# Patient Record
Sex: Female | Born: 1945 | Race: Black or African American | Hispanic: No | Marital: Married
Health system: Southern US, Community
[De-identification: ages and names within clinical notes are randomized; demographics above are authoritative.]

## PROBLEM LIST (undated history)

## (undated) ENCOUNTER — Ambulatory Visit: Admission: EM | Payer: Medicare PPO | Source: Home / Self Care

## (undated) DIAGNOSIS — Z87820 Personal history of traumatic brain injury: Secondary | ICD-10-CM

## (undated) DIAGNOSIS — M1990 Unspecified osteoarthritis, unspecified site: Secondary | ICD-10-CM

## (undated) DIAGNOSIS — R079 Chest pain, unspecified: Secondary | ICD-10-CM

## (undated) DIAGNOSIS — I319 Disease of pericardium, unspecified: Secondary | ICD-10-CM

## (undated) DIAGNOSIS — R899 Unspecified abnormal finding in specimens from other organs, systems and tissues: Secondary | ICD-10-CM

## (undated) DIAGNOSIS — R42 Dizziness and giddiness: Secondary | ICD-10-CM

## (undated) DIAGNOSIS — S060X9A Concussion with loss of consciousness of unspecified duration, initial encounter: Secondary | ICD-10-CM

## (undated) DIAGNOSIS — R51 Headache: Principal | ICD-10-CM

## (undated) DIAGNOSIS — G459 Transient cerebral ischemic attack, unspecified: Secondary | ICD-10-CM

## (undated) DIAGNOSIS — T4145XA Adverse effect of unspecified anesthetic, initial encounter: Secondary | ICD-10-CM

## (undated) DIAGNOSIS — I209 Angina pectoris, unspecified: Secondary | ICD-10-CM

## (undated) DIAGNOSIS — D649 Anemia, unspecified: Secondary | ICD-10-CM

## (undated) DIAGNOSIS — R413 Other amnesia: Secondary | ICD-10-CM

## (undated) DIAGNOSIS — T8859XA Other complications of anesthesia, initial encounter: Secondary | ICD-10-CM

## (undated) HISTORY — DX: Other amnesia: R41.3

## (undated) HISTORY — DX: Dizziness and giddiness: R42

## (undated) HISTORY — DX: Personal history of traumatic brain injury: Z87.820

## (undated) HISTORY — PX: TONSILLECTOMY: SUR1361

## (undated) HISTORY — DX: Unspecified abnormal finding in specimens from other organs, systems and tissues: R89.9

## (undated) HISTORY — DX: Chest pain, unspecified: R07.9

## (undated) HISTORY — PX: TUBAL LIGATION: SHX77

## (undated) HISTORY — PX: CYSTOCELE REPAIR: SHX163

## (undated) HISTORY — PX: APPENDECTOMY: SHX54

## (undated) HISTORY — DX: Headache: R51

## (undated) HISTORY — PX: COLONOSCOPY: SHX174

## (undated) HISTORY — PX: CERVICAL CONE BIOPSY: SUR198

## (undated) HISTORY — DX: Concussion with loss of consciousness of unspecified duration, initial encounter: S06.0X9A

---

## 1986-06-07 DIAGNOSIS — G459 Transient cerebral ischemic attack, unspecified: Secondary | ICD-10-CM

## 1986-06-07 HISTORY — DX: Transient cerebral ischemic attack, unspecified: G45.9

## 1998-01-14 ENCOUNTER — Ambulatory Visit (HOSPITAL_BASED_OUTPATIENT_CLINIC_OR_DEPARTMENT_OTHER): Admission: RE | Admit: 1998-01-14 | Discharge: 1998-01-14 | Payer: Self-pay | Admitting: Orthopaedic Surgery

## 1998-12-05 ENCOUNTER — Ambulatory Visit (HOSPITAL_COMMUNITY): Admission: RE | Admit: 1998-12-05 | Discharge: 1998-12-05 | Payer: Self-pay | Admitting: Internal Medicine

## 1999-07-08 ENCOUNTER — Other Ambulatory Visit: Admission: RE | Admit: 1999-07-08 | Discharge: 1999-07-08 | Payer: Self-pay | Admitting: Obstetrics and Gynecology

## 1999-11-24 ENCOUNTER — Ambulatory Visit (HOSPITAL_COMMUNITY): Admission: RE | Admit: 1999-11-24 | Discharge: 1999-11-24 | Payer: Self-pay | Admitting: Gastroenterology

## 1999-12-25 ENCOUNTER — Encounter: Admission: RE | Admit: 1999-12-25 | Discharge: 1999-12-25 | Payer: Self-pay | Admitting: Internal Medicine

## 1999-12-25 ENCOUNTER — Encounter: Payer: Self-pay | Admitting: Internal Medicine

## 2000-04-12 ENCOUNTER — Emergency Department (HOSPITAL_COMMUNITY): Admission: EM | Admit: 2000-04-12 | Discharge: 2000-04-13 | Payer: Self-pay | Admitting: Emergency Medicine

## 2000-04-12 ENCOUNTER — Encounter: Payer: Self-pay | Admitting: Emergency Medicine

## 2000-07-26 ENCOUNTER — Ambulatory Visit (HOSPITAL_COMMUNITY): Admission: RE | Admit: 2000-07-26 | Discharge: 2000-07-26 | Payer: Self-pay | Admitting: Internal Medicine

## 2000-07-26 ENCOUNTER — Encounter: Payer: Self-pay | Admitting: Internal Medicine

## 2000-08-30 ENCOUNTER — Encounter: Payer: Self-pay | Admitting: Internal Medicine

## 2000-08-30 ENCOUNTER — Encounter: Admission: RE | Admit: 2000-08-30 | Discharge: 2000-08-30 | Payer: Self-pay | Admitting: Internal Medicine

## 2000-10-04 ENCOUNTER — Other Ambulatory Visit: Admission: RE | Admit: 2000-10-04 | Discharge: 2000-10-04 | Payer: Self-pay | Admitting: Obstetrics and Gynecology

## 2001-08-08 ENCOUNTER — Ambulatory Visit (HOSPITAL_COMMUNITY): Admission: RE | Admit: 2001-08-08 | Discharge: 2001-08-08 | Payer: Self-pay | Admitting: Internal Medicine

## 2001-08-08 ENCOUNTER — Encounter: Payer: Self-pay | Admitting: Internal Medicine

## 2001-12-28 ENCOUNTER — Encounter: Payer: Self-pay | Admitting: Internal Medicine

## 2001-12-28 ENCOUNTER — Encounter: Admission: RE | Admit: 2001-12-28 | Discharge: 2001-12-28 | Payer: Self-pay | Admitting: Internal Medicine

## 2002-01-01 ENCOUNTER — Other Ambulatory Visit: Admission: RE | Admit: 2002-01-01 | Discharge: 2002-01-01 | Payer: Self-pay | Admitting: Obstetrics and Gynecology

## 2002-08-14 ENCOUNTER — Emergency Department (HOSPITAL_COMMUNITY): Admission: EM | Admit: 2002-08-14 | Discharge: 2002-08-15 | Payer: Self-pay | Admitting: Emergency Medicine

## 2003-01-01 ENCOUNTER — Ambulatory Visit (HOSPITAL_COMMUNITY): Admission: RE | Admit: 2003-01-01 | Discharge: 2003-01-01 | Payer: Self-pay | Admitting: Internal Medicine

## 2003-01-01 ENCOUNTER — Encounter: Payer: Self-pay | Admitting: Internal Medicine

## 2003-03-05 ENCOUNTER — Emergency Department (HOSPITAL_COMMUNITY): Admission: EM | Admit: 2003-03-05 | Discharge: 2003-03-06 | Payer: Self-pay | Admitting: *Deleted

## 2003-03-06 ENCOUNTER — Encounter: Payer: Self-pay | Admitting: Emergency Medicine

## 2003-06-19 ENCOUNTER — Encounter: Admission: RE | Admit: 2003-06-19 | Discharge: 2003-06-19 | Payer: Self-pay | Admitting: Obstetrics and Gynecology

## 2003-07-22 ENCOUNTER — Other Ambulatory Visit: Admission: RE | Admit: 2003-07-22 | Discharge: 2003-07-22 | Payer: Self-pay | Admitting: Obstetrics and Gynecology

## 2005-06-22 ENCOUNTER — Encounter: Admission: RE | Admit: 2005-06-22 | Discharge: 2005-06-22 | Payer: Self-pay | Admitting: Obstetrics and Gynecology

## 2005-09-08 ENCOUNTER — Encounter: Payer: Self-pay | Admitting: Vascular Surgery

## 2005-09-08 ENCOUNTER — Ambulatory Visit (HOSPITAL_COMMUNITY): Admission: RE | Admit: 2005-09-08 | Discharge: 2005-09-08 | Payer: Self-pay | Admitting: Internal Medicine

## 2006-05-05 ENCOUNTER — Ambulatory Visit: Payer: Self-pay | Admitting: Cardiology

## 2006-05-05 ENCOUNTER — Observation Stay (HOSPITAL_COMMUNITY): Admission: EM | Admit: 2006-05-05 | Discharge: 2006-05-06 | Payer: Self-pay | Admitting: Emergency Medicine

## 2006-05-06 ENCOUNTER — Encounter: Payer: Self-pay | Admitting: Cardiology

## 2006-05-16 ENCOUNTER — Ambulatory Visit: Payer: Self-pay | Admitting: Cardiology

## 2006-05-19 ENCOUNTER — Ambulatory Visit: Payer: Self-pay

## 2006-05-20 ENCOUNTER — Ambulatory Visit (HOSPITAL_COMMUNITY): Admission: RE | Admit: 2006-05-20 | Discharge: 2006-05-20 | Payer: Self-pay | Admitting: *Deleted

## 2006-05-23 ENCOUNTER — Ambulatory Visit: Payer: Self-pay | Admitting: Cardiology

## 2006-06-14 ENCOUNTER — Ambulatory Visit: Payer: Self-pay | Admitting: Internal Medicine

## 2007-03-02 ENCOUNTER — Encounter: Admission: RE | Admit: 2007-03-02 | Discharge: 2007-03-02 | Payer: Self-pay | Admitting: *Deleted

## 2007-04-18 ENCOUNTER — Encounter: Admission: RE | Admit: 2007-04-18 | Discharge: 2007-04-18 | Payer: Self-pay | Admitting: Obstetrics and Gynecology

## 2007-10-29 ENCOUNTER — Emergency Department (HOSPITAL_COMMUNITY): Admission: EM | Admit: 2007-10-29 | Discharge: 2007-10-30 | Payer: Self-pay | Admitting: Emergency Medicine

## 2007-11-14 ENCOUNTER — Emergency Department (HOSPITAL_COMMUNITY): Admission: EM | Admit: 2007-11-14 | Discharge: 2007-11-14 | Payer: Self-pay | Admitting: Emergency Medicine

## 2008-05-07 ENCOUNTER — Encounter: Admission: RE | Admit: 2008-05-07 | Discharge: 2008-05-07 | Payer: Self-pay | Admitting: Obstetrics and Gynecology

## 2009-01-15 ENCOUNTER — Ambulatory Visit: Payer: Self-pay | Admitting: Hematology & Oncology

## 2009-03-07 ENCOUNTER — Ambulatory Visit: Payer: Self-pay | Admitting: Hematology & Oncology

## 2009-03-10 LAB — CBC WITH DIFFERENTIAL (CANCER CENTER ONLY)
BASO#: 0 10*3/uL (ref 0.0–0.2)
BASO%: 0.5 % (ref 0.0–2.0)
EOS%: 1.1 % (ref 0.0–7.0)
Eosinophils Absolute: 0 10*3/uL (ref 0.0–0.5)
HCT: 35.7 % (ref 34.8–46.6)
HGB: 11.8 g/dL (ref 11.6–15.9)
LYMPH#: 1.7 10*3/uL (ref 0.9–3.3)
LYMPH%: 59.3 % — ABNORMAL HIGH (ref 14.0–48.0)
MCH: 27.2 pg (ref 26.0–34.0)
MCHC: 33.2 g/dL (ref 32.0–36.0)
MCV: 82 fL (ref 81–101)
MONO#: 0.2 10*3/uL (ref 0.1–0.9)
MONO%: 5.5 % (ref 0.0–13.0)
NEUT#: 0.9 10*3/uL — ABNORMAL LOW (ref 1.5–6.5)
NEUT%: 33.6 % — ABNORMAL LOW (ref 39.6–80.0)
Platelets: 321 10*3/uL (ref 145–400)
RBC: 4.35 10*6/uL (ref 3.70–5.32)
RDW: 14.6 % (ref 10.5–14.6)
WBC: 2.8 10*3/uL — ABNORMAL LOW (ref 3.9–10.0)

## 2009-03-10 LAB — CHCC SATELLITE - SMEAR

## 2009-03-12 LAB — PROTEIN ELECTROPHORESIS, SERUM
Albumin ELP: 53.5 % — ABNORMAL LOW (ref 55.8–66.1)
Alpha-1-Globulin: 6.4 % — ABNORMAL HIGH (ref 2.9–4.9)
Alpha-2-Globulin: 11.6 % (ref 7.1–11.8)
Beta 2: 4.6 % (ref 3.2–6.5)
Beta Globulin: 5.9 % (ref 4.7–7.2)
Gamma Globulin: 18 % (ref 11.1–18.8)
Total Protein, Serum Electrophoresis: 7.1 g/dL (ref 6.0–8.3)

## 2009-03-12 LAB — LACTATE DEHYDROGENASE: LDH: 154 U/L (ref 94–250)

## 2009-03-12 LAB — VITAMIN B12: Vitamin B-12: 1511 pg/mL — ABNORMAL HIGH (ref 211–911)

## 2009-03-12 LAB — ANA: Anti Nuclear Antibody(ANA): NEGATIVE

## 2009-07-11 ENCOUNTER — Ambulatory Visit: Payer: Self-pay | Admitting: Hematology & Oncology

## 2009-07-24 ENCOUNTER — Encounter: Admission: RE | Admit: 2009-07-24 | Discharge: 2009-07-24 | Payer: Self-pay | Admitting: Obstetrics and Gynecology

## 2009-08-12 ENCOUNTER — Ambulatory Visit: Payer: Self-pay | Admitting: Hematology & Oncology

## 2009-08-28 LAB — CBC WITH DIFFERENTIAL (CANCER CENTER ONLY)
BASO#: 0 10*3/uL (ref 0.0–0.2)
BASO%: 0.5 % (ref 0.0–2.0)
EOS%: 1.4 % (ref 0.0–7.0)
Eosinophils Absolute: 0 10*3/uL (ref 0.0–0.5)
HCT: 35.6 % (ref 34.8–46.6)
HGB: 11.5 g/dL — ABNORMAL LOW (ref 11.6–15.9)
LYMPH#: 1.6 10*3/uL (ref 0.9–3.3)
LYMPH%: 64.3 % — ABNORMAL HIGH (ref 14.0–48.0)
MCH: 26.6 pg (ref 26.0–34.0)
MCHC: 32.2 g/dL (ref 32.0–36.0)
MCV: 82 fL (ref 81–101)
MONO#: 0.2 10*3/uL (ref 0.1–0.9)
MONO%: 7.1 % (ref 0.0–13.0)
NEUT#: 0.7 10*3/uL — ABNORMAL LOW (ref 1.5–6.5)
NEUT%: 26.7 % — ABNORMAL LOW (ref 39.6–80.0)
Platelets: 290 10*3/uL (ref 145–400)
RBC: 4.31 10*6/uL (ref 3.70–5.32)
RDW: 13.9 % (ref 10.5–14.6)
WBC: 2.5 10*3/uL — ABNORMAL LOW (ref 3.9–10.0)

## 2009-08-28 LAB — CHCC SATELLITE - SMEAR

## 2009-11-10 ENCOUNTER — Ambulatory Visit: Payer: Self-pay | Admitting: Hematology & Oncology

## 2009-11-12 LAB — CBC WITH DIFFERENTIAL (CANCER CENTER ONLY)
BASO#: 0 10*3/uL (ref 0.0–0.2)
BASO%: 0.3 % (ref 0.0–2.0)
EOS%: 2 % (ref 0.0–7.0)
Eosinophils Absolute: 0.1 10*3/uL (ref 0.0–0.5)
HCT: 34.9 % (ref 34.8–46.6)
HGB: 11.6 g/dL (ref 11.6–15.9)
LYMPH#: 1.5 10*3/uL (ref 0.9–3.3)
LYMPH%: 59.4 % — ABNORMAL HIGH (ref 14.0–48.0)
MCH: 27.5 pg (ref 26.0–34.0)
MCHC: 33.1 g/dL (ref 32.0–36.0)
MCV: 83 fL (ref 81–101)
MONO#: 0.1 10*3/uL (ref 0.1–0.9)
MONO%: 5.1 % (ref 0.0–13.0)
NEUT#: 0.9 10*3/uL — ABNORMAL LOW (ref 1.5–6.5)
NEUT%: 33.2 % — ABNORMAL LOW (ref 39.6–80.0)
Platelets: 307 10*3/uL (ref 145–400)
RBC: 4.21 10*6/uL (ref 3.70–5.32)
RDW: 13.6 % (ref 10.5–14.6)
WBC: 2.6 10*3/uL — ABNORMAL LOW (ref 3.9–10.0)

## 2009-11-12 LAB — CHCC SATELLITE - SMEAR

## 2010-01-29 ENCOUNTER — Telehealth: Payer: Self-pay | Admitting: Internal Medicine

## 2010-01-30 ENCOUNTER — Ambulatory Visit: Payer: Self-pay | Admitting: Internal Medicine

## 2010-02-02 ENCOUNTER — Encounter: Payer: Self-pay | Admitting: Internal Medicine

## 2010-05-11 ENCOUNTER — Ambulatory Visit: Payer: Self-pay | Admitting: Hematology & Oncology

## 2010-06-29 ENCOUNTER — Encounter: Payer: Self-pay | Admitting: Obstetrics and Gynecology

## 2010-07-07 NOTE — Progress Notes (Signed)
Summary: having chest discomfort  Phone Note Call from Patient   Caller: Patient Reason for Call: Talk to Nurse Summary of Call: pt has an appt w dr Tenny Craw 9-9-but since sunday has had some chest discomfort, and when she takes a deep breath has pain-pls advise [phone] Initial call taken by: Glynda Jaeger,  January 29, 2010 3:41 PM  Follow-up for Phone Call        Called patient back...she is complaining of chest pain on and off  sometimes worse with deep inspiration since Sunday and would like to be seen sooner than 9/9. She states that she has a history of "inflammation of the heart" and was seen by cardiology several years ago. I moved up her appointment to tomorrow with Dr.Alveena Taira and cancelled the 9/9 appointment.Marland KitchenMarland KitchenMarland KitchenDiane Simko aware.  Follow-up by: Suzan Garibaldi RN

## 2010-07-07 NOTE — Assessment & Plan Note (Signed)
Summary: chest tightness/discomfort   Visit Type:  Follow-up   History of Present Illness: Patient is a 65 year old with a history of atypcal chest pain. SHe was last in clinic in 2008 [facility] called yesterday complaining of chest pain.   Sunday night she complained of a severe episode of chest pain that occurred in the evening   Not associated with acitvity.  Went away in about 20 min.  Not pleuritic that she could tell. Monday she was ok.  Started pilates.  Since then,esp since Wed she has had the development of SS schest pressure that is worse with inspiration. No cough, no fever.  No increase in reflux. Note she began a cleansing diet last week. Note she had neg stress test a few years ago.  Current Medications (verified): 1)  Vit D3 .... 1tab Qd 2)  Vitamin B Super .... 1tabqd 3)  Flax   Oil (Flaxseed (Linseed)) .... 1tabbqd 4)  Multivitamins   Tabs (Multiple Vitamin) .... 1tabqd 5)  Womens Transition Menopause .... Daily 6)  Florasource Probotic .... Daily 7)  Omega 3 .... Daily  Allergies (verified): No Known Drug Allergies  Past History:  Past Medical History: Chest pain.  Past Surgical History: Tubal ligation Appendectomy  Family History: Father with history of CAD.  Died of CHF at 82.   Mother died of CA at 72  Social History: Married.   No smoking.  No ETOH  Vital Signs:  Patient profile:   64 year old female Height:      66 inches Weight:      189 pounds BMI:     30 .62 Pulse rate:   83 / minute BP sitting:   106 / 77  (left arm) Cuff size:   large  Vitals Entered By: Burnett Kanaris, CNA (January 30, 2010 9:21 AM)  Nutrition Counseling: Patient's BMI is greater than 25 and therefore counseled on weight management options.  Physical Exam  Additional Exam:  Patient is in NAD. HEENT:  Normocephalic, atraumatic. EOMI, PERRLA.  Neck: JVP is normal. No thyromegaly. No bruits.  Lungs: clear to auscultation. No rales no wheezes.  CHest:  tender to  palpiation.  Brings on pain she has been experiencing since Wed. Heart: Regular rate and rhythm. Normal S1, S2. No S3.   No significant murmurs. PMI not displaced.  Abdomen:  Supple, nontender. Normal bowel sounds. No masses. No hepatomegaly.  Extremities:   Good distal pulses throughout. No lower extremity edema.  Musculoskeletal :moving all extremities.  Neuro:   alert and oriented x3.    EKG  Procedure date:  01/30/2010  Findings:      NSR.  78 bpm.  Impression & Recommendations:  Problem # 1:  OTHER CHEST PAIN (ICD-786.59) Patient has no history of CAD.  Patient has had 2 types of chst pain.  One occurred Sunday was severe.  Not assoc with any activity.  Has not had any recurrence.  I would follow.  I am not sure what it represents but I am not convinced it is cardiac. The second type began Wednesday and appears to be musculoskeletal.  She may have strained something with pilates. I have instructed her to not do pilates for a week.  She can take 400 Motrin two times a day.   F/U only if symptoms change or worsen.  Problem # 2:  HEALTH SCREENING (ICD-V70.0) Lipid panel excellent a few years ago.  Other Orders: EKG w/ Interpretation (93000)

## 2010-08-12 ENCOUNTER — Other Ambulatory Visit: Payer: Self-pay | Admitting: Obstetrics and Gynecology

## 2010-08-12 DIAGNOSIS — Z1231 Encounter for screening mammogram for malignant neoplasm of breast: Secondary | ICD-10-CM

## 2010-08-19 ENCOUNTER — Ambulatory Visit
Admission: RE | Admit: 2010-08-19 | Discharge: 2010-08-19 | Disposition: A | Discharge status: Home / Self Care | Payer: BC Managed Care – PPO | Source: Ambulatory Visit | Attending: Obstetrics and Gynecology | Admitting: Obstetrics and Gynecology

## 2010-08-19 DIAGNOSIS — Z1231 Encounter for screening mammogram for malignant neoplasm of breast: Secondary | ICD-10-CM

## 2010-10-23 NOTE — Assessment & Plan Note (Signed)
[facility]                                 ON-CALL NOTE   NAME:[name], [name]                      MRN:          [id number]  DATE:05/10/2006                            DOB:          [birth year]    IDENTIFICATION:  [name] is a woman whom I have spoken with a  couple of times now since her discharge from the hospital on Friday.  She has chest pain which is atypical pleuritic.   She called today and said she is having still continuous 1/10 pain.  Again, it is worse with inspiration.  The patient actually informs me  today that she has an appointment later this afternoon with Dr. Mikey Bussing  at [place], [facility].  I encouraged her to keep this and has already  been in touch with the doctor to let her know what has gone on over the  past few days.     Pricilla Riffle, MD, [facility]  Electronically Signed    PVR/MedQ  DD: 05/10/2006  DT: 05/10/2006  Job #: [id number]

## 2010-10-23 NOTE — Assessment & Plan Note (Signed)
[facility]                            CARDIOLOGY OFFICE NOTE   NAME:[name], Joan                      MRN:          [id number]  DATE:06/14/2006                            DOB:          August 16, 1945    IDENTIFICATION:  [name] is a 65 year old woman whom I saw in the  hospital actually back in December.  She was admitted to [facility] with chest pain.  I felt it was atypical, pleuritic at times,  not associated with exertion.  I told her to take Motrin for a couple of  days to see if it improved.  I followed up on the phone.  She was  continuing to have some problems.  Note, she was due to see Dr.  Particia Jasper at [place], [facility] and she kept the appointment.   In the interval, since I saw her, she actually was seen in cardiology  clinic on December 10.  She also was complaining of some headaches.  She  went ahead and had the Myoview scan done, continued on the Motrin for a  while.  Also set up for a head CT with the headaches.   Myoview scan was done on December 12.  This showed normal perfusion with  mild apical thinning.  EF of 66%.  Head CT was non-contrasted and was  negative.   Since seen, she is now feeling better.  Denies chest pain, headache is  gone.  She took herself off the Motrin and aspirin.  She is resuming her  activities.   CURRENT MEDICATIONS:  __________ .  Life Source vitamins.  Flax seed  oil.  Liquid vitamins and minerals.   PHYSICAL EXAM:  The patient is in no distress.  Blood pressure 132/78, pulse is 60, weight 190.  LUNGS:  Clear.  CARDIAC:  Regular rate and rhythm.  S1, S2.  No S3.  No murmurs.  ABDOMEN:  Benign.  EXTREMITIES:  No edema.  Good distal pulses.   IMPRESSION:  1. Chest pain.  I think again it was probably more muscular +/-      pleuritic in nature.  No evidence of coronary ischemia.  Resolved.      Will not work up further.  2. Health care maintenance.  The patient went through  extensive      evaluation for this chest pain.  Luckily, she is feeling well.   Review of her labs, her hemoglobin A1C was good at 5.9, lipids were  great with LDL of 85 and HDL of 56.   I encouraged her to stay active in a regular walking program.  I will  set followup only p.r.n. if her symptoms change.  The patient was given  copies of her lab results.     Pricilla Riffle, MD, [facility]  Electronically Signed    PVR/MedQ  DD: 06/23/2006  DT: 06/23/2006  Job #: [id number]   cc:   Joan Kinds, MD

## 2010-10-23 NOTE — Op Note (Signed)
[place]  Patient:    [name], [name]                      MRN: [id number] Proc. Date: 11/24/99 Adm. Date:  [id number] Disc. Date: [id number] Attending:  Charna Elizabeth CC:         Sheronette A. Cherly Hensen, M.D.             Norva Pavlov, M.D.                           Operative Report  DATE OF BIRTH:  1945-10-15  REFERRING PHYSICIAN:  Sheronette A. Cherly Hensen, M.D.  PROCEDURE PERFORMED:  Colonoscopy.  ENDOSCOPIST:  Anselmo Rod, M.D.  INSTRUMENT:  Olympus video colonoscope.  INDICATION FOR PROCEDURE:  Blood in stool in a 64 year old black female Rule out for colonic polyps, masses, hemorrhoids, etc.  PREPROCEDURE PREPARATION:  Informed consent was procured from the patient. The patient was fasting for eight hours prior to the procedure and prepped with a bottle of magnesium citrate and a gallon of NuLYTELY the night prior to the procedure.  PREPROCEDURE PHYSICAL:  VITAL SIGNS:  Stable.  NECK:  Supple.  CHEST:  Clear to auscultation.  HEART:  S1, S2 regular.  ABDOMEN:  Soft with normal abdominal bowel sounds.  DESCRIPTION OF THE PROCEDURE:  The patient was placed in the left lateral decubitus position and sedated with 25 mg of Demerol and 2 mg of Versed intravenously.  Once the patient was adequately sedated and maintained on low-flow oxygen and continuous cardiac monitoring, the Olympus video colonoscope was advanced from the rectum to the cecum without difficulty.  No masses, polyps, erosions, ulcerations, or diverticula were seen.  The patient has a healthy-appearing colon.  She tolerated the procedure well without complication.  IMPRESSION:  Normal colonoscopy.  RECOMMENDATIONS:  The patient has been advised to follow up in the office for repeat guaiac testing.  A high-fiber diet has been discussed with her in great detail, and brochures have been given to help education.  Further recommendations will be made on her  next followup visit. DD:  11/24/99 TD:  11/26/99 Job: [id number]

## 2010-10-23 NOTE — Discharge Summary (Signed)
NAMENICHOL, ATOR             ACCOUNT NO.:  [id number]   MEDICAL RECORD NO.:  [id number]          PATIENT TYPE:  INP   LOCATION:  1431                         FACILITY:  [facility]   PHYSICIAN:  Isidor Holts, M.D.  DATE OF BIRTH:  03/14/1946   DATE OF ADMISSION:  05/05/2006  DATE OF DISCHARGE:  05/06/2006                               DISCHARGE SUMMARY   PRIMARY CARE PHYSICIAN:  Unassigned.   DISCHARGE DIAGNOSIS:  Atypical chest pain.   DISCHARGE MEDICATIONS:  1. Aspirin enteric-coated 81 mg p.o. daily.  2. Nitroglycerin 0.4 mg SL p.r.n. q. 5 m.   PROCEDURES:  1. Chest x-ray dated May 05, 2006.  This showed bibasilar      atelectasis Vs airspace disease, left greater than right.  2. Chest CT angiogram dated May 06, 2006.  This showed no      evidence of pulmonary embolus.  Bibasilar atelectasis, left greater      than right.  3. 2 D echocardiogram dated May 06, 2006.  This showed overall      normal left ventricular systolic function.  EF 60%.  No diagnostic      evidence of left ventricular regional wall motion abnormalities.      Normal aortic valve, normal mitral valve.  No pericardial effusion.   CONSULTATIONS:  Dr. Dietrich Pates, cardiologist.   ADMISSION HISTORY:  As in H&P notes of May 05, 2006, dictated by  Dr. Jamison Oka. However, in brief, this is a rather pleasant 65-  year-old female, with no significant past medical history other than  appendectomy and tubal ligation, who presents with a 1 day history of  nagging sternal pain without any radiation, aggravated by movement and  deep inspiration.  She eventually came to the emergency department where  relief was obtained by a combination of aspirin and nitroglycerin.  She  was admitted for further evaluation, investigation, and management.   CLINICAL COURSE:  1. Chest pain.  This is atypical in nature, described as a nagging      pain in the sternal region aggravated by certain kinds of  movements      and also by deep inspiration.  No associated cough.  No      diaphoresis.  No nausea or shortness of breath.  Cardiac enzymes      are cycled, remain unelevated.  EKG showed no acute ischemic      changes.  Lipid profile was excellent with total cholesterol of      152, triglycerides 54, HDL 56, LDL 85.  On suspicion of possible      pulmonary embolism given the fact that the patient recently went on      a road trip to [place] for the Thanksgiving holidays, chest CT      angiogram was done on May 06, 2006, which was negative for      pulmonary embolism.  Also, 2 D echocardiogram showed normal LV      function.  For details, refer to procedure list above.  Cardiology      consultation was called, which was kindly provided by Dr. Gunnar Fusi  Ross.   Per Cardiology recommendations, the patient could benefit from stress  testing.  This has been arranged on an outpatient basis.  As of May 06, 2006, the patient was quite asymptomatic apart from a twinge of  retrosternal pain.   DISPOSITION:  Provided there are no objections from cardiology,  discharge is anticipated on May 06, 2006.   DIET:  Healthy heart diet.   ACTIVITY:  As tolerated.   WOUND CARE:  Not applicable.   PAIN MANAGEMENT:  Not applicable.   FOLLOW UP INSTRUCTIONS:  The patient is to follow up with Dr. Tenny Craw' PA,  telephone number [phone], on May 16, 2006, at 10:30 a.m.  She has  been instructed to establish a primary MD for routine and preventative  care.  Stress Myoview is planned on May 13, 2006, at 10 a.m.  The  patient has been instructed to have nothing to eat after midnight of  May 12, 2006.  These instructions have been communicated to the  patient, and she has verbalized understanding.      Isidor Holts, M.D.  Electronically Signed     CO/MEDQ  D:  05/06/2006  T:  05/06/2006  Job:  [id number]   cc:   Pricilla Riffle, MD, [facility]  [address]  [address]   [place]  [place] [address]

## 2010-10-23 NOTE — H&P (Signed)
NAMECHRISTI, [name]             ACCOUNT NO.:  [id number]   MEDICAL RECORD NO.:  [id number]          PATIENT TYPE:  EMS   LOCATION:  ED                           FACILITY:  [facility]   PHYSICIAN:  Mobolaji B. Bakare, M.D.DATE OF BIRTH:  02-Sep-1945   DATE OF ADMISSION:  05/05/2006  DATE OF DISCHARGE:                              HISTORY & PHYSICAL   CHIEF COMPLAINT:  Chest discomfort.   HISTORY OF PRESENTING COMPLAINT:  [name] is a pleasant 65-year-  old African American female who was in her usual state of health until  this morning at about 5 a.m. when she woke up with chest discomfort.  This was kind of a nagging pain which was sternal.  Does not feel like  heartburn.  This did not stop her from getting dressed and going to  work.  She drove to work and the pain increased with driving and  activity.  There was no radiation.  The pain gets worse with changing  position like bending over.  She did not feel nauseous.  No vomiting.  She had an episode associated with shortness of breath and this did not  last long.  There is no palpitations and no diaphoresis.  This pain was  constant and persistent.  It was 8/10 at its highest.  The patient was  brought to the emergency room by her husband.  She had to leave work  early.  The pain improved with a nitroglycerin patch and aspirin in the  emergency room.  The initial EKG was remarkable for ST elevation,  ischemic changes.  The first set of cardiac markers were unremarkable.   The patient does not smoke cigarettes.  She does not have a history of  diabetes mellitus, hypertension, or hyperlipidemia.  No family history  of premature coronary artery disease.   PAST MEDICAL HISTORY:  None.   PAST SURGICAL HISTORY:  Appendectomy, tubal ligation.   CURRENT MEDICATIONS:  None.   ALLERGIES:  No known drug allergies.   FAMILY HISTORY:  Both parents are deceased.  Father passed away at the  age of 50 from complications of gallbladder  surgery.  Mother passed away  in her 70s.  The patient has one sister who is well and alive.   SOCIAL HISTORY:  She does not smoke cigarettes nor drink alcohol.  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   PHYSICAL EXAMINATION:  VITAL SIGNS:  Temperature 97.9, blood pressure 134/77, pulse 86,  respiratory rate 16, O2 saturation 97%.  GENERAL:  The patient is comfortable not in respiratory distress.  HEENT:  Normocephalic, atraumatic.  Pupils equal and reactive to light.  Extraocular movements intact.  Mucous membranes moist.  No elevated JVD,  no carotid bruit.  LUNGS:  Clear clinically to auscultation.  HEART:  S1 and S2, regular.  ABDOMEN:  Soft, nontender, nondistended, no palpable organomegaly, bowel  sounds present.  EXTREMITIES:  No pedal edema or calf tenderness.  Dorsalis pedes pulses  2+ bilaterally.  NEUROLOGICAL:  No focal neurological deficit.   INITIAL LABORATORY DATA:  White count 3.8, hemoglobin 12.5,  hematocrit  38.3, platelets 287, normal differential.  Sodium 142, potassium 3.8,  chloride 107, bicarb 30, glucose 86, BUN 17, creatinine 0.7, calcium  9.6.  Protime 12.2, INR 0.9.  CK MB 1.6, troponin I less than 0.05.  D-  dimer 0.23.  Chest x-ray pending.   ASSESSMENT/PLAN:  [name] is a 65 year old African American female  presenting with atypical chest pain which was relieved in the emergency  room  by nitroglycerin and aspirin.  The chest pain has reduced from 8  to 2.  The first set of cardiac markers point of care is normal.  EKG is  normal.  The patient is to be admitted to telemetry and treat for  unstable angina, cycle cardiac enzymes q.8h. x3, obtain fasting lipid  profile, hemoglobin A1C, TSH.  Aspirin 325 mg p.o. daily, Lopressor 12.5  mg p.o. b.i.d., nitroglycerin infusion at 5 mcg, Lovenox 1 mg per kg  body weight.  If the patient rules out for MI, we will do stress test to  further evaluate.  2D echocardiogram in a.m.  Protonix 40  mg p.o. daily.      Mobolaji B. Corky Downs, M.D.  Electronically Signed     MBB/MEDQ  D:  05/05/2006  T:  05/05/2006  Job:  [id number]

## 2010-10-23 NOTE — Consult Note (Signed)
[name], SCHUESSLER             ACCOUNT NO.:  [id number]   MEDICAL RECORD NO.:  [id number]          PATIENT TYPE:  OBV   LOCATION:  1431                         [facility]AC[facility]   PHYSICIAN:  Pricilla Riffle, MD, FACCDATE OF BIRTH:  December 11, 1945   DATE OF CONSULTATION:  05/06/2006  DATE OF DISCHARGE:  05/06/2006                                 CONSULTATION   REASON FOR CONSULTATION:  I was asked to see her regarding chest pain.   HISTORY OF PRESENT ILLNESS:  The patient has no known history of  coronary artery disease.  She was admitted on 11/29.  She had woken up  when she began noticing chest discomfort, a squeezing tightness,  question achiness in the left shoulder, 8/10 in intensity.  No  associated shortness of breath.  Hurt to take a deep breath.  Returned  home from work and then presented to the [employer].  In  the ER and since admission, she has had persistent decrease in her chest  pain with nitroglycerin; now 1/10 in intensity, with 0/10 last p.m.  She  denies any exertional limitations.  No productive cough.  Has occasional  reflux.   ALLERGIES:  None.   MEDICATIONS:  Prior to admission herbal medications.  No recent change.   PAST MEDICAL HISTORY:  Negative.   SOCIAL HISTORY:  The patient lives in Home, is married and does  not smoke and does not drink.   FAMILY HISTORY:  History of high cholesterol in sibling, otherwise no  history of CAD.   REVIEW OF SYSTEMS:  All systems reviewed, negative for the above problem  except as noted.   PHYSICAL EXAMINATION:  GENERAL:  The patient was in no acute distress.  VITAL SIGNS:  Blood pressure 115/56, pulse 77, temperature 98.5.  HEENT:  Normocephalic, atraumatic.  EOMI.  PERL.  Throat clear.  Dentition good.  NECK:  JVP is normal.  No bruits.  No thyromegaly.  LUNGS:  Clear to auscultation.  CARDIAC EXAM:  Regular rate and rhythm.  S1, S2, no S3, S4, murmurs or  rubs.  ABDOMEN:  Supple,  nontender.  No hepatomegaly.  EXTREMITIES:  2+ distal pulses no edema.  NEUROLOGIC:  Alert and oriented x3.  Cranial nerves II-XII intact.  Motor 5/5 throughout.   CHEST X-RAY:  Bilateral basilar atelectasis versus air-space disease,  left greater than right.   CHEST CT:  Shows basilar atelectasis.   EKG:  Normal sinus rhythm.  No acute ST changes.   LABS:  Significant for hemoglobin of 12.5, WBC of 3.8, BUN and  creatinine of 17 and 0.7, potassium of 3.8.  CK/MB negative x2.  Troponin negative x2.  Hemoglobin A1c 5.9.  TSH 0.67.   IMPRESSION:  The patient has a history of chest pain that appears very  atypical.  It is pleuritic and prolonged.  She has ruled out for MI.  Workup is negative.   Overall feel low-risk for discharge.  Would treat with aspirin and  nitroglycerin as needed.  Again, feel that it may be possible viral  etiology.  Would contact the patient Monday  if pain continues with  possible outpatient Myoview.  Again, take activity as tolerated over the  weekend.      Pricilla Riffle, MD, [facility]  Electronically Signed     PVR/MEDQ  D:  05/19/2006  T:  05/19/2006  Job:  [id number]

## 2010-11-12 ENCOUNTER — Encounter (HOSPITAL_BASED_OUTPATIENT_CLINIC_OR_DEPARTMENT_OTHER): Payer: BC Managed Care – PPO | Admitting: Hematology & Oncology

## 2010-11-12 ENCOUNTER — Other Ambulatory Visit: Payer: Self-pay | Admitting: Hematology & Oncology

## 2010-11-12 DIAGNOSIS — D72819 Decreased white blood cell count, unspecified: Secondary | ICD-10-CM

## 2010-11-12 LAB — CBC WITH DIFFERENTIAL (CANCER CENTER ONLY)
BASO#: 0.1 10*3/uL (ref 0.0–0.2)
BASO%: 2.7 % — ABNORMAL HIGH (ref 0.0–2.0)
EOS%: 0.4 % (ref 0.0–7.0)
Eosinophils Absolute: 0 10*3/uL (ref 0.0–0.5)
HCT: 35.5 % (ref 34.8–46.6)
HGB: 11.2 g/dL — ABNORMAL LOW (ref 11.6–15.9)
LYMPH#: 1.5 10*3/uL (ref 0.9–3.3)
LYMPH%: 54.9 % — ABNORMAL HIGH (ref 14.0–48.0)
MCH: 25.8 pg — ABNORMAL LOW (ref 26.0–34.0)
MCHC: 31.5 g/dL — ABNORMAL LOW (ref 32.0–36.0)
MCV: 82 fL (ref 81–101)
MONO#: 0.3 10*3/uL (ref 0.1–0.9)
MONO%: 10.6 % (ref 0.0–13.0)
NEUT#: 0.8 10*3/uL — ABNORMAL LOW (ref 1.5–6.5)
NEUT%: 31.4 % — ABNORMAL LOW (ref 39.6–80.0)
Platelets: 310 10*3/uL (ref 145–400)
RBC: 4.34 10*6/uL (ref 3.70–5.32)
RDW: 16.2 % — ABNORMAL HIGH (ref 11.1–15.7)
WBC: 2.6 10*3/uL — ABNORMAL LOW (ref 3.9–10.0)

## 2010-11-12 LAB — CHCC SATELLITE - SMEAR

## 2011-09-14 ENCOUNTER — Other Ambulatory Visit: Payer: Self-pay | Admitting: Internal Medicine

## 2011-09-14 DIAGNOSIS — Z1231 Encounter for screening mammogram for malignant neoplasm of breast: Secondary | ICD-10-CM

## 2011-10-27 ENCOUNTER — Ambulatory Visit: Payer: Medicare Other

## 2011-12-28 ENCOUNTER — Ambulatory Visit
Admission: RE | Admit: 2011-12-28 | Discharge: 2011-12-28 | Disposition: A | Discharge status: Home / Self Care | Payer: Medicare Other | Source: Ambulatory Visit | Attending: Internal Medicine | Admitting: Internal Medicine

## 2011-12-28 DIAGNOSIS — Z1231 Encounter for screening mammogram for malignant neoplasm of breast: Secondary | ICD-10-CM

## 2012-03-03 ENCOUNTER — Encounter: Payer: Self-pay | Admitting: Internal Medicine

## 2013-02-12 ENCOUNTER — Emergency Department (HOSPITAL_COMMUNITY): Payer: Medicare Other

## 2013-02-12 ENCOUNTER — Encounter (HOSPITAL_COMMUNITY): Payer: Self-pay | Admitting: Cardiology

## 2013-02-12 ENCOUNTER — Emergency Department (HOSPITAL_COMMUNITY)
Admission: EM | Admit: 2013-02-12 | Discharge: 2013-02-12 | Disposition: A | Discharge status: Home / Self Care | Payer: Medicare Other | Attending: Emergency Medicine | Admitting: Emergency Medicine

## 2013-02-12 DIAGNOSIS — R0789 Other chest pain: Secondary | ICD-10-CM | POA: Insufficient documentation

## 2013-02-12 DIAGNOSIS — Z79899 Other long term (current) drug therapy: Secondary | ICD-10-CM | POA: Insufficient documentation

## 2013-02-12 LAB — BASIC METABOLIC PANEL
BUN: 16 mg/dL (ref 6–23)
CO2: 25 mEq/L (ref 19–32)
Calcium: 9.9 mg/dL (ref 8.4–10.5)
Chloride: 104 mEq/L (ref 96–112)
Creatinine, Ser: 0.68 mg/dL (ref 0.50–1.10)
GFR calc Af Amer: >90 mL/min (ref >90)
GFR calc non Af Amer: 89 mL/min — ABNORMAL LOW (ref >90)
Glucose, Bld: 85 mg/dL (ref 70–99)
Potassium: 5.3 mEq/L — ABNORMAL HIGH (ref 3.5–5.1)
Sodium: 138 mEq/L (ref 135–145)

## 2013-02-12 LAB — CBC
HCT: 35.3 % — ABNORMAL LOW (ref 36.0–46.0)
Hemoglobin: 11.1 g/dL — ABNORMAL LOW (ref 12.0–15.0)
MCH: 26.1 pg (ref 26.0–34.0)
MCHC: 31.4 g/dL (ref 30.0–36.0)
MCV: 83.1 fL (ref 78.0–100.0)
Platelets: 249 10*3/uL (ref 150–400)
RBC: 4.25 MIL/uL (ref 3.87–5.11)
RDW: 17.1 % — ABNORMAL HIGH (ref 11.5–15.5)
WBC: 2.6 10*3/uL — ABNORMAL LOW (ref 4.0–10.5)

## 2013-02-12 LAB — POCT I-STAT TROPONIN I: Troponin i, poc: 0 ng/mL (ref 0.00–0.08)

## 2013-02-12 MED ORDER — KETOROLAC TROMETHAMINE 30 MG/ML IJ SOLN
30.0000 mg | Freq: Once | INTRAMUSCULAR | Status: AC
  Administered 2013-02-12: 30 mg via INTRAVENOUS
  Filled 2013-02-12: qty 1

## 2013-02-12 MED ORDER — ASPIRIN EC 325 MG PO TBEC
325.0000 mg | DELAYED_RELEASE_TABLET | Freq: Once | ORAL | Status: AC
  Administered 2013-02-12: 325 mg via ORAL
  Filled 2013-02-12: qty 1

## 2013-02-12 NOTE — ED Notes (Signed)
Pt reports pain in the top part of her chest. States that it has been there a couple of weeks, but she recently started a new work out routine. Denies any SOB or N/v, but does report pressure. No cardiac hx.

## 2013-02-12 NOTE — ED Provider Notes (Signed)
CSN: [id number]     Arrival date & time 02/12/13  1445 History   First MD Initiated Contact with Patient 02/12/13 1652     Chief Complaint  Patient presents with  . Chest Pain   (Consider location/radiation/quality/duration/timing/severity/associated sxs/prior Treatment) HPI This is a 67 year old female with no significant past medical history 2 presents with chest pain. The patient reports 2 weeks of chest "soreness" and tightness. She states that the soreness comes and goes. She started a new exercise routine 2 weeks ago and thinks this may have contributed to her symptoms. Her pain is not worse with exercising. She states that Saturday night she had continuous pain all night. She does not like to take medication and has not taken anything for pain.  She denies any shortness of breath or diaphoresis. She states that the pain is worse when she coughs or sneezes. It is also worse with movement. She states that she had similar pain several years ago and was evaluated. Chart reveals evaluation in 2007 for atypical chest pain. She was referred for outpatient stress testing at that time. Patient denies any fevers, cough, headache, abdominal pain, urinary symptoms, focal weakness or numbness. Past Medical History  Diagnosis Date  . Chest pain    Past Surgical History  Procedure Laterality Date  . Tubal ligation    . Appendectomy     Family History  Problem Relation Age of Onset  . Coronary artery disease    . Heart disease Father   . Heart failure Father    History  Substance Use Topics  . Smoking status: Never Smoker   . Smokeless tobacco: Not on file  . Alcohol Use: No   OB History   Grav Para Term Preterm Abortions TAB SAB Ect Mult Living                 Review of Systems  Constitutional: Negative for fever.  Respiratory: Positive for chest tightness. Negative for cough and shortness of breath.   Cardiovascular: Positive for chest pain. Negative for palpitations and leg swelling.   Gastrointestinal: Negative for nausea, vomiting and abdominal pain.  Genitourinary: Negative for dysuria.  Musculoskeletal: Negative for back pain.  Skin: Negative for rash.  Neurological: Negative for dizziness and syncope.  Psychiatric/Behavioral: The patient is not nervous/anxious.   All other systems reviewed and are negative.    Allergies  Review of patient's allergies indicates no known allergies.  Home Medications   Current Outpatient Rx  Name  Route  Sig  Dispense  Refill  . Ascorbic Acid (VITAMIN C) 1000 MG tablet   Oral   Take 1,000 mg by mouth daily.         . Cholecalciferol (VITAMIN D-3 PO)   Oral   Take by mouth.         . cyanocobalamin 1000 MCG tablet   Oral   Take 100 mcg by mouth daily.         . Flaxseed, Linseed, (FLAX SEEDS PO)   Oral   Take by mouth.         . Nutritional Supplements (WOMENS FORMULA MENOPAUSE PO)   Oral   Take by mouth.          BP 103/61  Pulse 72  Temp(Src) 97.6 F (36.4 C) (Oral)  Resp 17  Ht 5\' 6"  (1.676 m)  Wt 162 lb (73.483 kg)  BMI 26.16 kg/m2  SpO2 100% Physical Exam  Nursing note and vitals reviewed. Constitutional: She is oriented to  person, place, and time. She appears well-developed and well-nourished.  HENT:  Head: Normocephalic and atraumatic.  Eyes: Pupils are equal, round, and reactive to light.  Neck: Neck supple.  Cardiovascular: Normal rate, regular rhythm and normal heart sounds.   Pulmonary/Chest: Effort normal and breath sounds normal. No respiratory distress. She has no wheezes. She exhibits tenderness.  Tenderness to palpation of the anterior chest wall  Abdominal: Soft. Bowel sounds are normal. There is no tenderness.  Musculoskeletal: She exhibits no edema.  Neurological: She is alert and oriented to person, place, and time.  Skin: Skin is warm and dry.  Psychiatric: She has a normal mood and affect.    ED Course  Procedures (including critical care time) Labs Review Labs  Reviewed  CBC - Abnormal; Notable for the following:    WBC 2.6 (*)    Hemoglobin 11.1 (*)    HCT 35.3 (*)    RDW 17.1 (*)    All other components within normal limits  BASIC METABOLIC PANEL - Abnormal; Notable for the following:    Potassium 5.3 (*)    GFR calc non Af Amer 89 (*)    All other components within normal limits  POCT I-STAT TROPONIN I   Imaging Review Dg Chest 2 View  02/12/2013   *RADIOLOGY REPORT*  Clinical Data: Chest pain  CHEST - 2 VIEW  Comparison: Chest radiograph 05/05/2006  Findings: Stable cardiac and mediastinal contours.  Minimal basilar atelectasis and/or scarring.  No consolidative pulmonary opacities. No pleural effusion or pneumothorax.  Regional skeleton is unremarkable.  IMPRESSION: No acute cardiopulmonary process.   Original Report Authenticated By: Annia Belt, M.D   EKG independently reviewed by myself:  Normal sinus rhythm with a rate of 78, no evidence of ST elevation or ischemia  MDM   1. Atypical chest pain    This is a 67 year old female who presents with 2 weeks of chest pain. She is nontoxic-appearing on exam and her vital signs are within normal limits. Chest pain is reproducible on exam. Patient was given full dose aspirin and Toradol. Basic labwork was obtained. Lab work notable for a potassium of 5.3 with hemolysis. Troponin is negative. Chest x-ray is negative. Patient's pain was improved with Toradol. Chart review reveals that she was worked up in 2007 with an inpatient stay and outpatient stress by cardiology. Stress test results were not obtainable. At this time I feel her pain is very atypical for ACS given that it is reproducible and has been one over two weeks. Given her age, however, I feel that she does warrant outpatient workup and stress testing. She was given the cardiology followup information. She was given strict return cautions.  After history, exam, and medical workup I feel the patient has been appropriately medically screened  and is safe for discharge home. Pertinent diagnoses were discussed with the patient. Patient was given return precautions.   Shon Baton, MD 02/12/13 [phone]

## 2013-02-26 ENCOUNTER — Ambulatory Visit (INDEPENDENT_AMBULATORY_CARE_PROVIDER_SITE_OTHER): Payer: Medicare Other | Admitting: Internal Medicine

## 2013-02-26 ENCOUNTER — Encounter: Payer: Self-pay | Admitting: Internal Medicine

## 2013-02-26 VITALS — BP 106/82 | HR 61 | Ht 66.0 in | Wt 166.0 lb

## 2013-02-26 DIAGNOSIS — R002 Palpitations: Secondary | ICD-10-CM

## 2013-02-26 DIAGNOSIS — R0602 Shortness of breath: Secondary | ICD-10-CM

## 2013-02-26 DIAGNOSIS — R079 Chest pain, unspecified: Secondary | ICD-10-CM

## 2013-02-26 NOTE — Progress Notes (Signed)
HPI  Patient is a 67 yo who presents for evaluation of CP  The symptoms begain about 2 wks ago.  Pressure/tightness in chest.  OVer past few days has develped SOB, a fluttering sensation in chest, fatigue She was seen in [facility] ER 9/8  Had continuous CP  Pain was worse with cough  No sputum production Pain on Sat lasted all night. No worseninng with physcial activity She was d/c'd from ER  Continues to have similar symptoms.   Note she was seen in cardiology clinic about 7 years ago for similar chest pains.   Past Medical History      No Known Allergies  Current Outpatient Prescriptions  Medication Sig Dispense Refill  . Ascorbic Acid (VITAMIN C) 1000 MG tablet Take 1,000 mg by mouth daily.      . Cholecalciferol (VITAMIN D-3 PO) Take by mouth.      . cyanocobalamin 1000 MCG tablet Take 100 mcg by mouth daily.      . Nutritional Supplements (WOMENS FORMULA MENOPAUSE PO) Take by mouth.       No current facility-administered medications for this visit.    Past Medical History  Diagnosis Date  . Chest pain     Past Surgical History  Procedure Laterality Date  . Tubal ligation    . Appendectomy      Family History  Problem Relation Age of Onset  . Coronary artery disease    . Heart disease Father   . Heart failure Father     History   Social History  . Marital Status: Married    Spouse Name: N/A    Number of Children: N/A  . Years of Education: N/A   Occupational History  . Not on file.   Social History Main Topics  . Smoking status: Never Smoker   . Smokeless tobacco: Not on file  . Alcohol Use: No  . Drug Use: No  . Sexual Activity: Not on file   Other Topics Concern  . Not on file   Social History Narrative  . No narrative on file    Review of Systems:  All systems reviewed.  They are negative to the above problem except as previously stated.  Vital Signs: BP 106/82  Pulse 61  Ht 5\' 6"  (1.676 m)  Wt 166 lb (75.297 kg)  BMI 26.81 kg/m2   SpO2 99%  Physical Exam Patient is in NAD HEENT:  Normocephalic, atraumatic. EOMI, PERRLA.  Neck: JVP is normal.  No bruits.  Lungs: clear to auscultation. No rales no wheezes.  Heart: Regular rate and rhythm. Normal S1, S2. No S3.   No significant murmurs. PMI not displaced.  Abdomen:  Supple, nontender. Normal bowel sounds. No masses. No hepatomegaly.  Extremities:   Good distal pulses throughout. No lower extremity edema.  Musculoskeletal :moving all extremities.  Neuro:   alert and oriented x3.  CN II-XII grossly intact.   Assessment and Plan:  1.  CP  Pain is atypical  I do not think it is cardiac.  Would follow  May be more muscular.  2.  SOB  Will set up for echo to evaluate diastolic function  3.  Palpitations  Follow for now.

## 2013-02-26 NOTE — Patient Instructions (Addendum)
Your physician has requested that you have an echocardiogram. Echocardiography is a painless test that uses sound waves to create images of your heart. It provides your doctor with information about the size and shape of your heart and how well your heart's chambers and valves are working. This procedure takes approximately one hour. There are no restrictions for this procedure.   

## 2013-03-08 ENCOUNTER — Other Ambulatory Visit (HOSPITAL_COMMUNITY): Payer: Medicare Other

## 2013-03-12 ENCOUNTER — Ambulatory Visit (HOSPITAL_COMMUNITY): Payer: Medicare Other | Attending: Internal Medicine | Admitting: Radiology

## 2013-03-12 ENCOUNTER — Other Ambulatory Visit (HOSPITAL_COMMUNITY): Payer: Self-pay | Admitting: Internal Medicine

## 2013-03-12 DIAGNOSIS — R0789 Other chest pain: Secondary | ICD-10-CM

## 2013-03-12 DIAGNOSIS — R002 Palpitations: Secondary | ICD-10-CM

## 2013-03-12 DIAGNOSIS — R072 Precordial pain: Secondary | ICD-10-CM

## 2013-03-12 DIAGNOSIS — R0602 Shortness of breath: Secondary | ICD-10-CM

## 2013-03-12 DIAGNOSIS — I059 Rheumatic mitral valve disease, unspecified: Secondary | ICD-10-CM | POA: Insufficient documentation

## 2013-03-12 NOTE — Progress Notes (Signed)
Echocardiogram performed.  

## 2013-03-14 ENCOUNTER — Telehealth: Payer: Self-pay | Admitting: Cardiology

## 2013-03-14 DIAGNOSIS — R002 Palpitations: Secondary | ICD-10-CM

## 2013-03-14 NOTE — Telephone Encounter (Signed)
Follow up   Returned  [name]'s call

## 2013-03-14 NOTE — Telephone Encounter (Signed)
Spoke with pt, she is aware of normal echo results. She wants me to let dr Tenny Craw know while in the dermatology office this week they told her that her pulse was 169. Also she has felt her heart race today. Will make dr Tenny Craw aware

## 2013-03-15 NOTE — Telephone Encounter (Signed)
Spoke with pt, aware of dr Tenny Craw recommendations. Will place order and have [facility] call pt to schedule.

## 2013-03-15 NOTE — Telephone Encounter (Signed)
Please set up for event monitor 

## 2013-03-16 ENCOUNTER — Other Ambulatory Visit: Payer: Self-pay

## 2013-03-16 DIAGNOSIS — Z1231 Encounter for screening mammogram for malignant neoplasm of breast: Secondary | ICD-10-CM

## 2013-03-19 ENCOUNTER — Other Ambulatory Visit: Payer: Self-pay | Admitting: Obstetrics and Gynecology

## 2013-03-19 DIAGNOSIS — M8580 Other specified disorders of bone density and structure, unspecified site: Secondary | ICD-10-CM

## 2013-03-19 DIAGNOSIS — R29890 Loss of height: Secondary | ICD-10-CM

## 2013-03-29 ENCOUNTER — Encounter: Payer: Self-pay | Admitting: *Deleted

## 2013-03-29 DIAGNOSIS — R002 Palpitations: Secondary | ICD-10-CM

## 2013-03-29 NOTE — Progress Notes (Signed)
Patient ID: [name], female   DOB: 02/14/1946, 67 y.o.   MRN: [id number] [employer] verite 30 day cardiac event monitor applied to patient.

## 2013-03-30 ENCOUNTER — Telehealth: Payer: Self-pay | Admitting: Radiology

## 2013-03-30 NOTE — Telephone Encounter (Signed)
Pt had monitor placed yesterday she called this morning stating the monitor was a "nuisance" and will not work for her. I instructed her the importance of the monitor but to send back if she wasnt going to wear it.

## 2013-04-03 ENCOUNTER — Telehealth: Payer: Self-pay | Admitting: Internal Medicine

## 2013-04-03 NOTE — Telephone Encounter (Signed)
New message    Talk to [facility] a monitor and have questions

## 2013-04-12 ENCOUNTER — Ambulatory Visit
Admission: RE | Admit: 2013-04-12 | Discharge: 2013-04-12 | Disposition: A | Discharge status: Home / Self Care | Payer: 59 | Source: Ambulatory Visit | Attending: Nurse Practitioner | Admitting: Nurse Practitioner

## 2013-04-12 ENCOUNTER — Ambulatory Visit: Payer: Medicare Other

## 2013-04-12 ENCOUNTER — Other Ambulatory Visit: Payer: Self-pay | Admitting: Nurse Practitioner

## 2013-04-12 DIAGNOSIS — S0990XA Unspecified injury of head, initial encounter: Secondary | ICD-10-CM

## 2013-04-12 DIAGNOSIS — R519 Headache, unspecified: Secondary | ICD-10-CM

## 2013-04-16 ENCOUNTER — Telehealth: Payer: Self-pay | Admitting: Internal Medicine

## 2013-04-16 NOTE — Telephone Encounter (Signed)
New Problem:  Pt states she got a heart monitor on the 23rd and has not worn it yet. Pt states she has had a rough time. Pt states she is dealing with her son's passing and she recently fell in vacation in [place] and was dealing with a concussion. Pt would like to be advised on what she would do with the heart monitor since she has not started it yet.

## 2013-04-16 NOTE — Telephone Encounter (Signed)
Left message for pt to call.

## 2013-04-17 NOTE — Telephone Encounter (Signed)
Follow Up:  Pt is returning Debra's call.

## 2013-04-17 NOTE — Telephone Encounter (Signed)
Left pt a message to call back. 

## 2013-04-18 NOTE — Telephone Encounter (Signed)
Spoke with patient who states she has had extenuating circumstances and would like to postpone the wearing of the monitor.  I advised patient to return the monitor to the office.  Patient had a question about the billing for the monitor and I attempted to call Andee Lineman to ask her but was unable to get [place] on the phone.  I advised patient to ask for [place] or Orpha Bur when she comes here to return the monitor.  Patient verbalized understanding and thanked me for my help.

## 2013-04-19 ENCOUNTER — Encounter: Payer: Self-pay | Admitting: *Deleted

## 2013-04-19 NOTE — Progress Notes (Signed)
Patient ID: [name], female   DOB: 1946/01/23, 67 y.o.   MRN: [id number] Patient has made several calls regarding her [employer] 30 day cardiac event monitor.  The monitor was applied to the patient and a baseline recording sent 03/29/2013.  This was the only recording sent to [facility]. The patient has not been wearing her monitor due to the unexpected recent death of her 60 year old son, the subsequent trip to [place] for his memorial service, and an accidental fall in [place] sending her to the hospital with a concussion.  I spoke with our representative, Fatima Blank, at [employer].  [employer] will cancel monitoring charges for [name] if we fax a letter, signed by Dr. Tenny Craw, to their billing department.  The letter should include, patient name, date of birth, above listed circumstances, and a statement that [facility], will not charge any  fees for the same cardiac event monitor placed 03/29/2013.  This information was given to Deliah Goody, RN, who will present it to Dr. Tenny Craw 04/20/2013.

## 2013-04-19 NOTE — Telephone Encounter (Signed)
forwared this to [facility] in Monitor department. fyi refer to telephone msg on 03-30-13.

## 2013-04-20 ENCOUNTER — Encounter: Payer: Self-pay | Admitting: *Deleted

## 2013-04-20 NOTE — Progress Notes (Signed)
Letter generated and faxed to [employer] at [phone]. Pt make aware

## 2013-05-02 ENCOUNTER — Encounter (HOSPITAL_COMMUNITY): Payer: Self-pay | Admitting: Emergency Medicine

## 2013-05-02 ENCOUNTER — Emergency Department (HOSPITAL_COMMUNITY)
Admission: EM | Admit: 2013-05-02 | Discharge: 2013-05-02 | Disposition: A | Discharge status: Home / Self Care | Payer: Medicare Other | Attending: Emergency Medicine | Admitting: Emergency Medicine

## 2013-05-02 ENCOUNTER — Emergency Department (HOSPITAL_COMMUNITY): Payer: Medicare Other

## 2013-05-02 DIAGNOSIS — W07XXXA Fall from chair, initial encounter: Secondary | ICD-10-CM | POA: Insufficient documentation

## 2013-05-02 DIAGNOSIS — R42 Dizziness and giddiness: Secondary | ICD-10-CM

## 2013-05-02 DIAGNOSIS — S060X0A Concussion without loss of consciousness, initial encounter: Secondary | ICD-10-CM | POA: Insufficient documentation

## 2013-05-02 DIAGNOSIS — Y939 Activity, unspecified: Secondary | ICD-10-CM | POA: Insufficient documentation

## 2013-05-02 DIAGNOSIS — H538 Other visual disturbances: Secondary | ICD-10-CM | POA: Insufficient documentation

## 2013-05-02 DIAGNOSIS — Z8679 Personal history of other diseases of the circulatory system: Secondary | ICD-10-CM | POA: Insufficient documentation

## 2013-05-02 DIAGNOSIS — Y929 Unspecified place or not applicable: Secondary | ICD-10-CM | POA: Insufficient documentation

## 2013-05-02 LAB — URINALYSIS, ROUTINE W REFLEX MICROSCOPIC
Bilirubin Urine: NEGATIVE
Glucose, UA: NEGATIVE mg/dL
Hgb urine dipstick: NEGATIVE
Ketones, ur: NEGATIVE mg/dL
Leukocytes, UA: NEGATIVE
Nitrite: NEGATIVE
Protein, ur: NEGATIVE mg/dL
Specific Gravity, Urine: 1.01 (ref 1.005–1.030)
Urobilinogen, UA: 0.2 mg/dL (ref 0.0–1.0)
pH: 7.5 (ref 5.0–8.0)

## 2013-05-02 LAB — CBC
HCT: 35.8 % — ABNORMAL LOW (ref 36.0–46.0)
Hemoglobin: 11.3 g/dL — ABNORMAL LOW (ref 12.0–15.0)
MCH: 26.4 pg (ref 26.0–34.0)
MCHC: 31.6 g/dL (ref 30.0–36.0)
MCV: 83.6 fL (ref 78.0–100.0)
Platelets: 271 10*3/uL (ref 150–400)
RBC: 4.28 MIL/uL (ref 3.87–5.11)
RDW: 16.2 % — ABNORMAL HIGH (ref 11.5–15.5)
WBC: 2.4 10*3/uL — ABNORMAL LOW (ref 4.0–10.5)

## 2013-05-02 LAB — BASIC METABOLIC PANEL
BUN: 9 mg/dL (ref 6–23)
CO2: 32 mEq/L (ref 19–32)
Calcium: 10.3 mg/dL (ref 8.4–10.5)
Chloride: 106 mEq/L (ref 96–112)
Creatinine, Ser: 0.72 mg/dL (ref 0.50–1.10)
GFR calc Af Amer: >90 mL/min (ref >90)
GFR calc non Af Amer: 87 mL/min — ABNORMAL LOW (ref >90)
Glucose, Bld: 67 mg/dL — ABNORMAL LOW (ref 70–99)
Potassium: 4.1 mEq/L (ref 3.5–5.1)
Sodium: 143 mEq/L (ref 135–145)

## 2013-05-02 LAB — CG4 I-STAT (LACTIC ACID): Lactic Acid, Venous: 1.51 mmol/L (ref 0.5–2.2)

## 2013-05-02 MED ORDER — KETOROLAC TROMETHAMINE 30 MG/ML IJ SOLN
30.0000 mg | Freq: Once | INTRAMUSCULAR | Status: AC
  Administered 2013-05-02: 30 mg via INTRAVENOUS
  Filled 2013-05-02: qty 1

## 2013-05-02 MED ORDER — IBUPROFEN 600 MG PO TABS: 600.0000 mg | ORAL_TABLET | Freq: Three times a day (TID) | ORAL | Status: DC

## 2013-05-02 MED ORDER — SODIUM CHLORIDE 0.9 % IV BOLUS (SEPSIS)
1000.0000 mL | Freq: Once | INTRAVENOUS | Status: AC
  Administered 2013-05-02: 1000 mL via INTRAVENOUS

## 2013-05-02 MED ORDER — ONDANSETRON HCL 4 MG PO TABS: 4.0000 mg | ORAL_TABLET | Freq: Four times a day (QID) | ORAL | Status: DC

## 2013-05-02 MED ORDER — ONDANSETRON HCL 4 MG/2ML IJ SOLN
4.0000 mg | Freq: Once | INTRAMUSCULAR | Status: AC
  Administered 2013-05-02: 4 mg via INTRAVENOUS
  Filled 2013-05-02: qty 2

## 2013-05-02 NOTE — ED Notes (Signed)
NOTIFIED DR. WARD IN PERSON OF PATIENTS LAB RESULTS OF LACTIC ACID CG4 = 1.50mmoI/L.

## 2013-05-02 NOTE — ED Notes (Signed)
Spoke with MRI, Pt has one person ahead of her, will be about 45 minutes

## 2013-05-02 NOTE — ED Notes (Signed)
Pt was prescribed pain medication, took it for only 9 days. Is not currently taking pain medication at this time.

## 2013-05-02 NOTE — ED Notes (Signed)
MRI contacted me stating that it will be another 30 minutes before pt taken to MRI

## 2013-05-02 NOTE — ED Notes (Signed)
Patient transported to MR. 

## 2013-05-02 NOTE — ED Notes (Signed)
Patient states she is still unable to give a urine sample. 

## 2013-05-02 NOTE — ED Notes (Signed)
MD at bedside. 

## 2013-05-02 NOTE — ED Provider Notes (Signed)
TIME SEEN: 12:22 PM  CHIEF COMPLAINT: Headache, vertigo, left eye vision changes  HPI: Patient is a 67 year old female with no significant past medical history who presents to the emergency department with complaints of intermittent gradual onset, severe, left-sided posterior, sharp headaches that radiates into her left face with left-sided vision changes and vertigo for the past month. She denies any aggravating or alleviating factors. She reports that approximately one month ago she slipped out of a chair and struck her head on a bathtub. There was no loss of consciousness. She's been managed by her primary care physician, Dr. Renae Gloss, and has tried taking Topamax and over-the-counter anti-inflammatories without relief of symptoms. She feels as though the pain is incapacitating. She denies any numbness or focal weakness. No hearing loss or tinnitus. She is not on anticoagulation. She did have a CT of her head after the fall which was negative per her report. She was sent here to the emergency department for her primary care physician for further evaluation and a brain MRI given her persistent symptoms.  ROS: See HPI Constitutional: no fever  Eyes: no drainage  ENT: no runny nose   Cardiovascular:  no chest pain  Resp: no SOB  GI: no vomiting GU: no dysuria Integumentary: no rash  Allergy: no hives  Musculoskeletal: no leg swelling  Neurological: no slurred speech ROS otherwise negative  PAST MEDICAL HISTORY/PAST SURGICAL HISTORY:  Past Medical History  Diagnosis Date  . Chest pain     MEDICATIONS:  Prior to Admission medications   Medication Sig Start Date End Date Taking? Authorizing Provider  Ascorbic Acid (VITAMIN C) 1000 MG tablet Take 1,000 mg by mouth daily.    Historical Provider, MD  Cholecalciferol (VITAMIN D-3 PO) Take by mouth.    Historical Provider, MD  cyanocobalamin 1000 MCG tablet Take 100 mcg by mouth daily.    Historical Provider, MD  Nutritional Supplements  (WOMENS FORMULA MENOPAUSE PO) Take by mouth.    Historical Provider, MD    ALLERGIES:  No Known Allergies  SOCIAL HISTORY:  History  Substance Use Topics  . Smoking status: Never Smoker   . Smokeless tobacco: Not on file  . Alcohol Use: No    FAMILY HISTORY: Family History  Problem Relation Age of Onset  . Coronary artery disease    . Heart disease Father   . Heart failure Father     EXAM: BP 120/76  Pulse 82  Temp(Src) 97.4 F (36.3 C) (Oral)  Resp 20  Ht 5\' 6"  (1.676 m)  Wt 167 lb 4.8 oz (75.887 kg)  BMI 27.02 kg/m2  SpO2 100% CONSTITUTIONAL: Alert and oriented and responds appropriately to questions. Well-appearing; well-nourished HEAD: Normocephalic EYES: Conjunctivae clear, PERRL ENT: normal nose; no rhinorrhea; moist mucous membranes; pharynx without lesions noted NECK: Supple, no meningismus, no LAD; no midline spinal tenderness, step-off or deformity CARD: RRR; S1 and S2 appreciated; no murmurs, no clicks, no rubs, no gallops RESP: Normal chest excursion without splinting or tachypnea; breath sounds clear and equal bilaterally; no wheezes, no rhonchi, no rales,  ABD/GI: Normal bowel sounds; non-distended; soft, non-tender, no rebound, no guarding BACK:  The back appears normal and is non-tender to palpation, there is no CVA tenderness; no midline spinal tenderness, step-off or deformity EXT: Normal ROM in all joints; non-tender to palpation; no edema; normal capillary refill; no cyanosis    SKIN: Normal color for age and race; warm NEURO: Moves all extremities equally; strength 5/5 in all 4 extremities, patient does have  mild fatigable horizontal nystagmus on exam; cranial nerves II through XII intact, sensation to light touch intact diffusely, no dysmetria to finger to nose testing PSYCH: The patient's mood and manner are appropriate. Grooming and personal hygiene are appropriate.  MEDICAL DECISION MAKING: Patient here with likely postconcussive symptoms but  given her vertigo and left vision changes with posterior headache, will obtain MRI to rule out contusion, infarct.  Will also obtain basic labs and give IV fluids.  ED PROGRESS: Labs and urine unremarkable. Patient reports symptoms improved after Toradol, IV fluids and Zofran. MRI pending.   MRI is completely unremarkable. Patient reports feeling much better. I feel that her symptoms are consistent with a concussion. Have given head injury return precautions, concussion supportive care instructions. Will discharge with prescription for ibuprofen and Zofran. Patient has outpatient PCP followup. She verbalized understanding and is comfortable with plan.  Layla Maw Ward, DO 05/02/13 1743

## 2013-05-02 NOTE — ED Notes (Signed)
Pt states a month ago she was sitting on a chair and it slipped out from under her, pt states she fell and hit the back of her head on the tub. Was treated for a concussion and had a CT scan. States ever since then, pounding, throbbing, piercing pain in left side of head. States she feels very dizzy, feels like she is spinning and not the room. States when she bends over she feels like shes going to fall over. Pt also states her vision is getting more blurry.

## 2013-05-02 NOTE — ED Notes (Signed)
MD at Bedside.

## 2013-05-02 NOTE — ED Notes (Signed)
Pt returned from MRI °

## 2013-05-02 NOTE — ED Notes (Addendum)
Pt reports falling in October and hit back of head on the tub. Pt has been seen by pcp and was diagnosed with a concussion. Has been having left side headaches and pain to left side of face since the fall also having dizziness and feels lightheaded. Pt was told by pcp to come here to ed for possible MRI. No acute distress noted at triage.

## 2013-05-10 ENCOUNTER — Ambulatory Visit: Payer: Medicare Other

## 2013-05-10 ENCOUNTER — Other Ambulatory Visit: Payer: Medicare Other

## 2013-05-11 ENCOUNTER — Ambulatory Visit (INDEPENDENT_AMBULATORY_CARE_PROVIDER_SITE_OTHER): Payer: Medicare Other | Admitting: Neurology

## 2013-05-11 ENCOUNTER — Encounter: Payer: Self-pay | Admitting: Neurology

## 2013-05-11 VITALS — BP 115/72 | HR 89 | Ht 66.0 in | Wt 166.5 lb

## 2013-05-11 DIAGNOSIS — S060XAA Concussion with loss of consciousness status unknown, initial encounter: Secondary | ICD-10-CM | POA: Insufficient documentation

## 2013-05-11 DIAGNOSIS — S060X9A Concussion with loss of consciousness of unspecified duration, initial encounter: Secondary | ICD-10-CM | POA: Insufficient documentation

## 2013-05-11 DIAGNOSIS — R51 Headache: Secondary | ICD-10-CM

## 2013-05-11 DIAGNOSIS — S060X0A Concussion without loss of consciousness, initial encounter: Secondary | ICD-10-CM

## 2013-05-11 DIAGNOSIS — R519 Headache, unspecified: Secondary | ICD-10-CM | POA: Insufficient documentation

## 2013-05-11 HISTORY — DX: Concussion with loss of consciousness of unspecified duration, initial encounter: S06.0X9A

## 2013-05-11 HISTORY — DX: Concussion with loss of consciousness status unknown, initial encounter: S06.0XAA

## 2013-05-11 MED ORDER — SUMATRIPTAN SUCCINATE 25 MG PO TABS: 25.0000 mg | ORAL_TABLET | ORAL | Status: DC | PRN

## 2013-05-11 NOTE — Progress Notes (Signed)
[facility]  PATIENT: [name] DOB: [birth year]  HISTORICAL  [name] is a 67 years old right-handed African American female, accompanied by her husband, referred by her primary care physician Dr. Andi [name] for evaluation of left-sided headaches  She denies a previous history of migraine headaches, was very healthy, in October eighteenth 2014, at the hotel, close to midnight, she was trying to sit down at a rolling chair in front of the marrow, but she mis-positioned herself, fell backwards on the floor, landed on her occipital region at the bathtub, there was no loss of consciousness, but she developed occipital area headaches afterwards, rest of the night, she was afraid of going to the sleep,  She was okay for 3 weeks, until early November, she began to develop frequent, almost daily headaches, left parietal, left frontal, left retro-orbital region, 5 out of 7 days, the pain was moderate to severe, debilitating for her, lasting for a few hours, she has to take multiple doses of ibuprofen, she presented to the emergency room, CAT scan of the brain was normal, MRI of the brain was normal,  She also complains of mild blurry vision, no gait difficulty,  She is very hesitate about the potential side effect of the medication, she does not want to go on any preventive medications,   REVIEW OF SYSTEMS: Full 14 system review of systems performed and notable only for blurry vision, eye pain, headaches, dizziness, insomnia  ALLERGIES: No Known Allergies  HOME MEDICATIONS: Outpatient Prescriptions Prior to Visit  Medication Sig Dispense Refill  . Ascorbic Acid (VITAMIN C) 1000 MG tablet Take 1,000 mg by mouth daily.      . Cholecalciferol (VITAMIN D-3 PO) Take by mouth.      . cyanocobalamin 1000 MCG tablet Take 100 mcg by mouth daily.      Marland Kitchen ibuprofen (ADVIL,MOTRIN) 600 MG tablet Take 1 tablet (600 mg total) by mouth 3 (three) times daily.  21 tablet  0  .  Nutritional Supplements (WOMENS FORMULA MENOPAUSE PO) Take by mouth.      . ondansetron (ZOFRAN) 4 MG tablet Take 1 tablet (4 mg total) by mouth every 6 (six) hours.  12 tablet  0  . topiramate (TOPAMAX) 25 MG tablet Take 25 mg by mouth daily as needed (for headaches due to prior concussion).         PAST MEDICAL HISTORY: Past Medical History  Diagnosis Date  . Chest pain     PAST SURGICAL HISTORY: Past Surgical History  Procedure Laterality Date  . Tubal ligation    . Appendectomy    . Tonsillectomy    . Cervical cone biopsy      FAMILY HISTORY: Family History  Problem Relation Age of Onset  . Coronary artery disease    . Heart disease Father   . Heart failure Father     SOCIAL HISTORY:  History   Social History  . Marital Status: Married    Spouse Name: [name]    Number of Children: 6  . Years of Education: 36   Occupational History  . 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 Smoking status: Never Smoker   . Smokeless tobacco: Never Used  . Alcohol Use: No  . Drug Use: No  . Sexual Activity: Not on file   Other Topics Concern  . Not on file   Social History Narrative   Patient is married Conservation officer, historic buildings) and lives at home  with her husband and her daughter.   Patient has five living children and one is deceased.   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   Patient is right handed.   Patient drinks very little caffeine.     PHYSICAL EXAM   Filed Vitals:   05/11/13 1536  BP: 115/72  Pulse: 89  Height: 5\' 6"  (1.676 m)  Weight: 166 lb 8 oz (75.524 kg)     Body mass index is 26.89 kg/(m^2).   Generalized: In no acute distress  Neck: Supple, no carotid bruits   Cardiac: Regular rate rhythm  Pulmonary: Clear to auscultation bilaterally  Musculoskeletal: No deformity  Neurological examination  Mentation: Alert oriented to time, place, history taking, and causual conversation  Cranial  nerve II-XII: Pupils were equal round reactive to light extraocular movements were full, Visual field were full on confrontational test. Bilateral fundi were sharp.  Facial sensation and strength were normal. Hearing was intact to finger rubbing bilaterally. Uvula tongue midline.  head turning and shoulder shrug and were normal and symmetric.Tongue protrusion into cheek strength was normal.  Motor: normal tone, bulk and strength.  Sensory: Intact to fine touch, pinprick, preserved vibratory sensation, and proprioception at toes.  Coordination: Normal finger to nose, heel-to-shin bilaterally there was no truncal ataxia  Gait: Rising up from seated position without assistance, normal stance, without trunk ataxia, moderate stride, good arm swing, smooth turning, able to perform tiptoe, and heel walking without difficulty.   Romberg signs: Negative  Deep tendon reflexes: Brachioradialis 2/2, biceps 2/2, triceps 2/2, patellar 2/2, Achilles 2/2, plantar responses were flexor bilaterally.   DIAGNOSTIC DATA (LABS, IMAGING, TESTING) - I reviewed patient records, labs, notes, testing and imaging myself where available.  Lab Results  Component Value Date   WBC 2.4* 05/02/2013   HGB 11.3* 05/02/2013   HCT 35.8* 05/02/2013   MCV 83.6 05/02/2013   PLT 271 05/02/2013      Component Value Date/Time   NA 143 05/02/2013 1328   K 4.1 05/02/2013 1328   CL 106 05/02/2013 1328   CO2 32 05/02/2013 1328   GLUCOSE 67* 05/02/2013 1328   BUN 9 05/02/2013 1328   CREATININE 0.72 05/02/2013 1328   CALCIUM 10.3 05/02/2013 1328   GFRNONAA 87* 05/02/2013 1328   GFRAA >90 05/02/2013 1328   Lab Results  Component Value Date   VITAMINB12 1511* 03/10/2009    ASSESSMENT AND PLAN   67 years old right-handed [name], with concussion, now frequent and persistent moderate-to-severe left retrorbital headache, with migraine features, normal neurological examination, normal CAT scan of the brain, MRI  of the brain.  1. have discussed with her and her husband extensively about potential preventive medication, she decided not to take any preventive medications  2, will try Imitrex low dose 25 mg as needed if she has moderate or severe headaches  3, return to clinic in 3 months with Gerlene Fee, M.D. Ph.D.  [facility] [address], [address] [place], [place] [address] [phone]

## 2013-05-16 ENCOUNTER — Telehealth: Payer: Self-pay | Admitting: Neurology

## 2013-05-16 NOTE — Telephone Encounter (Signed)
Patient calling because she was seen lastweek for a concussion and she is having the same symptoms but she feels off. Wants to have someone call her back.

## 2013-05-16 NOTE — Telephone Encounter (Signed)
Please advise 

## 2013-06-12 ENCOUNTER — Ambulatory Visit
Admission: RE | Admit: 2013-06-12 | Discharge: 2013-06-12 | Disposition: A | Discharge status: Home / Self Care | Payer: Medicare Other | Source: Ambulatory Visit | Attending: Obstetrics and Gynecology | Admitting: Obstetrics and Gynecology

## 2013-06-12 ENCOUNTER — Ambulatory Visit
Admission: RE | Admit: 2013-06-12 | Discharge: 2013-06-12 | Disposition: A | Discharge status: Home / Self Care | Payer: Medicare Other | Source: Ambulatory Visit

## 2013-06-12 DIAGNOSIS — Z1231 Encounter for screening mammogram for malignant neoplasm of breast: Secondary | ICD-10-CM

## 2013-06-12 DIAGNOSIS — R29890 Loss of height: Secondary | ICD-10-CM

## 2013-06-12 DIAGNOSIS — M8580 Other specified disorders of bone density and structure, unspecified site: Secondary | ICD-10-CM

## 2013-08-01 ENCOUNTER — Telehealth: Payer: Self-pay | Admitting: Neurology

## 2013-08-01 NOTE — Telephone Encounter (Signed)
Patient calling again to state she hasn't heard anything back yet, patient wanted to schedule something with Dr. Terrace ArabiaYan on 08/03/13 which I did, at 8:30.

## 2013-08-01 NOTE — Telephone Encounter (Signed)
Pt called in and stated that she has been having a dull ached in the front of her head for the past couple of days as well dizziness. She states that since around 5 am she has felt that at times that she is spinning and other times that the room is spinning.  She would like to know if it would be possible for her to get in to see Dr. Terrace ArabiaYan today.  Please call her at [phone].  Thank you

## 2013-08-03 ENCOUNTER — Ambulatory Visit: Payer: Medicare Other | Admitting: Neurology

## 2013-08-03 NOTE — Telephone Encounter (Signed)
Called patient and left her a message stating that the office was closed because of snow please call back and we would schedule her another appt.

## 2013-08-03 NOTE — Telephone Encounter (Signed)
Pt was on schedule 08/03/13 office was closed at pt's time of appt. Due to weather. Please call pt back to r/s per notes below.

## 2013-08-03 NOTE — Telephone Encounter (Signed)
Patient has an appt to come in on 08/27/13 with Eber Jonesarolyn, NP.

## 2013-08-06 NOTE — Telephone Encounter (Signed)
Pt called back is wanting to know why no one has returned her call. Pt states she is still having problems and wants a nurse to call her back concerning her symptoms. Please read previous notes concerning pt. Pt also states you can reach her home # or her cell #.

## 2013-08-06 NOTE — Telephone Encounter (Signed)
Called patient to set up an earlier appt on 08/07/13 with Dr. Terrace ArabiaYan. I advised that patient that if she has any other problems, questions or concerns to call the office. Patient verbalized understanding.

## 2013-08-07 ENCOUNTER — Encounter: Payer: Self-pay | Admitting: Neurology

## 2013-08-07 ENCOUNTER — Encounter (INDEPENDENT_AMBULATORY_CARE_PROVIDER_SITE_OTHER): Payer: Self-pay

## 2013-08-07 ENCOUNTER — Ambulatory Visit (INDEPENDENT_AMBULATORY_CARE_PROVIDER_SITE_OTHER): Payer: Medicare Other | Admitting: Neurology

## 2013-08-07 VITALS — BP 112/73 | HR 86 | Ht 65.0 in | Wt 171.0 lb

## 2013-08-07 DIAGNOSIS — R519 Headache, unspecified: Secondary | ICD-10-CM

## 2013-08-07 DIAGNOSIS — R42 Dizziness and giddiness: Secondary | ICD-10-CM

## 2013-08-07 DIAGNOSIS — R51 Headache: Secondary | ICD-10-CM

## 2013-08-07 HISTORY — DX: Dizziness and giddiness: R42

## 2013-08-07 NOTE — Patient Instructions (Signed)
Vertigo Vertigo means you feel like you or your surroundings are moving when they are not. Vertigo can be dangerous if it occurs when you are at work, driving, or performing difficult activities.  CAUSES  Vertigo occurs when there is a conflict of signals sent to your brain from the visual and sensory systems in your body. There are many different causes of vertigo, including:  Infections, especially in the inner ear.  A bad reaction to a drug or misuse of alcohol and medicines.  Withdrawal from drugs or alcohol.  Rapidly changing positions, such as lying down or rolling over in bed.  A migraine headache.  Decreased blood flow to the brain.  Increased pressure in the brain from a head injury, infection, tumor, or bleeding. SYMPTOMS  You may feel as though the world is spinning around or you are falling to the ground. Because your balance is upset, vertigo can cause nausea and vomiting. You may have involuntary eye movements (nystagmus).   Benign Positional Vertigo Vertigo means you feel like you or your surroundings are moving when they are not. Benign positional vertigo is the most common form of vertigo. Benign means that the cause of your condition is not serious. Benign positional vertigo is more common in older adults. CAUSES  Benign positional vertigo is the result of an upset in the labyrinth system. This is an area in the middle ear that helps control your balance. This may be caused by a viral infection, head injury, or repetitive motion. However, often no specific cause is found. SYMPTOMS  Symptoms of benign positional vertigo occur when you move your head or eyes in different directions. Some of the symptoms may include:  Loss of balance and falls.  Vomiting.  Blurred vision.  Dizziness.  Nausea.  Involuntary eye movements (nystagmus). DIAGNOSIS  Benign positional vertigo is usually diagnosed by physical exam. If the specific cause of your benign positional  vertigo is unknown, your caregiver may perform imaging tests, such as magnetic resonance imaging (MRI) or computed tomography (CT). TREATMENT  Your caregiver may recommend movements or procedures to correct the benign positional vertigo. Medicines such as meclizine, benzodiazepines, and medicines for nausea may be used to treat your symptoms. In rare cases, if your symptoms are caused by certain conditions that affect the inner ear, you may need surgery. HOME CARE INSTRUCTIONS   Follow your caregiver's instructions.  Move slowly. Do not make sudden body or head movements.  Avoid driving.  Avoid operating heavy machinery.  Avoid performing any tasks that would be dangerous to you or others during a vertigo episode.  Drink enough fluids to keep your urine clear or pale yellow. SEEK IMMEDIATE MEDICAL CARE IF:   You develop problems with walking, weakness, numbness, or using your arms, hands, or legs.  You have difficulty speaking.  You develop severe headaches.  Your nausea or vomiting continues or gets worse.  You develop visual changes.  Your family or friends notice any behavioral changes.  Your condition gets worse.  You have a fever.  You develop a stiff neck or sensitivity to light. MAKE SURE YOU:   Understand these instructions.  Will watch your condition.  Will get help right away if you are not doing well or get worse. Document Released: 03/01/2006 Document Revised: 08/16/2011 Document Reviewed: 02/11/2011 [facility] Patient Information 2014 [place] [place].  Vertigo is usually diagnosed by physical exam. If the cause of your vertigo is unknown, your caregiver may perform imaging tests, such as an MRI scan (  magnetic resonance imaging). TREATMENT  Most cases of vertigo resolve on their own, without treatment. Depending on the cause, your caregiver may prescribe certain medicines. If your vertigo is related to body position issues, your caregiver may recommend  movements or procedures to correct the problem. In rare cases, if your vertigo is caused by certain inner ear problems, you may need surgery. HOME CARE INSTRUCTIONS   Follow your caregiver's instructions.  Avoid driving.  Avoid operating heavy machinery.  Avoid performing any tasks that would be dangerous to you or others during a vertigo episode.  Tell your caregiver if you notice that certain medicines seem to be causing your vertigo. Some of the medicines used to treat vertigo episodes can actually make them worse in some people. SEEK IMMEDIATE MEDICAL CARE IF:   Your medicines do not relieve your vertigo or are making it worse.  You develop problems with talking, walking, weakness, or using your arms, hands, or legs.  You develop severe headaches.  Your nausea or vomiting continues or gets worse.  You develop visual changes.  A family member notices behavioral changes.  Your condition gets worse. MAKE SURE YOU:  Understand these instructions.  Will watch your condition.  Will get help right away if you are not doing well or get worse. Document Released: 03/03/2005 Document Revised: 08/16/2011 Document Reviewed: 12/10/2010 [facility] Patient Information 2014 [place], [place].

## 2013-08-07 NOTE — Progress Notes (Signed)
[facility]  PATIENT: Joan GrillsDiane [name] DOB: 1946-01-08  HISTORICAL  [name] is a 68 years old right-handed African American female, accompanied by her husband, referred by her primary care physician Dr. Andi DevonKimberly Shelton for evaluation of left-sided headaches  She denies a previous history of migraine headaches, was very healthy, in October eighteenth 2014, at the hotel, close to midnight, she was trying to sit down at a rolling chair in front of the marrow, but she mis-positioned herself, fell backwards on the floor, landed on her occipital region at the bathtub, there was no loss of consciousness, but she developed occipital area headaches afterwards, rest of the night, she was afraid of going to the sleep,  She was okay for 3 weeks, until early November, she began to develop frequent, almost daily headaches, left parietal, left frontal, left retro-orbital region, 5 out of 7 days, the pain was moderate to severe, debilitating for her, lasting for a few hours, she has to take multiple doses of ibuprofen, she presented to the emergency room, CAT scan of the brain was normal, MRI of the brain was normal,  She also complains of mild blurry vision, no gait difficulty,  She is very hesitate about the potential side effect of the medication, she does not want to go on any preventive medications  UPDATE March 3rd 2015: She no longer has headaches, has been doing very well until 3 weeks ago, she woke up in the middle of the night using bathroom, felt sudden onset vertigo, gait difficulty, she was able to go back to bed, when she woke up next morning, she continued to felt vertigo, dizziness, unsteady gait, ever since the event, with sudden movement, she felt unsteadiness, no hearing loss, no tinnitus, no double vision,   REVIEW OF SYSTEMS: Full 14 system review of systems performed and notable only for blurry vision, eye pain, headaches, dizziness, insomnia  ALLERGIES: No Known  Allergies  HOME MEDICATIONS: Outpatient Prescriptions Prior to Visit  Medication Sig Dispense Refill  . Ascorbic Acid (VITAMIN C) 1000 MG tablet Take 1,000 mg by mouth daily.      . Cholecalciferol (VITAMIN D-3 PO) Take by mouth.      . cyanocobalamin 1000 MCG tablet Take 100 mcg by mouth daily.      Marland Kitchen. ibuprofen (ADVIL,MOTRIN) 600 MG tablet Take 1 tablet (600 mg total) by mouth 3 (three) times daily.  21 tablet  0  . Nutritional Supplements (WOMENS FORMULA MENOPAUSE PO) Take by mouth.      . ondansetron (ZOFRAN) 4 MG tablet Take 1 tablet (4 mg total) by mouth every 6 (six) hours.  12 tablet  0  . topiramate (TOPAMAX) 25 MG tablet Take 25 mg by mouth daily as needed (for headaches due to prior concussion).         PAST MEDICAL HISTORY: Past Medical History  Diagnosis Date  . Chest pain   . Head pain   . Dizziness     PAST SURGICAL HISTORY: Past Surgical History  Procedure Laterality Date  . Tubal ligation    . Appendectomy    . Tonsillectomy    . Cervical cone biopsy      FAMILY HISTORY: Family History  Problem Relation Age of Onset  . Coronary artery disease    . Heart disease Father   . Heart failure Father     SOCIAL HISTORY:  History   Social History  . Marital Status: Married    Spouse Name: Dennie Biblethelstan    Number of  Children: 6  . Years of Education: 15   Occupational History  . 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 Smoking status: Never Smoker   . Smokeless tobacco: Never Used  . Alcohol Use: No  . Drug Use: No  . Sexual Activity: Not on file   Other Topics Concern  . Not on file   Social History Narrative   Patient is married Conservation officer, historic buildings) and lives at home with her husband and her daughter.   Patient has five living children and one is deceased.   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   Patient is right handed.   Patient drinks very little caffeine.     PHYSICAL EXAM   Filed Vitals:    08/07/13 0824  BP: 112/73  Pulse: 86  Height: 5\' 5"  (1.651 m)  Weight: 171 lb (77.565 kg)     Body mass index is 28.46 kg/(m^2).   Generalized: In no acute distress  Neck: Supple, no carotid bruits   Cardiac: Regular rate rhythm  Pulmonary: Clear to auscultation bilaterally  Musculoskeletal: No deformity  Neurological examination  Mentation: Alert oriented to time, place, history taking, and causual conversation  Cranial nerve II-XII: Pupils were equal round reactive to light extraocular movements were full, Visual field were full on confrontational test. Bilateral fundi were sharp.  Facial sensation and strength were normal. Hearing was intact to finger rubbing bilaterally. Uvula tongue midline.  head turning and shoulder shrug and were normal and symmetric.Tongue protrusion into cheek strength was normal.  Motor: normal tone, bulk and strength.  Sensory: Intact to fine touch, pinprick, preserved vibratory sensation, and proprioception at toes.  Coordination: Normal finger to nose, heel-to-shin bilaterally there was no truncal ataxia  Gait: Rising up from seated position without assistance, normal stance, without trunk ataxia, moderate stride, good arm swing, smooth turning, able to perform tiptoe, and heel walking without difficulty.   Romberg signs: Negative  Deep tendon reflexes: Brachioradialis 2/2, biceps 2/2, triceps 2/2, patellar 2/2, Achilles 2/2, plantar responses were flexor bilaterally.  I have performed repositioning maneuver, there was no nystagmus, no vertigo induced with right or left year dependent position.   DIAGNOSTIC DATA (LABS, IMAGING, TESTING) - I reviewed patient records, labs, notes, testing and imaging myself where available.  Lab Results  Component Value Date   WBC 2.4* 05/02/2013   HGB 11.3* 05/02/2013   HCT 35.8* 05/02/2013   MCV 83.6 05/02/2013   PLT 271 05/02/2013      Component Value Date/Time   NA 143 05/02/2013 1328   K 4.1  05/02/2013 1328   CL 106 05/02/2013 1328   CO2 32 05/02/2013 1328   GLUCOSE 67* 05/02/2013 1328   BUN 9 05/02/2013 1328   CREATININE 0.72 05/02/2013 1328   CALCIUM 10.3 05/02/2013 1328   GFRNONAA 87* 05/02/2013 1328   GFRAA >90 05/02/2013 1328   Lab Results  Component Value Date   VITAMINB12 1511* 03/10/2009    ASSESSMENT AND PLAN   68 years old right-handed [name], with concussion, now frequent and persistent moderate-to-severe left retrorbital headache, with migraine features, normal neurological examination, normal CAT scan of the brain, MRI of the brain. Now presenting with one episode of acute onset of vertigo, Most suggestive of benign positional vertigo, however with her age, vascular risk factors, need to rule out posterior circulation insufficiency, we will proceed with MRA of brain and neck, she is to continue repositioning  maneuver, baby aspirin, return to clinic in 2-3 months  Levert Feinstein, M.D. Ph.D.  [facility] [address] [place], [place] [address] [phone]

## 2013-08-17 ENCOUNTER — Ambulatory Visit
Admission: RE | Admit: 2013-08-17 | Discharge: 2013-08-17 | Disposition: A | Discharge status: Home / Self Care | Payer: Medicare Other | Source: Ambulatory Visit | Attending: Neurology | Admitting: Neurology

## 2013-08-17 ENCOUNTER — Ambulatory Visit
Admission: RE | Admit: 2013-08-17 | Discharge: 2013-08-17 | Disposition: A | Discharge status: Home / Self Care | Payer: 59 | Source: Ambulatory Visit | Attending: Neurology | Admitting: Neurology

## 2013-08-17 DIAGNOSIS — R51 Headache: Secondary | ICD-10-CM

## 2013-08-17 DIAGNOSIS — R42 Dizziness and giddiness: Secondary | ICD-10-CM

## 2013-08-17 DIAGNOSIS — R519 Headache, unspecified: Secondary | ICD-10-CM

## 2013-08-17 MED ORDER — GADOBENATE DIMEGLUMINE 529 MG/ML IV SOLN
16.0000 mL | Freq: Once | INTRAVENOUS | Status: AC | PRN
  Administered 2013-08-17: 16 mL via INTRAVENOUS

## 2013-08-19 ENCOUNTER — Encounter (HOSPITAL_COMMUNITY): Payer: Self-pay | Admitting: Emergency Medicine

## 2013-08-19 ENCOUNTER — Emergency Department (HOSPITAL_COMMUNITY): Payer: Medicare Other

## 2013-08-19 ENCOUNTER — Emergency Department (HOSPITAL_COMMUNITY)
Admission: EM | Admit: 2013-08-19 | Discharge: 2013-08-19 | Disposition: A | Discharge status: Home / Self Care | Payer: Medicare Other | Attending: Emergency Medicine | Admitting: Emergency Medicine

## 2013-08-19 DIAGNOSIS — Z79899 Other long term (current) drug therapy: Secondary | ICD-10-CM | POA: Insufficient documentation

## 2013-08-19 DIAGNOSIS — R071 Chest pain on breathing: Secondary | ICD-10-CM | POA: Insufficient documentation

## 2013-08-19 DIAGNOSIS — R079 Chest pain, unspecified: Secondary | ICD-10-CM

## 2013-08-19 LAB — CBC
HCT: 36.6 % (ref 36.0–46.0)
Hemoglobin: 11.4 g/dL — ABNORMAL LOW (ref 12.0–15.0)
MCH: 26.4 pg (ref 26.0–34.0)
MCHC: 31.1 g/dL (ref 30.0–36.0)
MCV: 84.7 fL (ref 78.0–100.0)
Platelets: 260 10*3/uL (ref 150–400)
RBC: 4.32 MIL/uL (ref 3.87–5.11)
RDW: 16.1 % — ABNORMAL HIGH (ref 11.5–15.5)
WBC: 3.4 10*3/uL — ABNORMAL LOW (ref 4.0–10.5)

## 2013-08-19 LAB — BASIC METABOLIC PANEL
BUN: 10 mg/dL (ref 6–23)
CO2: 28 mEq/L (ref 19–32)
Calcium: 10.2 mg/dL (ref 8.4–10.5)
Chloride: 103 mEq/L (ref 96–112)
Creatinine, Ser: 0.76 mg/dL (ref 0.50–1.10)
GFR calc Af Amer: >90 mL/min (ref >90)
GFR calc non Af Amer: 85 mL/min — ABNORMAL LOW (ref >90)
Glucose, Bld: 69 mg/dL — ABNORMAL LOW (ref 70–99)
Potassium: 3.7 mEq/L (ref 3.7–5.3)
Sodium: 142 mEq/L (ref 137–147)

## 2013-08-19 LAB — I-STAT TROPONIN, ED: Troponin i, poc: 0 ng/mL (ref 0.00–0.08)

## 2013-08-19 LAB — PRO B NATRIURETIC PEPTIDE: Pro B Natriuretic peptide (BNP): 144 pg/mL — ABNORMAL HIGH (ref 0–125)

## 2013-08-19 NOTE — Discharge Instructions (Signed)
Ibuprofen 600 mg 3 times daily for the next 5 days.  Return to the emergency department if you develop severe pain, difficulty breathing, or other new and concerning symptoms.   Chest Pain (Nonspecific) It is often hard to give a specific diagnosis for the cause of chest pain. There is always a chance that your pain could be related to something serious, such as a heart attack or a blood clot in the lungs. You need to follow up with your caregiver for further evaluation. CAUSES   Heartburn.  Pneumonia or bronchitis.  Anxiety or stress.  Inflammation around your heart (pericarditis) or lung (pleuritis or pleurisy).  A blood clot in the lung.  A collapsed lung (pneumothorax). It can develop suddenly on its own (spontaneous pneumothorax) or from injury (trauma) to the chest.  Shingles infection (herpes zoster virus). The chest wall is composed of bones, muscles, and cartilage. Any of these can be the source of the pain.  The bones can be bruised by injury.  The muscles or cartilage can be strained by coughing or overwork.  The cartilage can be affected by inflammation and become sore (costochondritis). DIAGNOSIS  Lab tests or other studies, such as X-rays, electrocardiography, stress testing, or cardiac imaging, may be needed to find the cause of your pain.  TREATMENT   Treatment depends on what may be causing your chest pain. Treatment may include:  Acid blockers for heartburn.  Anti-inflammatory medicine.  Pain medicine for inflammatory conditions.  Antibiotics if an infection is present.  You may be advised to change lifestyle habits. This includes stopping smoking and avoiding alcohol, caffeine, and chocolate.  You may be advised to keep your head raised (elevated) when sleeping. This reduces the chance of acid going backward from your stomach into your esophagus.  Most of the time, nonspecific chest pain will improve within 2 to 3 days with rest and mild pain  medicine. HOME CARE INSTRUCTIONS   If antibiotics were prescribed, take your antibiotics as directed. Finish them even if you start to feel better.  For the next few days, avoid physical activities that bring on chest pain. Continue physical activities as directed.  Do not smoke.  Avoid drinking alcohol.  Only take over-the-counter or prescription medicine for pain, discomfort, or fever as directed by your caregiver.  Follow your caregiver's suggestions for further testing if your chest pain does not go away.  Keep any follow-up appointments you made. If you do not go to an appointment, you could develop lasting (chronic) problems with pain. If there is any problem keeping an appointment, you must call to reschedule. SEEK MEDICAL CARE IF:   You think you are having problems from the medicine you are taking. Read your medicine instructions carefully.  Your chest pain does not go away, even after treatment.  You develop a rash with blisters on your chest. SEEK IMMEDIATE MEDICAL CARE IF:   You have increased chest pain or pain that spreads to your arm, neck, jaw, back, or abdomen.  You develop shortness of breath, an increasing cough, or you are coughing up blood.  You have severe back or abdominal pain, feel nauseous, or vomit.  You develop severe weakness, fainting, or chills.  You have a fever. THIS IS AN EMERGENCY. Do not wait to see if the pain will go away. Get medical help at once. Call your local emergency services (911 in [place] Do not drive yourself to the hospital. MAKE SURE YOU:   Understand these instructions.  Will  watch your condition.  Will get help right away if you are not doing well or get worse. Document Released: 03/03/2005 Document Revised: 08/16/2011 Document Reviewed: 12/28/2007 [facility] Patient Information 2014 [place].

## 2013-08-19 NOTE — ED Provider Notes (Signed)
CSN: [id number]     Arrival date & time 08/19/13  1222 History   First MD Initiated Contact with Patient 08/19/13 1305     Chief Complaint  Patient presents with  . Chest Pain     (Consider location/radiation/quality/duration/timing/severity/associated sxs/prior Treatment) HPI Comments: Patient is a 68 year old female with no significant past medical history and no cardiac risk factors. She presents today with complaints of waking this morning with sharp pains in the Center of her chest. These have improved as the day has gone on. She denies any shortness of breath, cough, diaphoresis, nausea, or radiation to the arm or jaw. She denies any recent exertional symptoms. She has had a stress test in the past which was unremarkable. She reports being upset recently over the death of a friend in [place]. She was due to leave this morning to attend her funeral. Patient states she feels anxious and believes that this is the likely cause of her symptoms.  Patient is a 68 y.o. female presenting with chest pain. The history is provided by the patient.  Chest Pain Pain location:  Substernal area Pain quality: sharp   Pain radiates to:  Does not radiate Pain radiates to the back: no   Pain severity:  Mild Onset quality:  Sudden Duration:  6 hours Timing:  Constant Progression:  Resolved Chronicity:  New Relieved by:  Nothing Worsened by:  Nothing tried   Past Medical History  Diagnosis Date  . Chest pain   . Head pain   . Dizziness    Past Surgical History  Procedure Laterality Date  . Tubal ligation    . Appendectomy    . Tonsillectomy    . Cervical cone biopsy     Family History  Problem Relation Age of Onset  . Coronary artery disease    . Heart disease Father   . Heart failure Father    History  Substance Use Topics  . Smoking status: Never Smoker   . Smokeless tobacco: Never Used  . Alcohol Use: No   OB History   Grav Para Term Preterm Abortions TAB SAB Ect Mult Living                  Review of Systems  Cardiovascular: Positive for chest pain.  All other systems reviewed and are negative.      Allergies  Review of patient's allergies indicates no known allergies.  Home Medications   Current Outpatient Rx  Name  Route  Sig  Dispense  Refill  . Ascorbic Acid (VITAMIN C) 1000 MG tablet   Oral   Take 1,000 mg by mouth daily.         Marland Kitchen. BIOTIN PO   Oral   Take 1 tablet by mouth daily.         . Cholecalciferol (VITAMIN D-3 PO)   Oral   Take 1 tablet by mouth daily.          . cyanocobalamin 1000 MCG tablet   Oral   Take 100 mcg by mouth daily.         Marland Kitchen. LECITHIN PO   Oral   Take 1 tablet by mouth daily.         . Nutritional Supplements (WOMENS FORMULA MENOPAUSE PO)   Oral   Take 1 tablet by mouth daily.           BP 108/66  Pulse 69  Temp(Src) 97.9 F (36.6 C) (Oral)  Resp 20  Ht 5\' 6"  (  1.676 m)  Wt 170 lb (77.111 kg)  BMI 27.45 kg/m2  SpO2 98% Physical Exam  Nursing note and vitals reviewed. Constitutional: She is oriented to person, place, and time. She appears well-developed and well-nourished. No distress.  HENT:  Head: Normocephalic and atraumatic.  Neck: Normal range of motion. Neck supple.  Cardiovascular: Normal rate and regular rhythm.  Exam reveals no gallop and no friction rub.   No murmur heard. Pulmonary/Chest: Effort normal and breath sounds normal. No respiratory distress. She has no wheezes. She exhibits tenderness.  There is mild tenderness to the anterior chest wall which reproduces the patient's symptoms.  Abdominal: Soft. Bowel sounds are normal. She exhibits no distension. There is no tenderness.  Musculoskeletal: Normal range of motion.  Neurological: She is alert and oriented to person, place, and time.  Skin: Skin is warm and dry. She is not diaphoretic.    ED Course  Procedures (including critical care time) Labs Review Labs Reviewed  CBC - Abnormal; Notable for the following:     WBC 3.4 (*)    Hemoglobin 11.4 (*)    RDW 16.1 (*)    All other components within normal limits  BASIC METABOLIC PANEL - Abnormal; Notable for the following:    Glucose, Bld 69 (*)    GFR calc non Af Amer 85 (*)    All other components within normal limits  PRO B NATRIURETIC PEPTIDE - Abnormal; Notable for the following:    Pro B Natriuretic peptide (BNP) 144.0 (*)    All other components within normal limits  I-STAT TROPOININ, ED   Imaging Review Dg Chest 2 View  08/19/2013   CLINICAL DATA:  Chest pain and shortness of breath  EXAM: CHEST  2 VIEW  COMPARISON:  DG CHEST 2 VIEW dated 02/12/2013  FINDINGS: The heart size and mediastinal contours are within normal limits. There is minimal curvilinear left greater than right lower lobe atelectasis. The lungs are otherwise clear. The visualized skeletal structures are unremarkable.  IMPRESSION: Minimal bibasilar atelectasis. Early pneumonia could appear similar.   Electronically Signed   By: Christiana Pellant M.D.   On: 08/19/2013 13:55     EKG Interpretation   Date/Time:  Sunday August 19 2013 12:29:11 EDT Ventricular Rate:  82 PR Interval:  146 QRS Duration: 80 QT Interval:  364 QTC Calculation: 425 R Axis:   -11 Text Interpretation:  Normal sinus rhythm Low voltage QRS Borderline ECG  Confirmed by DELOS  MD, Junaid Wurzer ([id number]) on 08/19/2013 1:55:44 PM      MDM   Final diagnoses:  None    Patient is a 68 year old female with no prior cardiac history and no cardiac risk factors who presents with atypical chest discomfort. Cardiac workup is negative including EKG and troponin following 6 hours of discomfort. She is feeling better at present and I believe she is appropriate for discharge. I strongly doubt a cardiac etiology, however the patient understands to return if her symptoms significantly worsen or change.    Geoffery Lyons, MD 08/19/13 1415

## 2013-08-19 NOTE — ED Notes (Signed)
Pt reports onset this am of sharp mid chest pains, now has decreased to just a discomfort. Denies any n/v or sob. ekg done at triage and airway intact.

## 2013-08-21 ENCOUNTER — Telehealth: Payer: Self-pay | Admitting: Neurology

## 2013-08-21 NOTE — Telephone Encounter (Signed)
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

## 2013-08-21 NOTE — Telephone Encounter (Signed)
Patient is calling requesting MRI results. Please advise. °

## 2013-08-21 NOTE — Telephone Encounter (Signed)
Patient calling to request MRI results, please call patient and advise.  °

## 2013-08-22 NOTE — Telephone Encounter (Signed)
Spoke to patient and relayed MRA of neck and brain results, per Dr. Terrace ArabiaYan.  Patient would like to speak to doctor because she still has a dull ache in forehead and dizziness.

## 2013-08-22 NOTE — Progress Notes (Signed)
Quick Note:  Spoke to patient and relayed MRA bain and neck results, per Dr. Terrace ArabiaYan. ______

## 2013-08-23 ENCOUNTER — Other Ambulatory Visit: Payer: Medicare Other

## 2013-08-24 NOTE — Telephone Encounter (Signed)
I have called [name], fail to reach her by cellphone and home phone, left message.

## 2013-08-27 ENCOUNTER — Ambulatory Visit: Payer: Medicare Other | Admitting: Nurse Practitioner

## 2013-12-05 ENCOUNTER — Ambulatory Visit: Payer: Medicare Other | Admitting: Nurse Practitioner

## 2013-12-11 ENCOUNTER — Ambulatory Visit (INDEPENDENT_AMBULATORY_CARE_PROVIDER_SITE_OTHER): Payer: Medicare Other | Admitting: Family Medicine

## 2013-12-11 ENCOUNTER — Ambulatory Visit (INDEPENDENT_AMBULATORY_CARE_PROVIDER_SITE_OTHER): Payer: Medicare Other

## 2013-12-11 VITALS — BP 116/72 | HR 67 | Temp 97.8°F | Resp 18 | Ht 67.0 in | Wt 164.0 lb

## 2013-12-11 DIAGNOSIS — M79609 Pain in unspecified limb: Secondary | ICD-10-CM

## 2013-12-11 DIAGNOSIS — S92919A Unspecified fracture of unspecified toe(s), initial encounter for closed fracture: Secondary | ICD-10-CM

## 2013-12-11 DIAGNOSIS — M79674 Pain in right toe(s): Secondary | ICD-10-CM

## 2013-12-11 DIAGNOSIS — S92501A Displaced unspecified fracture of right lesser toe(s), initial encounter for closed fracture: Secondary | ICD-10-CM

## 2013-12-11 DIAGNOSIS — M7989 Other specified soft tissue disorders: Secondary | ICD-10-CM

## 2013-12-11 NOTE — Progress Notes (Signed)
Subjective:    Patient ID: [name], female    DOB: 03-Apr-1946, 68 y.o.   MRN: [id number]  HPI [name] is a 68 y.o. female   Reaching up to shelf yesterday, wooden puzzle from about 5-6 feet fell onto top of R foot. Sore over 3rd toe, some bleeding from small cut next to nail, and bruising into toe.  Limping some d/t pain, but less sore today. No attempted treatments.  Will be traveling to [place] next week - not the one driving.   Tx: none.    Patient Active Problem List   Diagnosis Date Noted  . Vertigo 08/07/2013  . Head pain   . Concussion 05/11/2013  . Headache(784.0) 05/11/2013  . OTHER CHEST PAIN 01/30/2010   Past Medical History  Diagnosis Date  . Chest pain   . Head pain   . Dizziness    Past Surgical History  Procedure Laterality Date  . Tubal ligation    . Appendectomy    . Tonsillectomy    . Cervical cone biopsy     No Known Allergies Prior to Admission medications   Medication Sig Start Date End Date Taking? Authorizing Provider  Ascorbic Acid (VITAMIN C) 1000 MG tablet Take 1,000 mg by mouth daily.   Yes Historical Provider, MD  BIOTIN PO Take 1 tablet by mouth daily.   Yes Historical Provider, MD  Cholecalciferol (VITAMIN D-3 PO) Take 1 tablet by mouth daily.    Yes Historical Provider, MD  cyanocobalamin 1000 MCG tablet Take 100 mcg by mouth daily.   Yes Historical Provider, MD  LECITHIN PO Take 1 tablet by mouth daily.   Yes Historical Provider, MD  Nutritional Supplements (WOMENS FORMULA MENOPAUSE PO) Take 1 tablet by mouth daily.    Yes Historical Provider, MD   History   Social History  . Marital Status: Married    Spouse Name: Athelstan    Number of Children: 6  . Years of Education: 1119   Occupational History  . Not on file.   Social History Main Topics  . Smoking status: Never Smoker   . Smokeless tobacco: Never Used  . Alcohol Use: No  . Drug Use: No  . Sexual Activity: Not on file   Other Topics Concern  . Not on file     Social History Narrative   Patient is married Conservation officer, historic buildings(Athelstan) and lives at home with her husband and her daughter.   Patient has five living children and one is deceased.   Patient is a retired Runner, broadcasting/film/videoteacher.   Patient has a Scientist, water qualityMasters.   Patient is right handed.   Patient drinks very little caffeine.       Review of Systems  Musculoskeletal: Positive for arthralgias and joint swelling (3rd r toe. ).  Skin: Positive for color change and wound. Negative for rash.       Objective:   Physical Exam  Constitutional: She is oriented to person, place, and time. She appears well-developed and well-nourished. No distress.  Cardiovascular:  Cap refill less than 1 second, toes warm  Pulmonary/Chest: Effort normal.  Musculoskeletal:       Right foot: She exhibits decreased range of motion (at 3rd toe, but able to flex/ext at MTP and IP's. ), tenderness, bony tenderness, swelling, deformity and laceration (small hemostatic lateral to distal toenail. no widening with tension. ). She exhibits normal capillary refill and no crepitus.       Feet:  Neurological: She is alert and oriented to person, place,  and time.  nvi distally to tip of toe.   Skin: Skin is warm and dry. Ecchymosis noted. No rash noted.  Psychiatric: She has a normal mood and affect. Her behavior is normal.    Filed Vitals:   12/11/13 1526  BP: 116/72  Pulse: 67  Temp: 97.8 F (36.6 C)  TempSrc: Oral  Resp: 18  Height: 5\' 7"  (1.702 m)  Weight: 164 lb (74.39 kg)  SpO2: 99%    [facility] reading (PRIMARY) by  Dr. Neva Seat: R great toe. Nondisplaced transverse fracture of 3rd distal phalynx.      Assessment & Plan:  [name] is a 68 y.o. female Pain and swelling of toe, right - Plan: DG Toe 3rd Right  Fracture of third toe, right, closed, initial encounter  Closed fx, appears well aligned. Small hemostatic abrasion/superficial laceration.  Wound care and rtc precautions discussed. Post op x4wks, and buddy tape x 6 weeks.     No orders of the defined types were placed in this encounter.   Patient Instructions  Change bandage twice per day after soap and water cleansing for next 1 week.  Then buddy tape for 6 weeks total.  Wear post-op shoe for 4 weeks, then hard soled shoe for next 2 weeks. Tylenol or if needed - ibuprofen over the counter for pain.   Return to the clinic or go to the nearest emergency room if any of your symptoms worsen or new symptoms occur.   Toe Fracture Your caregiver has diagnosed you as having a fractured toe. A toe fracture is a break in the bone of a toe. "Buddy taping" is a way of splinting your broken toe, by taping the broken toe to the toe next to it. This "buddy taping" will keep the injured toe from moving beyond normal range of motion. Buddy taping also helps the toe heal in a more normal alignment. It may take 6 to 8 weeks for the toe injury to heal. HOME CARE INSTRUCTIONS   Leave your toes taped together for as long as directed by your caregiver or until you see a doctor for a follow-up examination. You can change the tape after bathing. Always use a small piece of gauze or cotton between the toes when taping them together. This will help the skin stay dry and prevent infection.  Apply ice to the injury for 15-20 minutes each hour while awake for the first 2 days. Put the ice in a plastic bag and place a towel between the bag of ice and your skin.  After the first 2 days, apply heat to the injured area. Use heat for the next 2 to 3 days. Place a heating pad on the foot or soak the foot in warm water as directed by your caregiver.  Keep your foot elevated as much as possible to lessen swelling.  Wear sturdy, supportive shoes. The shoes should not pinch the toes or fit tightly against the toes.  Your caregiver may prescribe a rigid shoe if your foot is very swollen.  Your may be given crutches if the pain is too great and it hurts too much to walk.  Only take over-the-counter  or prescription medicines for pain, discomfort, or fever as directed by your caregiver.  If your caregiver has given you a follow-up appointment, it is very important to keep that appointment. Not keeping the appointment could result in a chronic or permanent injury, pain, and disability. If there is any problem keeping the appointment, you must  call back to this facility for assistance. SEEK MEDICAL CARE IF:   You have increased pain or swelling, not relieved with medications.  The pain does not get better after 1 week.  Your injured toe is cold when the others are warm. SEEK IMMEDIATE MEDICAL CARE IF:   The toe becomes cold, numb, or white.  The toe becomes hot (inflamed) and red. Document Released: 05/21/2000 Document Revised: 08/16/2011 Document Reviewed: 01/08/2008 [facility] Patient Information 2015 [place], [place]. This information is not intended to replace advice given to you by your health care provider. Make sure you discuss any questions you have with your health care provider.

## 2013-12-11 NOTE — Patient Instructions (Addendum)
Change bandage twice per day after soap and water cleansing for next 1 week.  Then buddy tape for 6 weeks total.  Wear post-op shoe for 4 weeks, then hard soled shoe for next 2 weeks. Tylenol or if needed - ibuprofen over the counter for pain.   Return to the clinic or go to the nearest emergency room if any of your symptoms worsen or new symptoms occur.   Toe Fracture Your caregiver has diagnosed you as having a fractured toe. A toe fracture is a break in the bone of a toe. "Buddy taping" is a way of splinting your broken toe, by taping the broken toe to the toe next to it. This "buddy taping" will keep the injured toe from moving beyond normal range of motion. Buddy taping also helps the toe heal in a more normal alignment. It may take 6 to 8 weeks for the toe injury to heal. HOME CARE INSTRUCTIONS   Leave your toes taped together for as long as directed by your caregiver or until you see a doctor for a follow-up examination. You can change the tape after bathing. Always use a small piece of gauze or cotton between the toes when taping them together. This will help the skin stay dry and prevent infection.  Apply ice to the injury for 15-20 minutes each hour while awake for the first 2 days. Put the ice in a plastic bag and place a towel between the bag of ice and your skin.  After the first 2 days, apply heat to the injured area. Use heat for the next 2 to 3 days. Place a heating pad on the foot or soak the foot in warm water as directed by your caregiver.  Keep your foot elevated as much as possible to lessen swelling.  Wear sturdy, supportive shoes. The shoes should not pinch the toes or fit tightly against the toes.  Your caregiver may prescribe a rigid shoe if your foot is very swollen.  Your may be given crutches if the pain is too great and it hurts too much to walk.  Only take over-the-counter or prescription medicines for pain, discomfort, or fever as directed by your  caregiver.  If your caregiver has given you a follow-up appointment, it is very important to keep that appointment. Not keeping the appointment could result in a chronic or permanent injury, pain, and disability. If there is any problem keeping the appointment, you must call back to this facility for assistance. SEEK MEDICAL CARE IF:   You have increased pain or swelling, not relieved with medications.  The pain does not get better after 1 week.  Your injured toe is cold when the others are warm. SEEK IMMEDIATE MEDICAL CARE IF:   The toe becomes cold, numb, or white.  The toe becomes hot (inflamed) and red. Document Released: 05/21/2000 Document Revised: 08/16/2011 Document Reviewed: 01/08/2008 [facility] Patient Information 2015 [place], [place]. This information is not intended to replace advice given to you by your health care provider. Make sure you discuss any questions you have with your health care provider.

## 2013-12-13 ENCOUNTER — Other Ambulatory Visit: Payer: Self-pay

## 2013-12-13 DIAGNOSIS — S92911A Unspecified fracture of right toe(s), initial encounter for closed fracture: Secondary | ICD-10-CM

## 2013-12-13 DIAGNOSIS — M79604 Pain in right leg: Secondary | ICD-10-CM

## 2013-12-13 NOTE — Progress Notes (Signed)
   Subjective:    Patient ID: [name], female    DOB: 1945-11-09, 68 y.o.   MRN: [id number]  HPI    Review of Systems     Objective:   Physical Exam        Assessment & Plan:    Pt came in today, Thursday, July 9 th with complaints of post op shoe being to big and loose causing more discomfort. Different options were tried with no relief or better fitting. I reviewed and discussed this patient with Dr. Conley RollsLe , who then wrote a Rx for the pt and her daughter to take to a medical supply store in hopes of finding a post op shoe with better fitting.  Copy of Rx script was placed in scan box for scanning as well.

## 2013-12-13 NOTE — Progress Notes (Unsigned)
   Subjective:    Patient ID: [name], female    DOB: 06-Sep-1945, 68 y.o.   MRN: [id number]  HPI    Review of Systems     Objective:   Physical Exam        Assessment & Plan:

## 2014-01-01 ENCOUNTER — Telehealth: Payer: Self-pay | Admitting: *Deleted

## 2014-01-01 NOTE — Telephone Encounter (Signed)
Called patient on both numbers left message to return the call to r/s appointment

## 2014-01-04 NOTE — Telephone Encounter (Signed)
Spoke with patient to r/s appointment from 01/14/14, patient was r/s to 03/28/14 at 10:30 am.

## 2014-01-14 ENCOUNTER — Ambulatory Visit: Payer: Medicare Other | Admitting: Nurse Practitioner

## 2014-01-23 ENCOUNTER — Ambulatory Visit (INDEPENDENT_AMBULATORY_CARE_PROVIDER_SITE_OTHER): Payer: Medicare Other | Admitting: Podiatry

## 2014-01-23 ENCOUNTER — Ambulatory Visit (INDEPENDENT_AMBULATORY_CARE_PROVIDER_SITE_OTHER): Payer: Medicare Other

## 2014-01-23 ENCOUNTER — Encounter: Payer: Self-pay | Admitting: Podiatry

## 2014-01-23 VITALS — BP 124/73 | HR 55 | Resp 16 | Ht 66.0 in | Wt 168.0 lb

## 2014-01-23 DIAGNOSIS — S92911A Unspecified fracture of right toe(s), initial encounter for closed fracture: Secondary | ICD-10-CM

## 2014-01-23 DIAGNOSIS — S92919A Unspecified fracture of unspecified toe(s), initial encounter for closed fracture: Secondary | ICD-10-CM

## 2014-01-23 DIAGNOSIS — M722 Plantar fascial fibromatosis: Secondary | ICD-10-CM

## 2014-01-24 ENCOUNTER — Ambulatory Visit: Payer: Medicare Other | Admitting: Podiatry

## 2014-01-24 ENCOUNTER — Telehealth: Payer: Self-pay | Admitting: *Deleted

## 2014-01-24 NOTE — Progress Notes (Signed)
Subjective:     Patient ID: [name], female   DOB: 1945-10-12, 68 y.o.   MRN: [id number]  Foot Pain   patient presents stating that I traumatized my third toe right I have not been able to wear shoe and I developed pain in the bottom of my arch that makes it hard for me to walk and I'm starting to limp   Review of Systems  All other systems reviewed and are negative.      Objective:   Physical Exam  Nursing note and vitals reviewed. Constitutional: She is oriented to person, place, and time.  Cardiovascular: Intact distal pulses.   Musculoskeletal: Normal range of motion.  Neurological: She is oriented to person, place, and time.  Skin: Skin is warm.   neurovascular status found to be intact with muscle strength adequate and range of motion subtalar midtarsal joint within normal limits. Patient's found to have discomfort in the arch of a moderate nature and edema of the third toe right foot with some distal discoloration but not significant pain when palpated digits are well-perfused and arch height is normal and she is well oriented x3     Assessment:     Inflammatory changes third toe right with probable previous fracture and plantar fasciitis of a moderate nature secondary to-type shoes she is wearing    Plan:     H&P and x-ray reviewed. Today I went ahead and I dispensed a night splint to stretch the plantar fascia and advised on gradual return to soft shoes and reappoint for us to recheck again as needed

## 2014-01-24 NOTE — Telephone Encounter (Signed)
I was in the office yesterday to see Dr. Charlsie Merlesegal.  I broke my toe.  I have tingling and throbbing in my foot.  He gave me a boot to wear.  I have not opened it.  I don't think I want to use it.  So, my question is can I return the boot?  My second question is when can I resume walking and exercising?

## 2014-01-25 NOTE — Telephone Encounter (Signed)
I called and informed her that Dr. Charlsie Merlesegal said he gave her the boot to be used after you exercised to recover from the activity.  She stated I felt intimidated by the boot because I had been wearing the black shoe for 6 weeks already.  My toe is feeling much better now.  I brought the boot back today.  The good news is that my toe feels better and they had not processed the boot yet with my insurance.  But thank you for calling.

## 2014-01-25 NOTE — Telephone Encounter (Signed)
I guess but the idea of the boot is to allow her to exercise and then wear the boot after activity

## 2014-01-30 ENCOUNTER — Ambulatory Visit: Payer: Medicare Other | Admitting: Podiatry

## 2014-03-28 ENCOUNTER — Ambulatory Visit: Payer: Self-pay | Admitting: Nurse Practitioner

## 2014-04-09 ENCOUNTER — Emergency Department (HOSPITAL_COMMUNITY): Payer: Medicare Other

## 2014-04-09 ENCOUNTER — Observation Stay (HOSPITAL_COMMUNITY): Payer: Medicare Other

## 2014-04-09 ENCOUNTER — Observation Stay (HOSPITAL_COMMUNITY)
Admission: EM | Admit: 2014-04-09 | Discharge: 2014-04-10 | Disposition: A | Discharge status: Home / Self Care | Payer: Medicare Other | Attending: Internal Medicine | Admitting: Internal Medicine

## 2014-04-09 ENCOUNTER — Encounter (HOSPITAL_COMMUNITY): Payer: Self-pay | Admitting: Adult Health

## 2014-04-09 DIAGNOSIS — R079 Chest pain, unspecified: Principal | ICD-10-CM | POA: Diagnosis present

## 2014-04-09 DIAGNOSIS — Z79899 Other long term (current) drug therapy: Secondary | ICD-10-CM | POA: Insufficient documentation

## 2014-04-09 DIAGNOSIS — R072 Precordial pain: Secondary | ICD-10-CM

## 2014-04-09 DIAGNOSIS — Z8249 Family history of ischemic heart disease and other diseases of the circulatory system: Secondary | ICD-10-CM

## 2014-04-09 DIAGNOSIS — I309 Acute pericarditis, unspecified: Secondary | ICD-10-CM

## 2014-04-09 LAB — CBC
HCT: 36.7 % (ref 36.0–46.0)
Hemoglobin: 11.3 g/dL — ABNORMAL LOW (ref 12.0–15.0)
MCH: 25.5 pg — ABNORMAL LOW (ref 26.0–34.0)
MCHC: 30.8 g/dL (ref 30.0–36.0)
MCV: 82.7 fL (ref 78.0–100.0)
Platelets: 227 10*3/uL (ref 150–400)
RBC: 4.44 MIL/uL (ref 3.87–5.11)
RDW: 17.2 % — ABNORMAL HIGH (ref 11.5–15.5)
WBC: 5.2 10*3/uL (ref 4.0–10.5)

## 2014-04-09 LAB — BASIC METABOLIC PANEL
Anion gap: 14 (ref 5–15)
BUN: 13 mg/dL (ref 6–23)
CO2: 25 mEq/L (ref 19–32)
Calcium: 10.3 mg/dL (ref 8.4–10.5)
Chloride: 100 mEq/L (ref 96–112)
Creatinine, Ser: 0.75 mg/dL (ref 0.50–1.10)
GFR calc Af Amer: >90 mL/min (ref >90)
GFR calc non Af Amer: 85 mL/min — ABNORMAL LOW (ref >90)
Glucose, Bld: 108 mg/dL — ABNORMAL HIGH (ref 70–99)
Potassium: 4 mEq/L (ref 3.7–5.3)
Sodium: 139 mEq/L (ref 137–147)

## 2014-04-09 LAB — TROPONIN I
Troponin I: <0.3 ng/mL (ref <0.30)
Troponin I: <0.3 ng/mL (ref <0.30)
Troponin I: <0.3 ng/mL (ref <0.30)

## 2014-04-09 LAB — HEPATIC FUNCTION PANEL
ALT: 11 U/L (ref 0–35)
AST: 21 U/L (ref 0–37)
Albumin: 3.4 g/dL — ABNORMAL LOW (ref 3.5–5.2)
Alkaline Phosphatase: 69 U/L (ref 39–117)
Bilirubin, Direct: <0.2 mg/dL (ref 0.0–0.3)
Total Bilirubin: 0.4 mg/dL (ref 0.3–1.2)
Total Protein: 6.9 g/dL (ref 6.0–8.3)

## 2014-04-09 LAB — LIPID PANEL
Cholesterol: 124 mg/dL (ref 0–200)
HDL: 62 mg/dL (ref >39)
LDL Cholesterol: 56 mg/dL (ref 0–99)
Total CHOL/HDL Ratio: 2 RATIO
Triglycerides: 32 mg/dL (ref <150)
VLDL: 6 mg/dL (ref 0–40)

## 2014-04-09 LAB — I-STAT TROPONIN, ED: Troponin i, poc: 0.01 ng/mL (ref 0.00–0.08)

## 2014-04-09 LAB — TSH: TSH: 0.352 u[IU]/mL (ref 0.350–4.500)

## 2014-04-09 LAB — HEMOGLOBIN A1C
Hgb A1c MFr Bld: 5.5 % (ref <5.7)
Mean Plasma Glucose: 111 mg/dL (ref <117)

## 2014-04-09 LAB — D-DIMER, QUANTITATIVE (NOT AT ARMC): D-Dimer, Quant: 0.42 ug/mL-FEU (ref 0.00–0.48)

## 2014-04-09 MED ORDER — MORPHINE SULFATE 2 MG/ML IJ SOLN
2.0000 mg | INTRAMUSCULAR | Status: DC | PRN
Start: 2014-04-09 — End: 2014-04-10

## 2014-04-09 MED ORDER — ONDANSETRON HCL 4 MG/2ML IJ SOLN: 4.0000 mg | Freq: Four times a day (QID) | INTRAMUSCULAR | Status: DC | PRN

## 2014-04-09 MED ORDER — ASPIRIN EC 81 MG PO TBEC
81.0000 mg | DELAYED_RELEASE_TABLET | Freq: Every day | ORAL | Status: DC
Start: 2014-04-09 — End: 2014-04-10
  Administered 2014-04-09: 81 mg via ORAL
  Filled 2014-04-09: qty 1

## 2014-04-09 MED ORDER — ACETAMINOPHEN 325 MG PO TABS: 650.0000 mg | ORAL_TABLET | ORAL | Status: DC | PRN

## 2014-04-09 MED ORDER — GADOBENATE DIMEGLUMINE 529 MG/ML IV SOLN
28.0000 mL | Freq: Once | INTRAVENOUS | Status: AC | PRN
  Administered 2014-04-09: 28 mL via INTRAVENOUS

## 2014-04-09 MED ORDER — SODIUM CHLORIDE 0.9 % IV BOLUS (SEPSIS)
1000.0000 mL | Freq: Once | INTRAVENOUS | Status: AC
  Administered 2014-04-09: 1000 mL via INTRAVENOUS

## 2014-04-09 MED ORDER — NITROGLYCERIN 2 % TD OINT
1.0000 [in_us] | TOPICAL_OINTMENT | Freq: Once | TRANSDERMAL | Status: AC
  Administered 2014-04-09: 1 [in_us] via TOPICAL
  Filled 2014-04-09: qty 1

## 2014-04-09 MED ORDER — REGADENOSON 0.4 MG/5ML IV SOLN
INTRAVENOUS | Status: AC
Start: 2014-04-09 — End: 2014-04-10
  Filled 2014-04-09: qty 5

## 2014-04-09 MED ORDER — ASPIRIN 325 MG PO TABS
325.0000 mg | ORAL_TABLET | ORAL | Status: AC
  Administered 2014-04-09: 325 mg via ORAL
  Filled 2014-04-09: qty 1

## 2014-04-09 MED ORDER — ENOXAPARIN SODIUM 80 MG/0.8ML ~~LOC~~ SOLN
1.0000 mg/kg | Freq: Two times a day (BID) | SUBCUTANEOUS | Status: DC
  Filled 2014-04-09: qty 0.8

## 2014-04-09 MED ORDER — NITROGLYCERIN 0.4 MG SL SUBL
0.4000 mg | SUBLINGUAL_TABLET | SUBLINGUAL | Status: DC | PRN
  Administered 2014-04-09 (×3): 0.4 mg via SUBLINGUAL
  Filled 2014-04-09: qty 1

## 2014-04-09 NOTE — Plan of Care (Signed)
Problem: Consults Goal: General Medical Patient Education See Patient Education Module for specific education.  Outcome: Progressing     

## 2014-04-09 NOTE — ED Provider Notes (Signed)
CSN: [id number]     Arrival date & time 04/09/14  0151 History   First MD Initiated Contact with Patient 04/09/14 0335     Chief Complaint  Patient presents with  . Chest Pain     (Consider location/radiation/quality/duration/timing/severity/associated sxs/prior Treatment) HPI  68 year old female presents with chest pain started partially 8:30 last night. The patient noted that after she came in from church she started having a sternal chest pain that was sharp. Does get worse with inspiration. Also worse with lying flat and leaning forward. When walking around she became more short of breath and had more chest pain. No leg pain or leg swelling. History of blood clots. Pain is similar to about one half year ago. Denies a history of coronary disease. Prior to my arrival patient had been given aspirin and 3 nitroglycerin. Her pain had significantly improved but she has become somewhat dizzy.  Past Medical History  Diagnosis Date  . Chest pain   . Head pain   . Dizziness    Past Surgical History  Procedure Laterality Date  . Tubal ligation    . Appendectomy    . Tonsillectomy    . Cervical cone biopsy     Family History  Problem Relation Age of Onset  . Coronary artery disease    . Heart disease Father   . Heart failure Father    History  Substance Use Topics  . Smoking status: Never Smoker   . Smokeless tobacco: Never Used  . Alcohol Use: No   OB History    No data available     Review of Systems  Constitutional: Negative for fever.  Respiratory: Positive for shortness of breath.   Cardiovascular: Positive for chest pain. Negative for leg swelling.  Gastrointestinal: Negative for nausea and abdominal pain.  Musculoskeletal: Negative for back pain.  All other systems reviewed and are negative.     Allergies  Review of patient's allergies indicates no known allergies.  Home Medications   Prior to Admission medications   Medication Sig Start Date End Date Taking?  Authorizing Provider  Ascorbic Acid (VITAMIN C) 1000 MG tablet Take 1,000 mg by mouth daily.   Yes Historical Provider, MD  BIOTIN PO Take 1 tablet by mouth daily.   Yes Historical Provider, MD  Cholecalciferol (VITAMIN D-3 PO) Take 1 tablet by mouth daily.    Yes Historical Provider, MD  cyanocobalamin 1000 MCG tablet Take 100 mcg by mouth daily.   Yes Historical Provider, MD  LECITHIN PO Take 1 tablet by mouth daily.   Yes Historical Provider, MD  Multiple Vitamins-Minerals (WOMENS BONE HEALTH PO) Take 3 tablets by mouth daily.   Yes Historical Provider, MD  Nutritional Supplements (WOMENS FORMULA MENOPAUSE PO) Take 1 tablet by mouth daily.     Historical Provider, MD   BP 104/60 mmHg  Pulse 61  Temp(Src) 98.3 F (36.8 C) (Oral)  Resp 17  SpO2 100% Physical Exam  Constitutional: She is oriented to person, place, and time. She appears well-developed and well-nourished. No distress.  HENT:  Head: Normocephalic and atraumatic.  Right Ear: External ear normal.  Left Ear: External ear normal.  Nose: Nose normal.  Eyes: Right eye exhibits no discharge. Left eye exhibits no discharge.  Cardiovascular: Normal rate, regular rhythm and normal heart sounds.   Pulmonary/Chest: Effort normal and breath sounds normal. She exhibits no tenderness.  Abdominal: Soft. There is no tenderness.  Musculoskeletal: She exhibits no edema.  Neurological: She is alert and oriented  to person, place, and time.  Skin: Skin is warm and dry. She is not diaphoretic.  Nursing note and vitals reviewed.   ED Course  Procedures (including critical care time) Labs Review Labs Reviewed  CBC - Abnormal; Notable for the following:    Hemoglobin 11.3 (*)    MCH 25.5 (*)    RDW 17.2 (*)    All other components within normal limits  BASIC METABOLIC PANEL - Abnormal; Notable for the following:    Glucose, Bld 108 (*)    GFR calc non Af Amer 85 (*)    All other components within normal limits  D-DIMER, QUANTITATIVE   I-STAT TROPOININ, ED    Imaging Review Dg Chest 2 View  04/09/2014   CLINICAL DATA:  Bleeding Mid chest pain.  EXAM: CHEST  2 VIEW  COMPARISON:  08/19/2013  FINDINGS: Mild hyperinflation. The heart size and mediastinal contours are within normal limits. Slight fibrosis in the lung bases. No focal airspace disease or consolidation. Postoperative changes in the right shoulder. The visualized skeletal structures are unremarkable.  IMPRESSION: No active cardiopulmonary disease.   Electronically Signed   By: Burman NievesWilliam  Stevens M.D.   On: 04/09/2014 02:22     EKG Interpretation   Date/Time:  Tuesday April 09 2014 01:58:24 EST Ventricular Rate:  88 PR Interval:  148 QRS Duration: 86 QT Interval:  358 QTC Calculation: 433 R Axis:   -33 Text Interpretation:  Normal sinus rhythm Possible Left atrial enlargement  Left axis deviation Left ventricular hypertrophy Abnormal ECG No  significant change since last tracing Confirmed by Aalayah Riles  MD, Novalyn Lajara  ([id number]) on 04/09/2014 3:36:57 AM      MDM   Final diagnoses:  Chest pain    Patient's HEART score is 4. Pain controlled with nitroglycerin, will add on nitropaste. Dizziness resolved with fluids. Low risk for PE, but given pleuritic symptoms ddimer sent, is negative. No further workup for this. Exertional symptoms are concerning. ACS vs pericarditis. Will admit for overnight observation to hospitalist for ACS r/o    Audree CamelScott T Riyanshi Wahab, MD 04/09/14 [phone]

## 2014-04-09 NOTE — Consult Note (Signed)
CARDIOLOGY CONSULT NOTE   Patient ID: Joan GrillsDiane Christiano MRN: [id number], DOB/AGE: [birth year]   Admit date: 04/09/2014 Date of Consult: 04/09/2014  Primary Physician: Alva GarnetSHELTON,KIMBERLY R., MD Primary Cardiologist: Macario Carlsnone/Jamir Rone H   Reason for consult:  Chest pain  Problem List  Past Medical History  Diagnosis Date  . Chest pain   . Head pain   . Dizziness     Past Surgical History  Procedure Laterality Date  . Tubal ligation    . Appendectomy    . Tonsillectomy    . Cervical cone biopsy       Allergies  No Known Allergies  HPI   [name] is a 68 y.o. female withNO SIGNIFICANT Past medical history. The patient presented with complaints of chest pain. She mentions that the pain started at rest while she was sitting at the seminar the last evening. It got worse with position changes, laying down made it worse sitting up made it better. She is currently pain free. The pain was not related to exertion, there was no radiation or other associated symptoms.  She denies any recent URI or other viral illness. She was around small children the last weekend. She states that she had similar episodes in the past. No h/o smoking, no family history of premature CAD, but CHF in her father and 68 year old daughter.   Inpatient Medications  . aspirin EC  81 mg Oral Daily  . enoxaparin (LOVENOX) injection  1 mg/kg Subcutaneous Q12H    Family History Family History  Problem Relation Age of Onset  . Coronary artery disease    . Heart disease Father   . Heart failure Father      Social History History   Social History  . Marital Status: Married    Spouse Name: Athelstan    Number of Children: 6  . Years of Education: [id number]   Occupational History  . Not on file.   Social History Main Topics  . Smoking status: Never Smoker   . Smokeless tobacco: Never Used  . Alcohol Use: No  . Drug Use: No  . Sexual Activity: Not on file   Other Topics Concern  . Not on  file   Social History Narrative   Patient is married Conservation officer, historic buildings(Athelstan) and lives at home with her husband and her daughter.   Patient has five living children and one is deceased.   Patient is a retired Runner, broadcasting/film/videoteacher.   Patient has a Scientist, water qualityMasters.   Patient is right handed.   Patient drinks very little caffeine.     Review of Systems  General:  No chills, fever, night sweats or weight changes.  Cardiovascular:  No chest pain, dyspnea on exertion, edema, orthopnea, palpitations, paroxysmal nocturnal dyspnea. Dermatological: No rash, lesions/masses Respiratory: No cough, dyspnea Urologic: No hematuria, dysuria Abdominal:   No nausea, vomiting, diarrhea, bright red blood per rectum, melena, or hematemesis Neurologic:  No visual changes, wkns, changes in mental status. All other systems reviewed and are otherwise negative except as noted above.  Physical Exam  Blood pressure 102/54, pulse 70, temperature 98.1 F (36.7 C), temperature source Oral, resp. rate 21, weight 172 lb 8 oz (78.245 kg), SpO2 98 %.  General: Pleasant, NAD Psych: Normal affect. Neuro: Alert and oriented X 3. Moves all extremities spontaneously. HEENT: Normal  Neck: Supple without bruits or JVD. Lungs:  Resp regular and unlabored, CTA. Heart: RRR no s3, s4, or murmurs. Systolic and diastolic rub Abdomen: Soft, non-tender, non-distended, BS + x  4.  Extremities: No clubbing, cyanosis or edema. DP/PT/Radials 2+ and equal bilaterally.  Labs  No results for input(s): CKTOTAL, CKMB, TROPONINI in the last 72 hours. Lab Results  Component Value Date   WBC 5.2 04/09/2014   HGB 11.3* 04/09/2014   HCT 36.7 04/09/2014   MCV 82.7 04/09/2014   PLT 227 04/09/2014    Recent Labs Lab 04/09/14 0216  NA 139  K 4.0  CL 100  CO2 25  BUN 13  CREATININE 0.75  CALCIUM 10.3  GLUCOSE 108*   No results found for: CHOL, HDL, LDLCALC, TRIG Lab Results  Component Value Date   DDIMER 0.42 04/09/2014   Invalid input(s):  POCBNP  Radiology/Studies  Dg Chest 2 View  04/09/2014   CLINICAL DATA:  Bleeding Mid chest pain.  EXAM: CHEST  2 VIEW  COMPARISON:  08/19/2013  FINDINGS: Mild hyperinflation. The heart size and mediastinal contours are within normal limits. Slight fibrosis in the lung bases. No focal airspace disease or consolidation. Postoperative changes in the right shoulder. The visualized skeletal structures are unremarkable.  IMPRESSION: No active cardiopulmonary disease.   Electronically Signed   By: Burman NievesWilliam  Stevens M.D.   On: 04/09/2014 02:22    Echocardiogram - 03/12/2013 Study Conclusions  - Left ventricle: The cavity size was normal. Wall thickness was normal. Systolic function was normal. The estimated ejection fraction was in the range of 55% to 60%. Wall motion was normal; there were no regional wall motion abnormalities. Doppler parameters are consistent with abnormal left ventricular relaxation (grade 1 diastolic dysfunction). - Mitral valve: Mild regurgitation.   ECG: SR, LAD    ASSESSMENT AND PLAN  68 year old, younger appearing female  1. Chest pain - negative troponin x 2, negative D dimer, ECG not suggestive of pericarditis, however clinical presentation and rub on physical exam consistent with the diagnosis of an acute pericarditis. I will order a stress MRI (with use of Lexiscan) to evaluate for pericarditis and to rule out ischemia  2. FH of CHF - echo the last year normal LV size and systolic function  3. BP - controlled  4. Lipids - not known, we will check   Signed, Lars MassonNELSON, Demba Nigh H, MD, [facility] 04/09/2014, 7:53 AM

## 2014-04-09 NOTE — Discharge Summary (Addendum)
Physician Discharge Summary  [name] MRN: [id number] DOB/AGE: [birth year] 68 y.o.  PCP: [name]., MD   Admit date: 04/09/2014 Discharge date: 04/09/2014  Discharge Diagnoses:    Principal Problem:   Chest pain Hypertension Dyslipidemia     Medication List    TAKE these medications        BIOTIN PO  Take 1 tablet by mouth daily.     cyanocobalamin 1000 MCG tablet  Take 100 mcg by mouth daily.     LECITHIN PO  Take 1 tablet by mouth daily.     vitamin C 1000 MG tablet  Take 1,000 mg by mouth daily.     VITAMIN D-3 PO  Take 1 tablet by mouth daily.     WOMENS BONE HEALTH PO  Take 3 tablets by mouth daily.     WOMENS FORMULA MENOPAUSE PO  Take 1 tablet by mouth daily.        Discharge Condition: stable Disposition: 01-Home or Self Care   Consults:cardiology  Significant Diagnostic Studies: Dg Chest 2 View  04/09/2014   CLINICAL DATA:  Bleeding Mid chest pain.  EXAM: CHEST  2 VIEW  COMPARISON:  08/19/2013  FINDINGS: Mild hyperinflation. The heart size and mediastinal contours are within normal limits. Slight fibrosis in the lung bases. No focal airspace disease or consolidation. Postoperative changes in the right shoulder. The visualized skeletal structures are unremarkable.  IMPRESSION: No active cardiopulmonary disease.   Electronically Signed   By: Lucienne Capers M.D.   On: 04/09/2014 02:22       Microbiology: No results found for this or any previous visit (from the past 240 hour(s)).   Labs: Results for orders placed or performed during the hospital encounter of 04/09/14 (from the past 48 hour(s))  CBC     Status: Abnormal   Collection Time: 04/09/14  2:16 AM  Result Value Ref Range   WBC 5.2 4.0 - 10.5 K/uL   RBC 4.44 3.87 - 5.11 MIL/uL   Hemoglobin 11.3 (L) 12.0 - 15.0 g/dL   HCT 36.7 36.0 - 46.0 %   MCV 82.7 78.0 - 100.0 fL   MCH 25.5 (L) 26.0 - 34.0 pg   MCHC 30.8 30.0 - 36.0 g/dL   RDW 17.2 (H) 11.5 - 15.5 %   Platelets 227 150 - 400 K/uL  Basic metabolic panel     Status: Abnormal   Collection Time: 04/09/14  2:16 AM  Result Value Ref Range   Sodium 139 137 - 147 mEq/L   Potassium 4.0 3.7 - 5.3 mEq/L   Chloride 100 96 - 112 mEq/L   CO2 25 19 - 32 mEq/L   Glucose, Bld 108 (H) 70 - 99 mg/dL   BUN 13 6 - 23 mg/dL   Creatinine, Ser 0.75 0.50 - 1.10 mg/dL   Calcium 10.3 8.4 - 10.5 mg/dL   GFR calc non Af Amer 85 (L) >90 mL/min   GFR calc Af Amer >90 >90 mL/min    Comment: (NOTE) The eGFR has been calculated using the CKD EPI equation. This calculation has not been validated in all clinical situations. eGFR's persistently <90 mL/min signify possible Chronic Kidney Disease.    Anion gap 14 5 - 15  I-stat troponin, ED (not at [facility])     Status: None   Collection Time: 04/09/14  2:31 AM  Result Value Ref Range   Troponin i, poc 0.01 0.00 - 0.08 ng/mL   Comment 3  Comment: Due to the release kinetics of cTnI, a negative result within the first hours of the onset of symptoms does not rule out myocardial infarction with certainty. If myocardial infarction is still suspected, repeat the test at appropriate intervals.   D-dimer, quantitative     Status: None   Collection Time: 04/09/14  3:44 AM  Result Value Ref Range   D-Dimer, Quant 0.42 0.00 - 0.48 ug/mL-FEU    Comment:        AT THE INHOUSE ESTABLISHED CUTOFF VALUE OF 0.48 ug/mL FEU, THIS ASSAY HAS BEEN DOCUMENTED IN THE LITERATURE TO HAVE A SENSITIVITY AND NEGATIVE PREDICTIVE VALUE OF AT LEAST 98 TO 99%.  THE TEST RESULT SHOULD BE CORRELATED WITH AN ASSESSMENT OF THE CLINICAL PROBABILITY OF DVT / VTE.   Troponin I-serum (one time only)     Status: None   Collection Time: 04/09/14  8:35 AM  Result Value Ref Range   Troponin I <0.30 <0.30 ng/mL    Comment:        Due to the release kinetics of cTnI, a negative result within the first hours of the onset of symptoms does not rule out myocardial infarction with  certainty. If myocardial infarction is still suspected, repeat the test at appropriate intervals.   Hepatic function panel     Status: Abnormal   Collection Time: 04/09/14  8:35 AM  Result Value Ref Range   Total Protein 6.9 6.0 - 8.3 g/dL   Albumin 3.4 (L) 3.5 - 5.2 g/dL   AST 21 0 - 37 U/L   ALT 11 0 - 35 U/L   Alkaline Phosphatase 69 39 - 117 U/L   Total Bilirubin 0.4 0.3 - 1.2 mg/dL   Bilirubin, Direct <0.2 0.0 - 0.3 mg/dL   Indirect Bilirubin NOT CALCULATED 0.3 - 0.9 mg/dL     [name] is a 68 y.o. female withNO SIGNIFICANT Past medical history. The patient presented with complaints of chest pain. She mentions that the pain started at rest while she was sitting at the seminar the last evening. It got worse with position changes, laying down made it worse sitting up made it better. She is currently pain free. The pain was not related to exertion, there was no radiation or other associated symptoms.  She denies any recent URI or other viral illness. She was around small children the last weekend. She states that she had similar episodes in the past. No h/o smoking, no family history of premature CAD, but CHF in her father and 19 year old daughter.   HOSPITAL COURSE  1. Chest pain - negative troponin x 2, negative D dimer, ECG not suggestive of pericarditis, however clinical presentation and rub on physical exam consistent with the diagnosis of an acute pericarditis. Cardiology has ordered stress MRI (with use of Lexiscan) to evaluate for pericarditis and to rule out ischemia, results are pending at this time   3.hypotension, blood pressure soft when the patient came in,not improved  4. Lipids - LDL 56, triglycerides 32  Discharge Exam:  Blood pressure 86/49, pulse 64, temperature 98.2 F (36.8 C), temperature source Oral, resp. rate 16, weight 78.245 kg (172 lb 8 oz), SpO2 100 %.  Psych: Normal affect. Neuro: Alert and oriented X 3. Moves all extremities  spontaneously. HEENT: Normal Neck: Supple without bruits or JVD. Lungs: Resp regular and unlabored, CTA. Heart: RRR no s3, s4, or murmurs. Systolic and diastolic rub Abdomen: Soft, non-tender, non-distended, BS + x 4.  Extremities:  No clubbing, cyanosis or edema. DP/PT/Radials 2+ and equal bilaterally.         Follow-up Information    Follow up with [name]., MD. Schedule an appointment as soon as possible for a visit in 1 week.   Specialty:  Internal Medicine   Contact information:   [address] [place] [address] [phone]       Signed: Reyne Dumas 04/09/2014, 9:51 AM

## 2014-04-09 NOTE — ED Notes (Signed)
Presents with sternal chest pain described as "just a pain" began at 20:30 yesterday and is constant associated with SOB, pain has decreased in intensity from a 9/10 to a 6/10.  Laying flat makes pain worse, nothing makes pain better. Denies nausea and vomiting and dizziness.

## 2014-04-09 NOTE — H&P (Signed)
[facility] History and Physical  Patient: [name]  ZOX:[id number]  DOB: 03-18-1946  DOS: the patient was seen and examined on 04/09/2014 PCP: Alva GarnetSHELTON,KIMBERLY R., MD  Chief Complaint: CHEST PAIN  HPI: Joan GrillsDiane [name] is a 68 y.o. female withNO SIGNIFICANT Past medical history. The patient presented with complaints of chest pain. She mentions that when she woke up this morning she was asymptomatic and then she went to the church and when she came back she was not feeling good and later on in the afternoon she started having substernal chest pain which was sharp and pressure-like associated with shortness of breath and worsening with lying down. She also had some chills but denies any fever or cough or runny nose. She denies any similar episodes in recent past but did have one episode a year ago. She also had a stress test a few years ago which was negative. She denies any recent travel or recent surgery or sick contacts. She denies any swelling of her legs or leg tenderness. She denies any changes in her medication.  The patient is coming from home. And at her baseline independent for most of her ADL.  Review of Systems: as mentioned in the history of present illness.  A Comprehensive review of the other systems is negative.  Past Medical History  Diagnosis Date  . Chest pain   . Head pain   . Dizziness    Past Surgical History  Procedure Laterality Date  . Tubal ligation    . Appendectomy    . Tonsillectomy    . Cervical cone biopsy     Social History:  reports that she has never smoked. She has never used smokeless tobacco. She reports that she does not drink alcohol or use illicit drugs.  No Known Allergies  Family History  Problem Relation Age of Onset  . Coronary artery disease    . Heart disease Father   . Heart failure Father     Prior to Admission medications   Medication Sig Start Date End Date Taking? Authorizing Provider  Ascorbic Acid (VITAMIN C)  1000 MG tablet Take 1,000 mg by mouth daily.   Yes Historical Provider, MD  BIOTIN PO Take 1 tablet by mouth daily.   Yes Historical Provider, MD  Cholecalciferol (VITAMIN D-3 PO) Take 1 tablet by mouth daily.    Yes Historical Provider, MD  cyanocobalamin 1000 MCG tablet Take 100 mcg by mouth daily.   Yes Historical Provider, MD  LECITHIN PO Take 1 tablet by mouth daily.   Yes Historical Provider, MD  Multiple Vitamins-Minerals (WOMENS BONE HEALTH PO) Take 3 tablets by mouth daily.   Yes Historical Provider, MD  Nutritional Supplements (WOMENS FORMULA MENOPAUSE PO) Take 1 tablet by mouth daily.     Historical Provider, MD    Physical Exam: Filed Vitals:   04/09/14 0500 04/09/14 0515 04/09/14 0530 04/09/14 0545  BP: 109/60 112/60 101/56 107/54  Pulse: 74 69 73 76  Temp:    98.6 F (37 C)  TempSrc:    Oral  Resp: 21 24 18 18   SpO2: 99% 99% 99% 96%    General: Alert, Awake and Oriented to Time, Place and Person. Appear in mild distress Eyes: PERRL ENT: Oral Mucosa clear moist. Neck: no JVD Cardiovascular: S1 and S2 Present, no Murmur, Peripheral Pulses Present Respiratory: Bilateral Air entry equal and Decreased, Clear to Auscultation, noCrackles, no wheezes Abdomen: Bowel Sound present, Soft and non tender Skin: no Rash Extremities: no Pedal edema, no  calf tenderness Neurologic: Grossly no focal neuro deficit.  Labs on Admission:  CBC:  Recent Labs Lab 04/09/14 0216  WBC 5.2  HGB 11.3*  HCT 36.7  MCV 82.7  PLT 227    CMP     Component Value Date/Time   NA 139 04/09/2014 0216   K 4.0 04/09/2014 0216   CL 100 04/09/2014 0216   CO2 25 04/09/2014 0216   GLUCOSE 108* 04/09/2014 0216   BUN 13 04/09/2014 0216   CREATININE 0.75 04/09/2014 0216   CALCIUM 10.3 04/09/2014 0216   GFRNONAA 85* 04/09/2014 0216   GFRAA >90 04/09/2014 0216    No results for input(s): LIPASE, AMYLASE in the last 168 hours. No results for input(s): AMMONIA in the last 168 hours.  No  results for input(s): CKTOTAL, CKMB, CKMBINDEX, TROPONINI in the last 168 hours. BNP (last 3 results)  Recent Labs  08/19/13 1229  PROBNP 144.0*    Radiological Exams on Admission: Dg Chest 2 View  04/09/2014   CLINICAL DATA:  Bleeding Mid chest pain.  EXAM: CHEST  2 VIEW  COMPARISON:  08/19/2013  FINDINGS: Mild hyperinflation. The heart size and mediastinal contours are within normal limits. Slight fibrosis in the lung bases. No focal airspace disease or consolidation. Postoperative changes in the right shoulder. The visualized skeletal structures are unremarkable.  IMPRESSION: No active cardiopulmonary disease.   Electronically Signed   By: Burman NievesWilliam  Stevens M.D.   On: 04/09/2014 02:22    EKG: Independently reviewed. normal sinus rhythm, nonspecific ST and T waves changes, diffuse PR depression noted.  Assessment/Plan Principal Problem:   Chest pain   1. Chest pain The patient is presenting with complaints of chest pain which is pruritic in nature associated with worsening of the position. D-dimer is negative EKG shows diffuse PR depression. Initial troponin is negative. Patient at present is asymptomatic. With this most likely patient's current presentation is musculoskeletal but possibility of pericarditis cannot be ruled out. We will continue to monitor her on telemetry and SERIAL troponin.  Advance goals of care discussion: full code   DVT Prophylaxis: subcutaneous Heparin Family Communication: family was present at bedside, opportunity was given to ask question and all questions were answered satisfactorily at the time of interview. Disposition: Admitted to observation telemetry unit.  Author: Lynden OxfordPranav Taber Sweetser, MD [facility] Pager: [phone] 04/09/2014, 6:35 AM    If 7PM-7AM, please contact night-coverage [email or web address] Password [email or web address]

## 2014-04-09 NOTE — Progress Notes (Signed)
UR completed 

## 2014-04-10 DIAGNOSIS — I519 Heart disease, unspecified: Secondary | ICD-10-CM

## 2014-04-10 DIAGNOSIS — R072 Precordial pain: Secondary | ICD-10-CM

## 2014-04-10 DIAGNOSIS — I309 Acute pericarditis, unspecified: Secondary | ICD-10-CM

## 2014-04-10 MED ORDER — PANTOPRAZOLE SODIUM 40 MG PO TBEC: 40.0000 mg | DELAYED_RELEASE_TABLET | Freq: Every day | ORAL | Status: DC

## 2014-04-10 MED ORDER — COLCHICINE 0.6 MG PO TABS: 0.6000 mg | ORAL_TABLET | Freq: Two times a day (BID) | ORAL | Status: DC

## 2014-04-10 MED ORDER — HEPARIN (PORCINE) IN NACL 100-0.45 UNIT/ML-% IJ SOLN
INTRAMUSCULAR | Status: AC
Start: 2014-04-10 — End: 2014-04-11
  Filled 2014-04-10: qty 250

## 2014-04-10 MED ORDER — COLCHICINE 0.6 MG PO TABS
0.6000 mg | ORAL_TABLET | Freq: Two times a day (BID) | ORAL | Status: DC
  Administered 2014-04-10: 0.6 mg via ORAL
  Filled 2014-04-10: qty 1

## 2014-04-10 MED ORDER — NAPROXEN 250 MG PO TABS
375.0000 mg | ORAL_TABLET | Freq: Three times a day (TID) | ORAL | Status: DC
  Administered 2014-04-10: 375 mg via ORAL
  Filled 2014-04-10: qty 2

## 2014-04-10 MED ORDER — NAPROXEN 375 MG PO TABS: 375.0000 mg | ORAL_TABLET | Freq: Three times a day (TID) | ORAL | Status: DC

## 2014-04-10 NOTE — Progress Notes (Signed)
*  PRELIMINARY RESULTS* Echocardiogram 2D Echocardiogram has been performed.  [name], [name] 04/10/2014, 9:06 AM

## 2014-04-10 NOTE — Progress Notes (Signed)
Patient ID: [name], female   DOB: 1945/07/21, 68 y.o.   MRN: [id number]    Subjective:  Less pain per husband   Objective:  Filed Vitals:   04/09/14 1845 04/09/14 2023 04/10/14 0007 04/10/14 0535  BP:  91/37 99/49 110/59  Pulse:  91 73 70  Temp:  98.5 F (36.9 C) 98.9 F (37.2 C) 97.9 F (36.6 C)  TempSrc:  Oral Oral Oral  Resp:  16 16 16   Height: 5\' 6"  (1.676 m)     Weight: 77.111 kg (170 lb)   75.615 kg (166 lb 11.2 oz)  SpO2:  97% 97% 98%     Physical Exam: Affect appropriate Healthy:  appears stated age HEENT: normal Neck supple with no adenopathy JVP normal no bruits no thyromegaly Lungs clear with no wheezing and good diaphragmatic motion Heart:  S1/S2 no murmur, RUB  Abdomen: benighn, BS positve, no tenderness, no AAA no bruit.  No HSM or HJR Distal pulses intact with no bruits No edema Neuro non-focal Skin warm and dry No muscular weakness   Lab Results: Basic Metabolic Panel:  Recent Labs  24/40/1009/08/19 0216  NA 139  K 4.0  CL 100  CO2 25  GLUCOSE 108*  BUN 13  CREATININE 0.75  CALCIUM 10.3   Liver Function Tests:  Recent Labs  04/09/14 0835  AST 21  ALT 11  ALKPHOS 69  BILITOT 0.4  PROT 6.9  ALBUMIN 3.4*   CBC:  Recent Labs  04/09/14 0216  WBC 5.2  HGB 11.3*  HCT 36.7  MCV 82.7  PLT 227   Cardiac Enzymes:  Recent Labs  04/09/14 0835 04/09/14 1600 04/09/14 2037  TROPONINI <0.30 <0.30 <0.30   BNP: Invalid input(s): POCBNP D-Dimer:  Recent Labs  04/09/14 0344  DDIMER 0.42   Hemoglobin A1C:  Recent Labs  04/09/14 0835  HGBA1C 5.5   Fasting Lipid Panel:  Recent Labs  04/09/14 0835  CHOL 124  HDL 62  LDLCALC 56  TRIG 32  CHOLHDL 2.0   Thyroid Function Tests:  Recent Labs  04/09/14 1000  TSH 0.352    Imaging: Dg Chest 2 View  04/09/2014   CLINICAL DATA:  Bleeding Mid chest pain.  EXAM: CHEST  2 VIEW  COMPARISON:  08/19/2013  FINDINGS: Mild hyperinflation. The heart size and mediastinal  contours are within normal limits. Slight fibrosis in the lung bases. No focal airspace disease or consolidation. Postoperative changes in the right shoulder. The visualized skeletal structures are unremarkable.  IMPRESSION: No active cardiopulmonary disease.   Electronically Signed   By: Burman NievesWilliam  Stevens M.D.   On: 04/09/2014 02:22   Mr Cardiac Stress Test  04/09/2014   CLINICAL DATA:  68 year old female with chest pain. Evaluate for ischemia and pericarditis.  EXAM: CARDIAC MRI  TECHNIQUE: The patient was scanned on a 1.5 Tesla GE magnet. A dedicated cardiac coil was used. Functional imaging was done using Fiesta sequences. 2,3, and 4 chamber views were done to assess for RWMA's. Modified Simpson's rule using a short axis stack was used to calculate an ejection fraction on a dedicated work Research officer, trade [employer]station using Circle software. The patient received 28 cc of Multihance. After 10 minutes inversion recovery sequences were used to assess for infiltration and scar tissue. 0.4 mg of iv Regadenoson was used for pharmacologic stress testing. Resting HR increased from 80 to 105 BPM. The patient experienced mild SOB consistent with regadenoson injection.  CONTRAST:  28 cc  of Multihance  FINDINGS: 1. Normal  left ventricular size, thickness and systolic function (LVEF 64%). No regional wall motion abnormalities.  LVEDD:  50 mm  LVESD:  26 mm  LVEDV:  122 ml  LVESV:  44 ml  SV:  80 ml  CO:  7.5 L/minute  Myocardial mass:  96 g  2. Normal right ventricular size, thickness and systolic function (LVEF 60%). No regional wall motion abnormalities.  RVEDV:  134 ml  RVESV:  54 ml  SV:  80 ml  CO:  7.5 L/minute  3. Normal bi-atrial size. Trace mitral and tricuspid regurgitation.  4. Normal size of the aortic root, thoracic aorta and pulmonary artery.  5.  No rest or stress induced perfusion defect.  6. There is circumferential late gadolinium enhancement of the pericardium. Trivial pericardial effusion.  IMPRESSION: 1. Normal left  ventricular size, thickness and systolic function (LVEF 64%). No regional wall motion abnormalities.  2. Normal right ventricular size, thickness and systolic function (LVEF 60%). No regional wall motion abnormalities.  3. Normal bi-atrial size. Trace mitral and tricuspid regurgitation.  4. No rest or stress induced perfusion defect. No evidence of scar or ischemia.  5. There is circumferential late gadolinium enhancement of the pericardium consistent with acute pericarditis. Treatment with antiinflammatory drugs (NSAIDS, Colchicine) is recommended.  Tobias AlexanderKatarina Nelson   Electronically Signed   By: Tobias AlexanderKatarina  Nelson   On: 04/09/2014 20:05    Cardiac Studies:  ECG:  SR LAD no changes of acute pericarditis    Telemetry:  NSR no arrhythmia   Echo:  Pending   Medications:   . colchicine  0.6 mg Oral BID  . naproxen  375 mg Oral TID WC       Assessment/Plan:  Chest Pain:  R/O  Stress MRI normal per Dr Delton SeeNelson   Also with evidence of pericarditis on gadolinium images.  Have d/c ASA/Tyleol and written her for tid naproxen and colchicine  Discussed with Dr Jomarie LongsJoseph d/c home latter today after echo with outpatient f/u Dr Vickii ChafeNelson  Adelaide Pfefferkorn [facility] LLC[facility]an 04/10/2014, 9:32 AM

## 2014-04-10 NOTE — Discharge Summary (Signed)
Physician Discharge Summary  Joan GrillsDiane [name] XBM:[id number] DOB: Jun 08, 1945 DOA: 04/09/2014  PCP: Alva GarnetSHELTON,KIMBERLY R., MD  Admit date: 04/09/2014 Discharge date: 04/10/2014  Time spent: 45 minutes  Recommendations for Outpatient Follow-up:  1. Dr. Delton SeeNelson in 2 weeks  Discharge Diagnoses:  Principal Problem:   Chest pain Active Problems:   Acute pericarditis   Discharge Condition: stable  Diet recommendation: low sodium  Filed Weights   04/09/14 0700 04/09/14 1845 04/10/14 0535  Weight: 78.245 kg (172 lb 8 oz) 77.111 kg (170 lb) 75.615 kg (166 lb 11.2 oz)    History of present illness:  [name] is a 68 y.o. female withNO SIGNIFICANT Past medical history. The patient presented with complaints of chest pain. She mentions that when she woke up this morning she was asymptomatic and then she went to the church and when she came back she was not feeling good and later on in the afternoon she started having substernal chest pain which was sharp and pressure-like associated with shortness of breath and worsening with lying down. She also had some chills but denies any fever or cough or runny nose. She denies any similar episodes in recent past but did have one episode a year ago. She also had a stress test a few years ago which was negative  Hospital Course:  1. Chest pain - negative troponin x 3, negative D dimer, ECHO not suggestive of pericarditis, however clinical presentation and rub on physical exam consistent with the diagnosis of an acute pericarditis. Cardiology has ordered stress MRI which showed circumferential late gadolinium enhancement of the pericardium consistent with acute pericarditis. -Followed by Cardiology and recommended to start Naproxen 375mg  TID and Colchicine BID and FU with DR.Delton SeeNelson -i started PPI at discharge due to NSAID use  3.HTN -BP soft on admission, now improved and stable  4. Lipids - LDL 56, triglycerides 32   Procedures:  ECHO Study  Conclusions - Left ventricle: The cavity size was normal. Wall thickness was normal. Systolic function was vigorous. The estimated ejection fraction was in the range of 65% to 70%. Wall motion was normal; there were no regional wall motion abnormalities. Doppler parameters are consistent with abnormal left ventricular relaxation (grade 1 diastolic dysfunction). - Right ventricle: The cavity size was mildly dilated. Wall thickness was normal. - Right atrium: The atrium was mildly dilated. - Pulmonary arteries: Systolic pressure was mildly increased  Cardiac MRI: IMPRESSION: 1. Normal left ventricular size, thickness and systolic function (LVEF 64%). No regional wall motion abnormalities.  2. Normal right ventricular size, thickness and systolic function (LVEF 60%). No regional wall motion abnormalities.  3. Normal bi-atrial size. Trace mitral and tricuspid regurgitation.  4. No rest or stress induced perfusion defect. No evidence of scar or ischemia.  5. There is circumferential late gadolinium enhancement of the pericardium consistent with acute pericarditis  Consultations:  Cardiology  Discharge Exam: Filed Vitals:   04/10/14 0535  BP: 110/59  Pulse: 70  Temp: 97.9 F (36.6 C)  Resp: 16    General: AAOx3 Cardiovascular: S1S2/RRR Respiratory: CTAB  Discharge Instructions You were cared for by a hospitalist during your hospital stay. If you have any questions about your discharge medications or the care you received while you were in the hospital after you are discharged, you can call the unit and asked to speak with the hospitalist on call if the hospitalist that took care of you is not available. Once you are discharged, your primary care physician will handle any further medical issues.  Please note that NO REFILLS for any discharge medications will be authorized once you are discharged, as it is imperative that you return to your primary care physician  (or establish a relationship with a primary care physician if you do not have one) for your aftercare needs so that they can reassess your need for medications and monitor your lab values.  Discharge Instructions    Diet general    Complete by:  As directed      Increase activity slowly    Complete by:  As directed           Current Discharge Medication List    START taking these medications   Details  colchicine 0.6 MG tablet Take 1 tablet (0.6 mg total) by mouth 2 (two) times daily. Qty: 30 tablet, Refills: 1    naproxen (NAPROSYN) 375 MG tablet Take 1 tablet (375 mg total) by mouth 3 (three) times daily with meals. Qty: 45 tablet, Refills: 0      CONTINUE these medications which have NOT CHANGED   Details  Ascorbic Acid (VITAMIN C) 1000 MG tablet Take 1,000 mg by mouth daily.    BIOTIN PO Take 1 tablet by mouth daily.    Cholecalciferol (VITAMIN D-3 PO) Take 1 tablet by mouth daily.     cyanocobalamin 1000 MCG tablet Take 100 mcg by mouth daily.    LECITHIN PO Take 1 tablet by mouth daily.    Multiple Vitamins-Minerals (WOMENS BONE HEALTH PO) Take 3 tablets by mouth daily.    Nutritional Supplements (WOMENS FORMULA MENOPAUSE PO) Take 1 tablet by mouth daily.        No Known Allergies Follow-up Information    Follow up with Alva Garnet., MD. Schedule an appointment as soon as possible for a visit in 1 week.   Specialty:  Internal Medicine   Contact information:   [address] [place] [address] [phone]       Follow up with Lars Masson, MD. Schedule an appointment as soon as possible for a visit in 2 weeks.   Specialty:  Cardiology   Contact information:   [address] [place] [address] [phone]        The results of significant diagnostics from this hospitalization (including imaging, microbiology, ancillary and laboratory) are listed below for reference.    Significant Diagnostic Studies: Dg  Chest 2 View  04/09/2014   CLINICAL DATA:  Bleeding Mid chest pain.  EXAM: CHEST  2 VIEW  COMPARISON:  08/19/2013  FINDINGS: Mild hyperinflation. The heart size and mediastinal contours are within normal limits. Slight fibrosis in the lung bases. No focal airspace disease or consolidation. Postoperative changes in the right shoulder. The visualized skeletal structures are unremarkable.  IMPRESSION: No active cardiopulmonary disease.   Electronically Signed   By: Burman Nieves M.D.   On: 04/09/2014 02:22   Mr Cardiac Stress Test  04/09/2014   CLINICAL DATA:  68 year old female with chest pain. Evaluate for ischemia and pericarditis.  EXAM: CARDIAC MRI  TECHNIQUE: The patient was scanned on a 1.5 Tesla GE magnet. A dedicated cardiac coil was used. Functional imaging was done using Fiesta sequences. 2,3, and 4 chamber views were done to assess for RWMA's. Modified Simpson's rule using a short axis stack was used to calculate an ejection fraction on a dedicated work [employer] officer, [employer]. The patient received 28 cc of Multihance. After 10 minutes inversion recovery sequences were used to assess  for infiltration and scar tissue. 0.4 mg of iv Regadenoson was used for pharmacologic stress testing. Resting HR increased from 80 to 105 BPM. The patient experienced mild SOB consistent with regadenoson injection.  CONTRAST:  28 cc  of Multihance  FINDINGS: 1. Normal left ventricular size, thickness and systolic function (LVEF 64%). No regional wall motion abnormalities.  LVEDD:  50 mm  LVESD:  26 mm  LVEDV:  122 ml  LVESV:  44 ml  SV:  80 ml  CO:  7.5 L/minute  Myocardial mass:  96 g  2. Normal right ventricular size, thickness and systolic function (LVEF 60%). No regional wall motion abnormalities.  RVEDV:  134 ml  RVESV:  54 ml  SV:  80 ml  CO:  7.5 L/minute  3. Normal bi-atrial size. Trace mitral and tricuspid regurgitation.  4. Normal size of the aortic root, thoracic aorta and pulmonary artery.  5.  No rest  or stress induced perfusion defect.  6. There is circumferential late gadolinium enhancement of the pericardium. Trivial pericardial effusion.  IMPRESSION: 1. Normal left ventricular size, thickness and systolic function (LVEF 64%). No regional wall motion abnormalities.  2. Normal right ventricular size, thickness and systolic function (LVEF 60%). No regional wall motion abnormalities.  3. Normal bi-atrial size. Trace mitral and tricuspid regurgitation.  4. No rest or stress induced perfusion defect. No evidence of scar or ischemia.  5. There is circumferential late gadolinium enhancement of the pericardium consistent with acute pericarditis. Treatment with antiinflammatory drugs (NSAIDS, Colchicine) is recommended.  Tobias AlexanderKatarina Nelson   Electronically Signed   By: Tobias AlexanderKatarina  Nelson   On: 04/09/2014 20:05    Microbiology: No results found for this or any previous visit (from the past 240 hour(s)).   Labs: Basic Metabolic Panel:  Recent Labs Lab 04/09/14 0216  NA 139  K 4.0  CL 100  CO2 25  GLUCOSE 108*  BUN 13  CREATININE 0.75  CALCIUM 10.3   Liver Function Tests:  Recent Labs Lab 04/09/14 0835  AST 21  ALT 11  ALKPHOS 69  BILITOT 0.4  PROT 6.9  ALBUMIN 3.4*   No results for input(s): LIPASE, AMYLASE in the last 168 hours. No results for input(s): AMMONIA in the last 168 hours. CBC:  Recent Labs Lab 04/09/14 0216  WBC 5.2  HGB 11.3*  HCT 36.7  MCV 82.7  PLT 227   Cardiac Enzymes:  Recent Labs Lab 04/09/14 0835 04/09/14 1600 04/09/14 2037  TROPONINI <0.30 <0.30 <0.30   BNP: BNP (last 3 results)  Recent Labs  08/19/13 1229  PROBNP 144.0*   CBG: No results for input(s): GLUCAP in the last 168 hours.     SignedZannie Cove:  Rashed Edler  [facility] 04/10/2014, 1:16 PM

## 2014-04-10 NOTE — Plan of Care (Signed)
Problem: Consults Goal: Chest Pain Patient Education (See Patient Education module for education specifics.) Outcome: Completed/Met Date Met:  04/10/14 Goal: Skin Care Protocol Initiated - if Braden Score 18 or less If consults are not indicated, leave blank or document N/A Outcome: Not Applicable Date Met:  03/23/50 Goal: Tobacco Cessation referral if indicated Outcome: Not Applicable Date Met:  02/58/52 Goal: Nutrition Consult-if indicated Outcome: Not Applicable Date Met:  77/82/42 Goal: Diabetes Guidelines if Diabetic/Glucose > 140 If diabetic or lab glucose is > 140 mg/dl - Initiate Diabetes/Hyperglycemia Guidelines & Document Interventions  Outcome: Not Applicable Date Met:  35/36/14  Problem: Phase II Progression Outcomes Goal: Stress Test if indicated Outcome: Completed/Met Date Met:  04/10/14

## 2014-04-10 NOTE — Progress Notes (Signed)
Patient discharge with discharge instruction given.  Denies chest pain,  Vital sign stable.  No sign of distress upon discharge home.

## 2014-04-15 ENCOUNTER — Other Ambulatory Visit: Payer: Self-pay | Admitting: *Deleted

## 2014-04-15 ENCOUNTER — Encounter: Payer: Self-pay | Admitting: *Deleted

## 2014-04-22 ENCOUNTER — Encounter: Payer: Self-pay | Admitting: Cardiology

## 2014-04-22 ENCOUNTER — Ambulatory Visit (INDEPENDENT_AMBULATORY_CARE_PROVIDER_SITE_OTHER): Payer: Medicare Other | Admitting: Cardiology

## 2014-04-22 VITALS — BP 112/64 | HR 62 | Ht 66.0 in | Wt 168.0 lb

## 2014-04-22 DIAGNOSIS — I309 Acute pericarditis, unspecified: Secondary | ICD-10-CM

## 2014-04-22 NOTE — Patient Instructions (Signed)
Your physician recommends that you schedule a follow-up appointment in: 1 MONTH WITH DR. Delton SeeNELSON

## 2014-04-22 NOTE — Progress Notes (Signed)
Patient Name: [name] Date of Encounter: 04/22/2014  Primary Care Provider:  Alva Garnet., MD Primary Cardiologist:  Lars Masson   Problem List   Past Medical History  Diagnosis Date  . Chest pain   . Head pain   . Dizziness    Past Surgical History  Procedure Laterality Date  . Tubal ligation    . Appendectomy    . Tonsillectomy    . Cervical cone biopsy      Allergies  No Known Allergies  HPI  [name] is a 68 y.o. female with NO SIGNIFICANT Past medical history. The patient presented with complaints of chest pain. She mentions that when she woke up this morning she was asymptomatic and then she went to the church and when she came back she was not feeling good and later on in the afternoon she started having substernal chest pain which was sharp and pressure-like associated with shortness of breath and worsening with lying down. She also had some chills but denies any fever or cough or runny nose. She denies any similar episodes in recent past but did have one episode a year ago. She also had a stress test a few years ago which was negative Hospital Course:  1. Chest pain - negative troponin x 3, negative D dimer, ECHO not suggestive of pericarditis, however clinical presentation and rub on physical exam consistent with the diagnosis of an acute pericarditis. Cardiology has ordered stress MRI which showed circumferential late gadolinium enhancement of the pericardium consistent with acute pericarditis. -Followed by Cardiology and recommended to start Naproxen 375mg  TID and Colchicine BID and FU with DR.Delton See. Started on PPI.  04/22/2014 - the patient is coming for follow-up visit 10 days after hospital discharge. She states that initially her symptoms improved however she stop using NSAIDs due to significant abdominal pain and her chest pain free cured especially when she is reclining. She has several questions regarding her condition,  prognosis, physical activity diet and additional treatment.   Home Medications  Prior to Admission medications   Medication Sig Start Date End Date Taking? Authorizing Provider  Ascorbic Acid (VITAMIN C) 1000 MG tablet Take 1,000 mg by mouth daily.   Yes Historical Provider, MD  BIOTIN PO Take 1 tablet by mouth daily.   Yes Historical Provider, MD  Capsicum, Cayenne, (CAYENNE PO) Take by mouth daily.   Yes Historical Provider, MD  Cholecalciferol (VITAMIN D-3 PO) Take 1 tablet by mouth daily.    Yes Historical Provider, MD  colchicine 0.6 MG tablet Take 1 tablet (0.6 mg total) by mouth 2 (two) times daily. 04/10/14  Yes Zannie Cove, MD  cyanocobalamin 1000 MCG tablet Take 100 mcg by mouth daily.   Yes Historical Provider, MD  LECITHIN PO Take 1 tablet by mouth daily.   Yes Historical Provider, MD  MAGNESIUM GLYCINATE PLUS PO Take 800 mg by mouth daily.   Yes Historical Provider, MD  Multiple Vitamins-Minerals (WOMENS BONE HEALTH PO) Take 3 tablets by mouth daily.   Yes Historical Provider, MD  naproxen (NAPROSYN) 375 MG tablet Take 1 tablet (375 mg total) by mouth 3 (three) times daily with meals. 04/10/14  Yes Zannie Cove, MD  NON FORMULARY Thermo Herbal Tea Drink daily   Yes Historical Provider, MD  Nutritional Supplements (WOMENS FORMULA MENOPAUSE PO) Take 1 tablet by mouth daily.    Yes Historical Provider, MD  pantoprazole (PROTONIX) 40 MG tablet Take 1 tablet (40 mg total) by mouth daily. 04/10/14  Yes Zannie CovePreetha Joseph, MD    Family History  Family History  Problem Relation Age of Onset  . Coronary artery disease    . Heart disease Father   . Heart failure Father   . Cancer Mother     Social History  History   Social History  . Marital Status: Married    Spouse Name: Athelstan    Number of Children: 6  . Years of Education: 4619   Occupational History  . Not on file.   Social History Main Topics  . Smoking status: Never Smoker   . Smokeless tobacco: Never Used  .  Alcohol Use: No  . Drug Use: No  . Sexual Activity: Not on file   Other Topics Concern  . Not on file   Social History Narrative   Patient is married Conservation officer, historic buildings([place]) and lives at home with her husband and her daughter.   Patient has five living children and one is deceased.   Patient is a retired Runner, broadcasting/film/videoteacher.   Patient has a Scientist, water qualityMasters.   Patient is right handed.   Patient drinks very little caffeine.     Review of Systems, as per HPI, otherwise negative General:  No chills, fever, night sweats or weight changes.  Cardiovascular:  No chest pain, dyspnea on exertion, edema, orthopnea, palpitations, paroxysmal nocturnal dyspnea. Dermatological: No rash, lesions/masses Respiratory: No cough, dyspnea Urologic: No hematuria, dysuria Abdominal:   No nausea, vomiting, diarrhea, bright red blood per rectum, melena, or hematemesis Neurologic:  No visual changes, wkns, changes in mental status. All other systems reviewed and are otherwise negative except as noted above.  Physical Exam  Blood pressure 112/64, pulse 62, height 5\' 6"  (1.676 m), weight 168 lb (76.204 kg), SpO2 97 %.  General: Pleasant, NAD Psych: Normal affect. Neuro: Alert and oriented X 3. Moves all extremities spontaneously. HEENT: Normal  Neck: Supple without bruits or JVD. Lungs:  Resp regular and unlabored, CTA. Heart: RRR no s3, s4, or murmurs. Abdomen: Soft, non-tender, non-distended, BS + x 4.  Extremities: No clubbing, cyanosis or edema. DP/PT/Radials 2+ and equal bilaterally.  Labs:  No results for input(s): CKTOTAL, CKMB, TROPONINI in the last 72 hours. Lab Results  Component Value Date   WBC 5.2 04/09/2014   HGB 11.3* 04/09/2014   HCT 36.7 04/09/2014   MCV 82.7 04/09/2014   PLT 227 04/09/2014    Lab Results  Component Value Date   DDIMER 0.42 04/09/2014   Invalid input(s): POCBNP    Component Value Date/Time   NA 139 04/09/2014 0216   K 4.0 04/09/2014 0216   CL 100 04/09/2014 0216   CO2 25 04/09/2014  0216   GLUCOSE 108* 04/09/2014 0216   BUN 13 04/09/2014 0216   CREATININE 0.75 04/09/2014 0216   CALCIUM 10.3 04/09/2014 0216   PROT 6.9 04/09/2014 0835   ALBUMIN 3.4* 04/09/2014 0835   AST 21 04/09/2014 0835   ALT 11 04/09/2014 0835   ALKPHOS 69 04/09/2014 0835   BILITOT 0.4 04/09/2014 0835   GFRNONAA 85* 04/09/2014 0216   GFRAA >90 04/09/2014 0216   Lab Results  Component Value Date   CHOL 124 04/09/2014   HDL 62 04/09/2014   LDLCALC 56 04/09/2014   TRIG 32 04/09/2014    Accessory Clinical Findings  Echocardiogram - 04/09/2014 Left ventricle: The cavity size was normal. Wall thickness was normal. Systolic function was vigorous. The estimated ejection fraction was in the range of 65% to 70%. Wall motion was normal; there were no regional  wall motion abnormalities. Doppler parameters are consistent with abnormal left ventricular relaxation (grade 1 diastolic dysfunction). - Right ventricle: The cavity size was mildly dilated. Wall thickness was normal. - Right atrium: The atrium was mildly dilated. - Pulmonary arteries: Systolic pressure was mildly increased.  ECG - SR, LAE, LVH  Cardiac MRI - 04/09/2014  IMPRESSION: 1. Normal left ventricular size, thickness and systolic function (LVEF 64%). No regional wall motion abnormalities.  2. Normal right ventricular size, thickness and systolic function (LVEF 60%). No regional wall motion abnormalities.  3. Normal bi-atrial size. Trace mitral and tricuspid regurgitation.  4. No rest or stress induced perfusion defect. No evidence of scar or ischemia.  5. There is circumferential late gadolinium enhancement of the pericardium consistent with acute pericarditis    Assessment & Plan  1. Pericarditis: Chest Pain:stress MRI negative for prior scar or ischemia, with evidence of pericarditis on gadolinium images. Have d/c ASA/Tylenol and written her for tid naproxen and colchicine.  Since her symptoms  recurred off NSAIDs, the patient is advised to restart taking them, but instead of Naprosyn take coated ibuprofen. She is advised to continue taking colchicine. And also start taking antacids and ranitidine in addition to pantoprazole. She is also advised that she can continue doing physical extremity and diet as until now. She is keeping contact for each chart to [place] to dust while she travels to [place] about any improvement or worsening of her condition. If she is unable to use NSAIDs or her conditions worsen, might consider a short course of steroids. The patient will require long-term colchicine as she states that she has had at least 5 episodes of these in the last 6 years.  Follow-up in one month.   Lars MassonNELSON, Alon Mazor H, MD, [facility] 04/22/2014, 9:49 AM

## 2014-04-24 ENCOUNTER — Ambulatory Visit: Payer: Medicare Other | Admitting: Nurse Practitioner

## 2014-04-24 ENCOUNTER — Telehealth: Payer: Self-pay | Admitting: Cardiology

## 2014-04-24 NOTE — Telephone Encounter (Signed)
New message    Patient calling does she need any pre-med before dental work.

## 2014-04-24 NOTE — Telephone Encounter (Signed)
Pt calling to ask Dr [name] if she needs to go on a prophylactic antibiotic prior to her dental cleaning.  Pt states the dentist office advised her to call her Cardiologist first before setting a date to have her teeth cleaned.  Pt also would like to inform Dr [name] that she is still having her episodes at night time of chest tightness and pressure.  Pt states she saw Dr [name] on Monday 11/16 and is she is aware of what pt is going through.  Informed the pt that I will route this message to her for further review and recommendation on both questions, and follow-up thereafter.  Pt verbalized understanding and agrees with this plan.

## 2014-04-24 NOTE — Telephone Encounter (Signed)
She doesn't need antibiotics due dental cleaning cleaning or any other dental work.

## 2014-04-24 NOTE — Telephone Encounter (Signed)
Pt notified that per Dr Delton SeeNelson she does not need antibiotics due to dental cleaning or any other dental work, and informed the pt to continue on current regimen for the pericarditis, like her colchicine and her ibuprofen.  Informed the pt if she gets no relief from that within the next 2 weeks, to notify our office, to consider Dr Lindaann SloughNelson's recommendations to start a low dose steroid.  Pt verbalized understanding and agrees with this plan.

## 2014-06-10 ENCOUNTER — Ambulatory Visit: Payer: Medicare Other | Admitting: Cardiology

## 2014-06-13 ENCOUNTER — Encounter: Payer: Self-pay | Admitting: Cardiology

## 2015-04-02 ENCOUNTER — Emergency Department (HOSPITAL_COMMUNITY): Payer: Medicare Other

## 2015-04-02 ENCOUNTER — Emergency Department (HOSPITAL_COMMUNITY)
Admission: EM | Admit: 2015-04-02 | Discharge: 2015-04-02 | Disposition: A | Discharge status: Home / Self Care | Payer: Medicare Other | Attending: Emergency Medicine | Admitting: Emergency Medicine

## 2015-04-02 ENCOUNTER — Encounter (HOSPITAL_COMMUNITY): Payer: Self-pay | Admitting: *Deleted

## 2015-04-02 DIAGNOSIS — R072 Precordial pain: Secondary | ICD-10-CM

## 2015-04-02 DIAGNOSIS — Z79899 Other long term (current) drug therapy: Secondary | ICD-10-CM | POA: Diagnosis not present

## 2015-04-02 DIAGNOSIS — I319 Disease of pericardium, unspecified: Secondary | ICD-10-CM | POA: Insufficient documentation

## 2015-04-02 DIAGNOSIS — R079 Chest pain, unspecified: Secondary | ICD-10-CM | POA: Diagnosis present

## 2015-04-02 LAB — BASIC METABOLIC PANEL
Anion gap: 12 (ref 5–15)
BUN: 10 mg/dL (ref 6–20)
CO2: 22 mmol/L (ref 22–32)
Calcium: 9.9 mg/dL (ref 8.9–10.3)
Chloride: 104 mmol/L (ref 101–111)
Creatinine, Ser: 0.8 mg/dL (ref 0.44–1.00)
GFR calc Af Amer: >60 mL/min (ref >60)
GFR calc non Af Amer: >60 mL/min (ref >60)
Glucose, Bld: 91 mg/dL (ref 65–99)
Potassium: 3.6 mmol/L (ref 3.5–5.1)
Sodium: 138 mmol/L (ref 135–145)

## 2015-04-02 LAB — CBC
HCT: 37.1 % (ref 36.0–46.0)
Hemoglobin: 11.4 g/dL — ABNORMAL LOW (ref 12.0–15.0)
MCH: 25.7 pg — ABNORMAL LOW (ref 26.0–34.0)
MCHC: 30.7 g/dL (ref 30.0–36.0)
MCV: 83.6 fL (ref 78.0–100.0)
Platelets: 279 10*3/uL (ref 150–400)
RBC: 4.44 MIL/uL (ref 3.87–5.11)
RDW: 17.5 % — ABNORMAL HIGH (ref 11.5–15.5)
WBC: 3.7 10*3/uL — ABNORMAL LOW (ref 4.0–10.5)

## 2015-04-02 LAB — I-STAT TROPONIN, ED
Troponin i, poc: 0 ng/mL (ref 0.00–0.08)
Troponin i, poc: 0 ng/mL (ref 0.00–0.08)

## 2015-04-02 LAB — SEDIMENTATION RATE: Sed Rate: 56 mm/hr — ABNORMAL HIGH (ref 0–22)

## 2015-04-02 MED ORDER — COLCHICINE 0.6 MG PO TABS: 0.6000 mg | ORAL_TABLET | Freq: Two times a day (BID) | ORAL | Status: DC

## 2015-04-02 MED ORDER — ASPIRIN 81 MG PO CHEW
324.0000 mg | CHEWABLE_TABLET | Freq: Once | ORAL | Status: AC
Start: 2015-04-02 — End: 2015-04-02
  Administered 2015-04-02: 324 mg via ORAL
  Filled 2015-04-02: qty 4

## 2015-04-02 MED ORDER — NAPROXEN 375 MG PO TABS: 375.0000 mg | ORAL_TABLET | Freq: Three times a day (TID) | ORAL | Status: DC

## 2015-04-02 NOTE — Consult Note (Signed)
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 Doing good until Monday  Develoopped CP that was constant  WOrse with inspiration and movement side to side.   Tuesday a little better   Wed today was worse  Came to ER No cough  No F/C            Past Medical History  Diagnosis Date  . Chest pain   . Head pain   . Dizziness      (Not in a hospital admission)     Infusions:   No Known Allergies  Social History   Social History  . Marital Status: Married    Spouse Name: Dennie Bible  . Number of Children: 6  . Years of Education: 7   Occupational History  . Not on file.   Social History Main Topics  . Smoking status: Never Smoker   . Smokeless tobacco: Never Used  . Alcohol Use: No  . Drug Use: No  . Sexual Activity: Not on file   Other Topics Concern  . Not on file   Social History Narrative   Patient is married Conservation officer, historic buildings) and lives at home with her husband and her daughter.   Patient has five living children and one is deceased.   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   Patient is right handed.   Patient drinks very little caffeine.    Family History  Problem Relation Age of Onset  . Coronary artery disease    . Heart disease Father   . Heart failure Father   . Cancer Mother     REVIEW OF SYSTEMS:  All systems reviewed  Negative to the above problem except as noted above.    PHYSICAL EXAM: Filed Vitals:   04/02/15 1800  BP: 113/61  Pulse: 76  Temp:   Resp: 18    No intake or output data in the 24 hours ending 04/02/15 1839  General:  Well appearing. No respiratory difficulty HEENT: normal Neck: supple. no JVD. Carotids 2+ bilat; no bruits. No lymphadenopathy or thryomegaly appreciated. Cor: PMI nondisplaced. Regular rate & rhythm. No rubs, gallops or murmurs. Lungs: clear Abdomen: soft, nontender, nondistended. No  hepatosplenomegaly. No bruits or masses. Good bowel sounds. Extremities: no cyanosis, clubbing, rash, edema Neuro: alert & oriented x 3, cranial nerves grossly intact. moves all 4 extremities w/o difficulty. Affect pleasant.  ECG:  SR 86 bpm  Results for orders placed or performed during the hospital encounter of 04/02/15 (from the past 24 hour(s))  I-stat troponin, ED     Status: None   Collection Time: 04/02/15 12:41 PM  Result Value Ref Range   Troponin i, poc 0.00 0.00 - 0.08 ng/mL   Comment 3          Basic metabolic panel     Status: None   Collection Time: 04/02/15 12:43 PM  Result Value Ref Range   Sodium 138 135 - 145 mmol/L   Potassium 3.6 3.5 - 5.1 mmol/L   Chloride 104 101 - 111 mmol/L   CO2 22 22 - 32 mmol/L   Glucose, Bld 91 65 - 99 mg/dL   BUN 10 6 - 20 mg/dL   Creatinine, Ser 1.61 0.44 - 1.00 mg/dL   Calcium 9.9 8.9 - 09.6 mg/dL   GFR calc non Af Amer >60 >60 mL/min   GFR calc Af Amer >60 >60  mL/min   Anion gap 12 5 - 15  CBC     Status: Abnormal   Collection Time: 04/02/15 12:43 PM  Result Value Ref Range   WBC 3.7 (L) 4.0 - 10.5 K/uL   RBC 4.44 3.87 - 5.11 MIL/uL   Hemoglobin 11.4 (L) 12.0 - 15.0 g/dL   HCT 10.237.1 72.536.0 - 36.646.0 %   MCV 83.6 78.0 - 100.0 fL   MCH 25.7 (L) 26.0 - 34.0 pg   MCHC 30.7 30.0 - 36.0 g/dL   RDW 44.017.5 (H) 34.711.5 - 42.515.5 %   Platelets 279 150 - 400 K/uL  I-stat troponin, ED     Status: None   Collection Time: 04/02/15  4:10 PM  Result Value Ref Range   Troponin i, poc 0.00 0.00 - 0.08 ng/mL   Comment 3           Dg Chest 2 View  04/02/2015  CLINICAL DATA:  69 year old female with acute shortness of breath and chest pain for several days. EXAM: CHEST  2 VIEW COMPARISON:  04/09/2014 and prior chest radiographs dating back to 05/05/2006 FINDINGS: The cardiomediastinal silhouette is unremarkable. Minimal basilar atelectasis/scarring noted. There is no evidence of focal airspace disease, pulmonary edema, suspicious pulmonary nodule/mass,  pleural effusion, or pneumothorax. No acute bony abnormalities are identified. Right shoulder surgical changes noted. IMPRESSION: Minimal bibasilar atelectasis/ scarring without other significant abnormality. Electronically Signed   By: Harmon PierJeffrey  Hu M.D.   On: 04/02/2015 12:58     ASSESSMENT: 69 yo with histyory pericarditis in past  Now with CP similar to what she had before  Portable echo shows no effusion  Normal LV function and regional wall motion.  EKG neg  I would recomm resuming colchicine 0.6 bid and 400 tid Will make sure she has f/u in clinic next week.  Dietrich PatesPaula Ross

## 2015-04-02 NOTE — ED Provider Notes (Signed)
CSN: [id number]     Arrival date & time 04/02/15  1226 History   First MD Initiated Contact with Patient 04/02/15 1501     Chief Complaint  Patient presents with  . Chest Pain     (Consider location/radiation/quality/duration/timing/severity/associated sxs/prior Treatment) HPI Comments: 69 year old female with a history of pericarditis who presents with chest pain. Patient states that 2 days ago, she began having mild, central, nonradiating chest pain that occurred at rest and lasted approximately 3 hours. It was associated with shortness of breath but no nausea/vomiting or diaphoresis. Yesterday she intermittently had the pain but it was mild until last night and this morning when it became more severe. The pain is worse with any bending, movement, or turning motions. She denies any worsening pain laying flat for improvement of pain leaning forward. She states that the only thing that makes it feel better is holding completely still. She states that this pain feels like previous episodes of pericarditis, last episode was one year ago. She denies any fevers, cough/cold symptoms, abdominal pain, or recent illness. No history of blood clots, recent travel, leg pain, or history of cancer.  FH negative for blood clots, father with heart disease in his 1s.  Patient is a 69 y.o. female presenting with chest pain. The history is provided by the patient.  Chest Pain   Past Medical History  Diagnosis Date  . Chest pain   . Head pain   . Dizziness    Past Surgical History  Procedure Laterality Date  . Tubal ligation    . Appendectomy    . Tonsillectomy    . Cervical cone biopsy     Family History  Problem Relation Age of Onset  . Coronary artery disease    . Heart disease Father   . Heart failure Father   . Cancer Mother    Social History  Substance Use Topics  . Smoking status: Never Smoker   . Smokeless tobacco: Never Used  . Alcohol Use: No   OB History    No data available     Review of Systems  Cardiovascular: Positive for chest pain.   10 Systems reviewed and are negative for acute change except as noted in the HPI.    Allergies  Review of patient's allergies indicates no known allergies.  Home Medications   Prior to Admission medications   Medication Sig Start Date End Date Taking? Authorizing Provider  Ascorbic Acid (VITAMIN C) 1000 MG tablet Take 1,000 mg by mouth daily.   Yes Historical Provider, MD  b complex vitamins tablet Take 1 tablet by mouth daily.   Yes Historical Provider, MD  BIOTIN PO Take 1 tablet by mouth daily.   Yes Historical Provider, MD  Capsicum, Cayenne, (CAYENNE PO) Take by mouth daily.   Yes Historical Provider, MD  Cholecalciferol (VITAMIN D-3 PO) Take 1 tablet by mouth daily.    Yes Historical Provider, MD  LECITHIN PO Take 1 tablet by mouth daily.   Yes Historical Provider, MD  MAGNESIUM GLYCINATE PLUS PO Take 800 mg by mouth daily.   Yes Historical Provider, MD  Multiple Vitamins-Minerals (WOMENS BONE HEALTH PO) Take 3 tablets by mouth daily.   Yes Historical Provider, MD  NON FORMULARY Thermo Herbal Tea Drink daily   Yes Historical Provider, MD  colchicine 0.6 MG tablet Take 1 tablet (0.6 mg total) by mouth 2 (two) times daily. 04/02/15   Sharlett Iles, MD  naproxen (NAPROSYN) 375 MG tablet Take 1 tablet (  375 mg total) by mouth 3 (three) times daily with meals. 04/02/15   Wenda Overland Jarel Cuadra, MD   BP 121/70 mmHg  Pulse 78  Temp(Src) 98.8 F (37.1 C) (Oral)  Resp 25  Ht $R'5\' 6"'KK$  (1.676 m)  Wt 175 lb (79.379 kg)  BMI 28.26 kg/m2  SpO2 99% Physical Exam  Constitutional: She is oriented to person, place, and time. She appears well-developed and well-nourished. No distress.  HENT:  Head: Normocephalic and atraumatic.  Moist mucous membranes  Eyes: Conjunctivae are normal. Pupils are equal, round, and reactive to light.  Neck: Neck supple.  Cardiovascular: Normal rate, regular rhythm and normal heart sounds.   Exam reveals no friction rub.   No murmur heard. Pulmonary/Chest: Effort normal and breath sounds normal. She exhibits no tenderness.  Abdominal: Soft. Bowel sounds are normal. She exhibits no distension. There is no tenderness.  Musculoskeletal: She exhibits no edema.  Neurological: She is alert and oriented to person, place, and time.  Fluent speech  Skin: Skin is warm and dry.  Psychiatric: She has a normal mood and affect. Judgment normal.  Nursing note and vitals reviewed.   ED Course  Procedures (including critical care time) Labs Review Labs Reviewed  CBC - Abnormal; Notable for the following:    WBC 3.7 (*)    Hemoglobin 11.4 (*)    MCH 25.7 (*)    RDW 17.5 (*)    All other components within normal limits  BASIC METABOLIC PANEL  SEDIMENTATION RATE  ANTINUCLEAR ANTIBODIES, IFA  I-STAT TROPOININ, ED  Randolm Idol, ED    Imaging Review Dg Chest 2 View  04/02/2015  CLINICAL DATA:  69 year old female with acute shortness of breath and chest pain for several days. EXAM: CHEST  2 VIEW COMPARISON:  04/09/2014 and prior chest radiographs dating back to 05/05/2006 FINDINGS: The cardiomediastinal silhouette is unremarkable. Minimal basilar atelectasis/scarring noted. There is no evidence of focal airspace disease, pulmonary edema, suspicious pulmonary nodule/mass, pleural effusion, or pneumothorax. No acute bony abnormalities are identified. Right shoulder surgical changes noted. IMPRESSION: Minimal bibasilar atelectasis/ scarring without other significant abnormality. Electronically Signed   By: Margarette Canada M.D.   On: 04/02/2015 12:58   I have personally reviewed and evaluated these lab results as part of my medical decision-making.   EKG Interpretation   Date/Time:  Wednesday April 02 2015 12:34:13 EDT Ventricular Rate:  86 PR Interval:  140 QRS Duration: 82 QT Interval:  350 QTC Calculation: 418 R Axis:   3 Text Interpretation:  Normal sinus rhythm Low voltage QRS  Borderline ECG  No significant change since last tracing Confirmed by Derotha Fishbaugh MD, Trevia Nop  [phone]) on 04/02/2015 3:07:08 PM     Medications  aspirin chewable tablet 324 mg (324 mg Oral Given 04/02/15 1542)    MDM   Final diagnoses:  Pericarditis    69 year old female who presents with a few days of central, nonradiating chest pain at rest which feels like previous episodes of pericarditis. Patient well-appearing with normal vital signs at presentation. EKG without ischemic changes and no significant change from previous. No murmur or obvious friction rub on exam. Unable to reproduce pain with palpation. Gave the patient 324 mg aspirin. Chest x-ray were unremarkable and lab work including serial troponins is negative. Spoke with cardiology regarding patient's presentation as her age group increases risk of heart disease but she has no other significant risk factors. No risk factors for PE, no sudden ripping or tearing chest pain and no widened mediastinum to  suggest aortic dissection. Dr. Harrington Challenger evaluated the patient at bedside and I appreciate her assistance with the patient's care. Based on patient's description of pain and history, she suspects her recurrent pericarditis. Per her recommendation, I have ordered ESR and ANA to be followed up in the clinic. Provided patient with colchicine and Naprosyn to start at home. Patient to follow-up in cardiology clinic in one week. Return precautions reviewed and patient discharged in satisfactory condition.    Sharlett Iles, MD 04/02/15 [phone]

## 2015-04-02 NOTE — Discharge Instructions (Signed)
Pericarditis °Pericarditis is swelling (inflammation) of the pericardium. The pericardium is a thin, double-layered, fluid-filled tissue sac that surrounds the heart. The purpose of the pericardium is to contain the heart in the chest cavity and keep the heart from overexpanding. Different types of pericarditis can occur, such as: °· Acute pericarditis. Inflammation can develop suddenly in acute pericarditis. °· Chronic pericarditis. Inflammation develops gradually and is long-lasting in chronic pericarditis. °· Constrictive pericarditis. In this type of pericarditis, the layers of the pericardium stiffen and develop scar tissue. The scar tissue thickens and sticks together. This makes it difficult for the heart to pump and work as it normally does. °CAUSES  °Pericarditis can be caused from different conditions, such as: °· A bacterial, fungal or viral infection. °· After a heart attack (myocardial infarction). °· After open-heart surgery (coronary bypass graft surgery). °· Auto-immune conditions such as lupus, rheumatoid arthritis or scleroderma. °· Kidney failure. °· Low thyroid condition (hypothyroidism). °· Cancer from another part of the body that has spread (metastasized) to the pericardium. °· Chest injury or trauma. °· After radiation treatment. °· Certain medicines. °SYMPTOMS  °Symptoms of pericarditis can include: °· Chest pain. Chest pain symptoms may increase when laying down and may be relieved when sitting up and leaning forward. °· A chronic, dry cough. °· Heart palpitations. These may feel like rapid, fluttering or pounding heart beats. °· Chest pain may be worse when swallowing. °· Dizziness or fainting. °· Tiredness, fatigue or lethargy. °· Fever. °DIAGNOSIS  °Pericarditis is diagnosed by the following: °· A physical exam. A heart sound called a pericardial friction rub may be heard when your caregiver listens to your heart. °· Blood work. Blood may be drawn to check for an infection and to look at  your blood chemistry. °· Electrocardiography. During electrocardiography your heart's electrical activity is monitored and recorded with a tracing on paper (electrocardiogram [ECG]). °· Echocardiography. °· Computed tomography (CT). °· Magnetic resonance image (MRI). °TREATMENT  °To treat pericarditis, it is important to know the cause of it. The cause of pericarditis determines the treatment.  °· If the cause of pericarditis is due to an infection, treatment is based on the type of infection. If an infection is suspected in the pericardial fluid, a procedure called a pericardial fluid culture and biopsy may be done. This takes a sample of the pericardial fluid. The sample is sent to a lab which runs tests on the pericardial fluid to check for an infection. °· If the autoimmune disease is the cause, treatment of the autoimmune condition will help improve the pericarditis. °· If the cause of pericarditis is not known, anti-inflammatory medicines may be used to help decrease the inflammation. °· Surgery may be needed. The following are types of surgeries or procedures that may be done to treat pericarditis: °¨ Pericardial window. A pericardial window makes a cut (incision) into the pericardial sac. This allows excess fluid in the pericardium to drain. °¨ Pericardiocentesis. A pericardiocentesis is also known as a pericardial tap. This procedure uses a needle that is guided by X-ray to drain (aspirate) excess fluid from the pericardium. °¨ Pericardiectomy. A pericardiectomy removes part or all of the pericardium. °HOME CARE INSTRUCTIONS  °· Do not smoke. If you smoke, quit. Your caregiver can help you quit smoking. °· Maintain a healthy weight. °· Follow an exercise program as directed by your health care provider. You may need to limit your exercising until your symptoms go away. °· If you drink alcohol, do so in moderation. °·   Eat a heart healthy diet. A registered dietitian can help you learn about healthy food  choices. °· Keep a list of all your medicines with you at all times. Include the name, dose, how often it is taken and how it is taken. °SEEK IMMEDIATE MEDICAL CARE IF:  °· You have chest pain or feelings of chest pressure. °· You have sweating (diaphoresis) when at rest. °· You have irregular heartbeats (palpitations). °· You have rapid, racing heart beats. °· You have unexplained fainting episodes. °· You feel sick to your stomach (nausea) or vomiting without cause. °· You have unexplained weakness. °If you develop any of the symptoms which originally made you seek care, call for local emergency medical help. Do not drive yourself to the hospital. °  °This information is not intended to replace advice given to you by your health care provider. Make sure you discuss any questions you have with your health care provider. °  °Document Released: 11/17/2000 Document Revised: 10/08/2014 Document Reviewed: 12/04/2014 °Elsevier Interactive Patient Education ©2016 [employer] ° °

## 2015-04-02 NOTE — ED Notes (Signed)
Pt reports onset of mid chest heaviness Monday night, it improved yesterday but became worse this am. Reports sob, denies nausea. No acute distress noted at triage, ekg done.

## 2015-04-03 LAB — ANTINUCLEAR ANTIBODIES, IFA: ANA Ab, IFA: NEGATIVE

## 2015-04-07 ENCOUNTER — Encounter: Payer: Self-pay | Admitting: Cardiology

## 2015-04-07 ENCOUNTER — Ambulatory Visit (INDEPENDENT_AMBULATORY_CARE_PROVIDER_SITE_OTHER): Payer: Medicare Other | Admitting: Cardiology

## 2015-04-07 VITALS — BP 126/74 | HR 61 | Ht 66.0 in | Wt 184.0 lb

## 2015-04-07 DIAGNOSIS — I309 Acute pericarditis, unspecified: Secondary | ICD-10-CM | POA: Diagnosis not present

## 2015-04-07 NOTE — Progress Notes (Signed)
Patient ID: [name], female   DOB: Jul 06, 1945, 69 y.o.   MRN: [id number]    Patient Name: [name] Date of Encounter: 04/07/2015  Primary Care Provider:  No PCP Per Patient Primary Cardiologist:  Lars Masson   Problem List   Past Medical History  Diagnosis Date  . Chest pain   . Head pain   . Dizziness    Past Surgical History  Procedure Laterality Date  . Tubal ligation    . Appendectomy    . Tonsillectomy    . Cervical cone biopsy      Allergies  No Known Allergies  HPI  [name] is a 69 y.o. female with NO SIGNIFICANT Past medical history. The patient presented with complaints of chest pain. She mentions that when she woke up this morning she was asymptomatic and then she went to the church and when she came back she was not feeling good and later on in the afternoon she started having substernal chest pain which was sharp and pressure-like associated with shortness of breath and worsening with lying down. She also had some chills but denies any fever or cough or runny nose. She denies any similar episodes in recent past but did have one episode a year ago. She also had a stress test a few years ago which was negative Hospital Course:  1. Chest pain - negative troponin x 3, negative D dimer, ECHO not suggestive of pericarditis, however clinical presentation and rub on physical exam consistent with the diagnosis of an acute pericarditis. Cardiology has ordered stress MRI which showed circumferential late gadolinium enhancement of the pericardium consistent with acute pericarditis. -Followed by Cardiology and recommended to start Naproxen  TID and Colchicine BID and FU with DR.Delton See. Started on PPI.  04/22/2014 - the patient is coming for follow-up visit 10 days after hospital discharge. She states that initially her symptoms improved however she stop using NSAIDs due to significant abdominal pain and her chest pain free cured especially when  she is reclining. She has several questions regarding her condition, prognosis, physical activity diet and additional treatment.  04/07/2015 - the patient was doing well until the next week when she developed significant pleuritic type of chest pain again, went to the ER, normal troponin, unchanged ECG, started on naprosyn and colchicine, improved symptoms, but remaining chest discomfort, mostly on position changes and deep inspiration.   Home Medications  Prior to Admission medications   Medication Sig Start Date End Date Taking? Authorizing Provider  Ascorbic Acid (VITAMIN C) 1000 MG tablet Take 1,000 mg by mouth daily.   Yes Historical Provider, MD  BIOTIN PO Take 1 tablet by mouth daily.   Yes Historical Provider, MD  Capsicum, Cayenne, (CAYENNE PO) Take by mouth daily.   Yes Historical Provider, MD  Cholecalciferol (VITAMIN D-3 PO) Take 1 tablet by mouth daily.    Yes Historical Provider, MD  colchicine 0.6 MG tablet Take 1 tablet (0.6 mg total) by mouth 2 (two) times daily. 04/10/14  Yes Zannie Cove, MD  cyanocobalamin 1000 MCG tablet Take 100 mcg by mouth daily.   Yes Historical Provider, MD  LECITHIN PO Take 1 tablet by mouth daily.   Yes Historical Provider, MD  MAGNESIUM GLYCINATE PLUS PO Take 800 mg by mouth daily.   Yes Historical Provider, MD  Multiple Vitamins-Minerals (WOMENS BONE HEALTH PO) Take 3 tablets by mouth daily.   Yes Historical Provider, MD  naproxen (NAPROSYN) 375 MG tablet Take 1 tablet (375 mg total)  by mouth 3 (three) times daily with meals. 04/10/14  Yes Zannie CovePreetha Joseph, MD  NON FORMULARY Thermo Herbal Tea Drink daily   Yes Historical Provider, MD  Nutritional Supplements (WOMENS FORMULA MENOPAUSE PO) Take 1 tablet by mouth daily.    Yes Historical Provider, MD  pantoprazole (PROTONIX) 40 MG tablet Take 1 tablet (40 mg total) by mouth daily. 04/10/14  Yes Zannie CovePreetha Joseph, MD    Family History  Family History  Problem Relation Age of Onset  . Coronary artery  disease    . Heart disease Father   . Heart failure Father   . Cancer Mother     Social History  Social History   Social History  . Marital Status: Married    Spouse Name: Dennie Biblethelstan  . Number of Children: 6  . Years of Education: [id number]   Occupational History  . Not on file.   Social History Main Topics  . Smoking status: Never Smoker   . Smokeless tobacco: Never Used  . Alcohol Use: No  . Drug Use: No  . Sexual Activity: Not on file   Other Topics Concern  . Not on file   Social History Narrative   Patient is married Conservation officer, historic buildings(Athelstan) and lives at home with her husband and her daughter.   Patient has five living children and one is deceased.   Patient is a retired Runner, broadcasting/film/videoteacher.   Patient has a Scientist, water qualityMasters.   Patient is right handed.   Patient drinks very little caffeine.     Review of Systems, as per HPI, otherwise negative General:  No chills, fever, night sweats or weight changes.  Cardiovascular:  No chest pain, dyspnea on exertion, edema, orthopnea, palpitations, paroxysmal nocturnal dyspnea. Dermatological: No rash, lesions/masses Respiratory: No cough, dyspnea Urologic: No hematuria, dysuria Abdominal:   No nausea, vomiting, diarrhea, bright red blood per rectum, melena, or hematemesis Neurologic:  No visual changes, wkns, changes in mental status. All other systems reviewed and are otherwise negative except as noted above.  Physical Exam  Blood pressure 126/74, pulse 61, height 5\' 6"  (1.676 m), weight 184 lb (83.462 kg).  General: Pleasant, NAD Psych: Normal affect. Neuro: Alert and oriented X 3. Moves all extremities spontaneously. HEENT: Normal  Neck: Supple without bruits or JVD. Lungs:  Resp regular and unlabored, CTA. Heart: RRR no s3, s4, or murmurs. Abdomen: Soft, non-tender, non-distended, BS + x 4.  Extremities: No clubbing, cyanosis or edema. DP/PT/Radials 2+ and equal bilaterally.  Labs:  No results for input(s): CKTOTAL, CKMB, TROPONINI in the last 72  hours. Lab Results  Component Value Date   WBC 3.7* 04/02/2015   HGB 11.4* 04/02/2015   HCT 37.1 04/02/2015   MCV 83.6 04/02/2015   PLT 279 04/02/2015    Lab Results  Component Value Date   DDIMER 0.42 04/09/2014   Invalid input(s): POCBNP    Component Value Date/Time   NA 138 04/02/2015 1243   K 3.6 04/02/2015 1243   CL 104 04/02/2015 1243   CO2 22 04/02/2015 1243   GLUCOSE 91 04/02/2015 1243   BUN 10 04/02/2015 1243   CREATININE 0.80 04/02/2015 1243   CALCIUM 9.9 04/02/2015 1243   PROT 6.9 04/09/2014 0835   ALBUMIN 3.4* 04/09/2014 0835   AST 21 04/09/2014 0835   ALT 11 04/09/2014 0835   ALKPHOS 69 04/09/2014 0835   BILITOT 0.4 04/09/2014 0835   GFRNONAA >60 04/02/2015 1243   GFRAA >60 04/02/2015 1243   Lab Results  Component Value Date   CHOL  124 04/09/2014   HDL 62 04/09/2014   LDLCALC 56 04/09/2014   TRIG 32 04/09/2014    Accessory Clinical Findings  Echocardiogram - 04/09/2014 Left ventricle: The cavity size was normal. Wall thickness was normal. Systolic function was vigorous. The estimated ejection fraction was in the range of 65% to 70%. Wall motion was normal; there were no regional wall motion abnormalities. Doppler parameters are consistent with abnormal left ventricular relaxation (grade 1 diastolic dysfunction). - Right ventricle: The cavity size was mildly dilated. Wall thickness was normal. - Right atrium: The atrium was mildly dilated. - Pulmonary arteries: Systolic pressure was mildly increased.  ECG - SR, LAE, LVH  Cardiac MRI - 04/09/2014  IMPRESSION: 1. Normal left ventricular size, thickness and systolic function (LVEF 64%). No regional wall motion abnormalities.  2. Normal right ventricular size, thickness and systolic function (LVEF 60%). No regional wall motion abnormalities.  3. Normal bi-atrial size. Trace mitral and tricuspid regurgitation.  4. No rest or stress induced perfusion defect. No evidence of  scar or ischemia.  5. There is circumferential late gadolinium enhancement of the pericardium consistent with acute pericarditis    Assessment & Plan  1. Recurrent Pericarditis: Chest Pain:in 2015 stress MRI negative for prior scar or ischemia, with evidence of pericarditis on gadolinium images. Have d/c ASA/Tylenol and written her for tid naproxen and colchicine.  Since her symptoms recurred off NSAIDs, the patient is advised to restart taking them, but instead of Naprosyn take coated ibuprofen. She is advised to continue taking colchicine. And also start taking antacids and ranitidine in addition to pantoprazole. She is also advised that she can continue doing physical extremity and diet as until now. She is keeping contact for each chart to [place] to dust while she travels to [place] about any improvement or worsening of her condition. If she is unable to use NSAIDs or her conditions worsen, might consider a short course of steroids.  The patient will require long-term colchicine as she states that she has had at least 5 episodes of these in the last 6 years.  We will recommend naprosyn for another 2 weeks, colchicine long term, ECG low voltage, order echo for possible constrive pericarditis.  If no improvement consider short course of steroids.   Follow-up in one month.   Lars Masson, MD, [facility] 04/07/2015, 8:34 AM

## 2015-04-07 NOTE — Patient Instructions (Signed)
Medication Instructions:   CONTINUE TAKING COLCHICINE FOR 3 MORE MONTHS AND DR Delton SeeNELSON WILL DETERMINE CONTINUATION  CONTINUE TAKING NAPROXEN FOR 2 MORE WEEKS  THE STOP    If you need a refill on your cardiac medications before your next appointment, please call your pharmacy.  Labwork: NONE ORDER TODAY   Testing/Procedures: Your physician has requested that you have an echocardiogram. Echocardiography is a painless test that uses sound waves to create images of your heart. It provides your doctor with information about the size and shape of your heart and how well your heart's chambers and valves are working. This procedure takes approximately one hour. There are no restrictions for this procedure.     Follow-Up: IN ONE MONTH NEXT AVAILBLE  WITH NELSON PER NELSON    Any Other Special Instructions Will Be Listed Below (If Applicable).

## 2015-04-08 DIAGNOSIS — I319 Disease of pericardium, unspecified: Secondary | ICD-10-CM

## 2015-04-08 HISTORY — DX: Disease of pericardium, unspecified: I31.9

## 2015-04-08 IMAGING — CT CT HEAD W/O CM
2 series · 16 of 30 positions shown, 18 images · non-contrast
Comparison: CT brain scan of 11/14/2007

CLINICAL DATA: Fell on 03/24/2013 with persistent headaches,
follow-up, some blurred vision yesterday

EXAM:
CT HEAD WITHOUT CONTRAST
TECHNIQUE: Contiguous axial images were obtained from the base of the skull
through the vertex without intravenous contrast.

[Series 2: head w/o · axial · non-contrast · 0.49mm/px · z∈[+35,+148]mm · 8 of 28 slices shown, 10 images]
[im 4/28  brain]
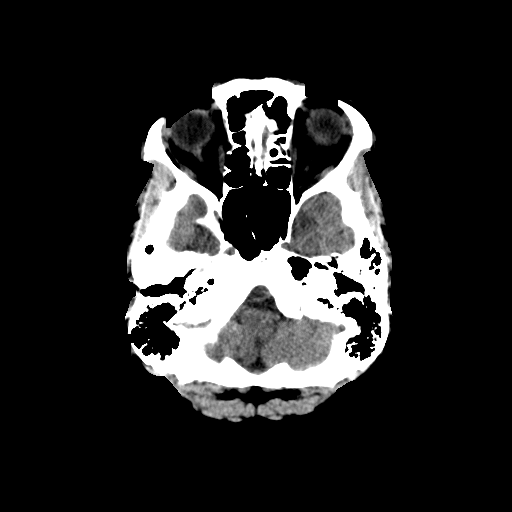
[im 4/28  bone]
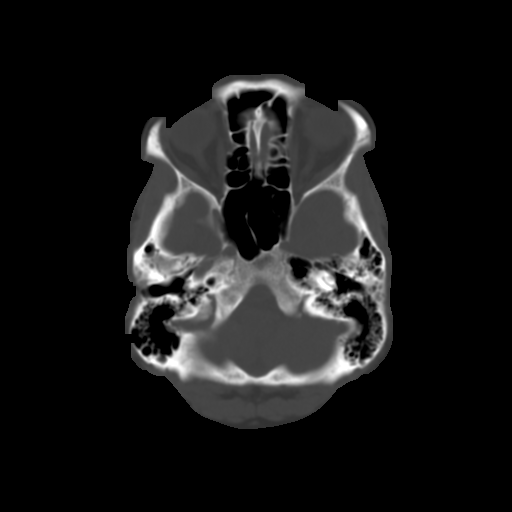
[im 7/28  brain]
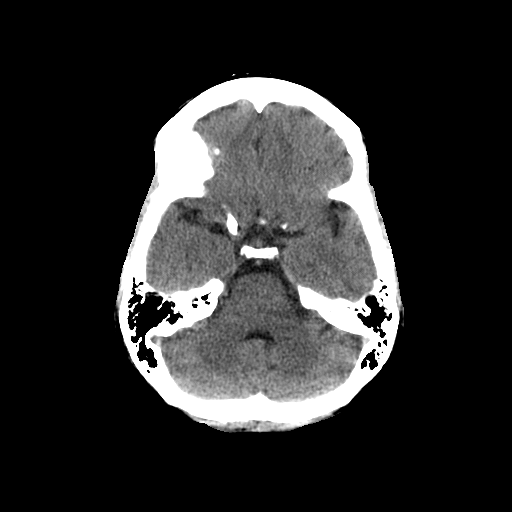
[im 10/28  brain]
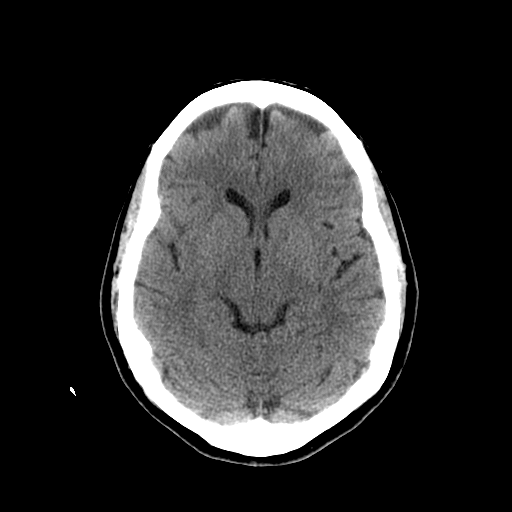
[im 13/28  brain]
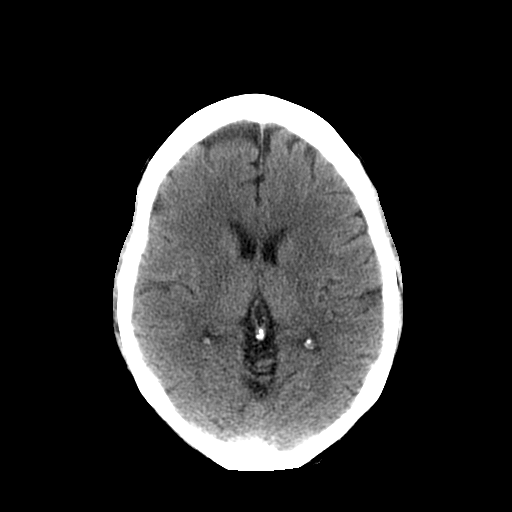
[im 16/28  brain]
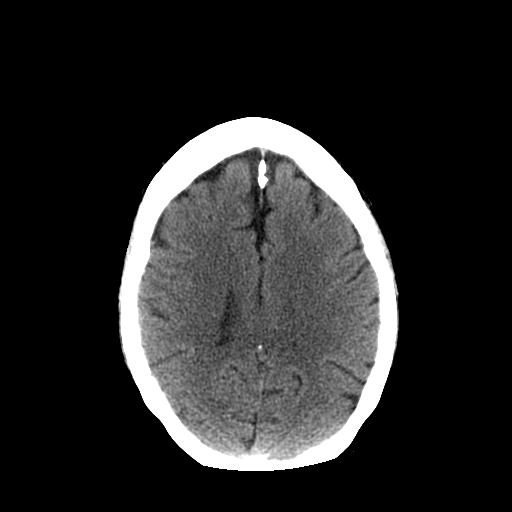
[im 16/28  bone]
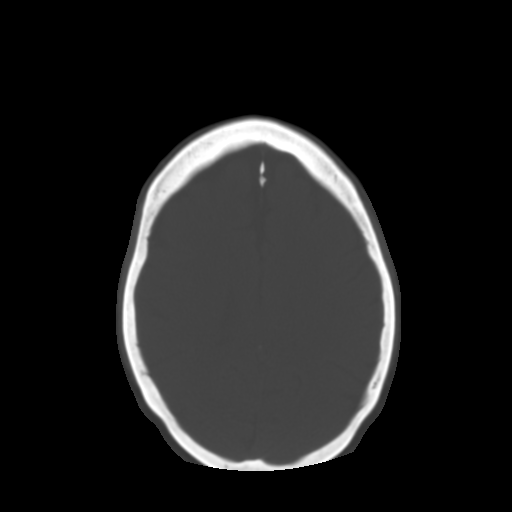
[im 19/28  brain]
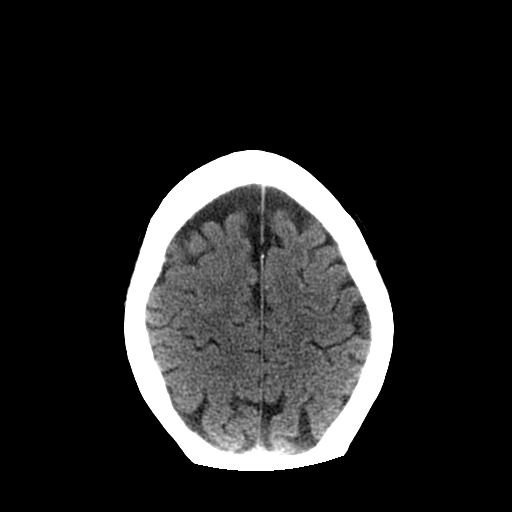
[im 22/28  brain]
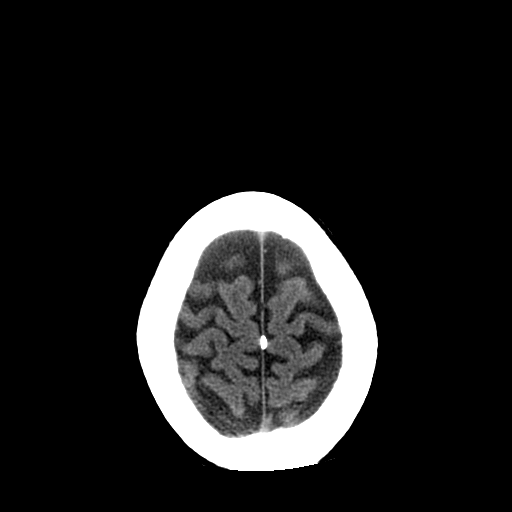
[im 25/28  brain]
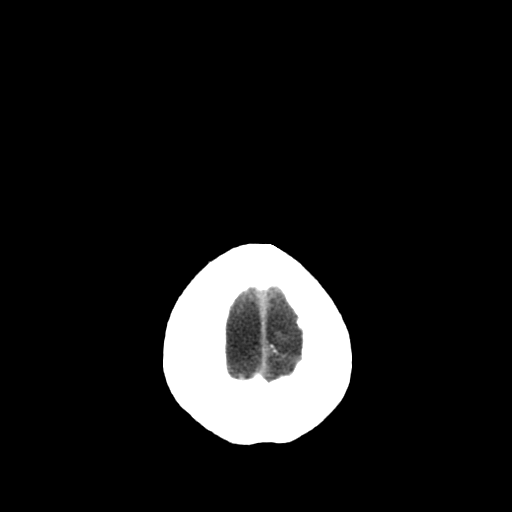

[Series 3: head bone · axial · 0.49mm/px · z∈[+31,+149]mm · 8 of 56 slices shown]
[im 6/56  bone]
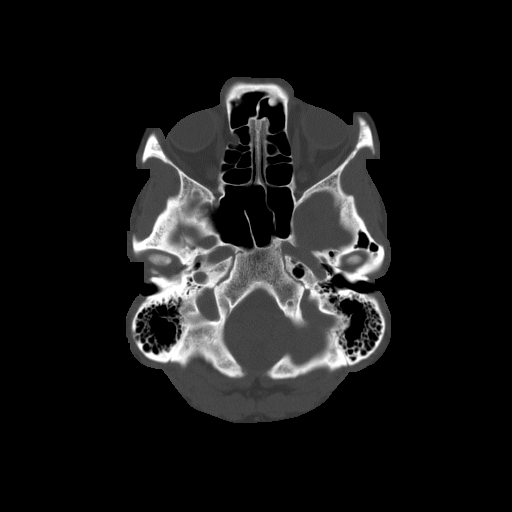
[im 12/56  bone]
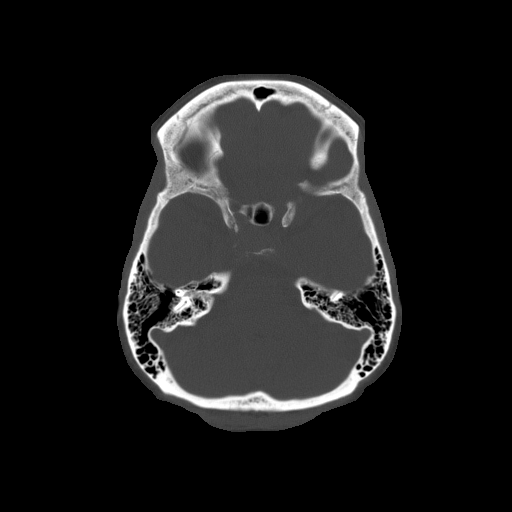
[im 18/56  bone]
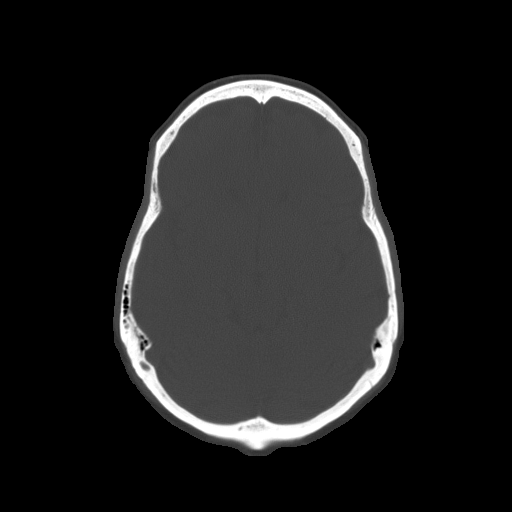
[im 24/56  bone]
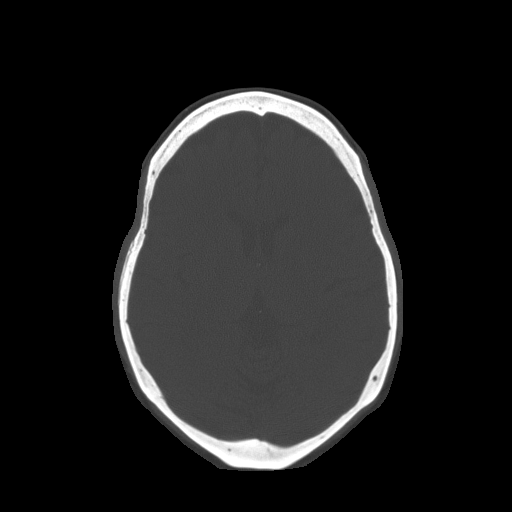
[im 32/56  bone]
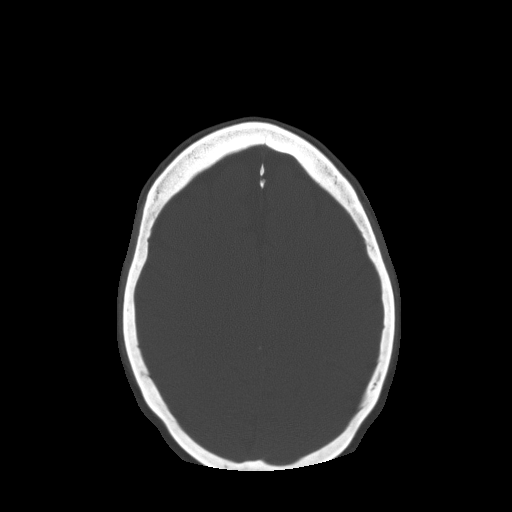
[im 38/56  bone]
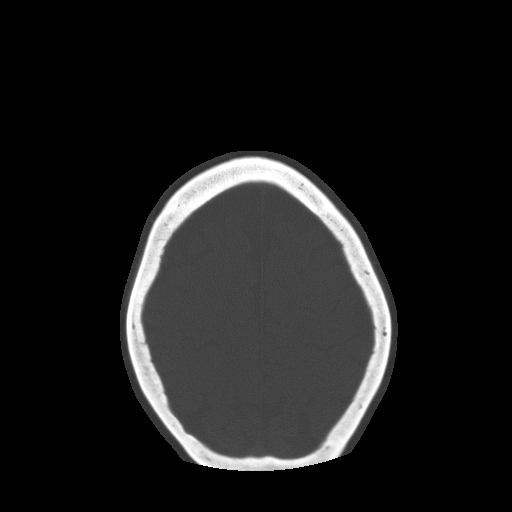
[im 44/56  bone]
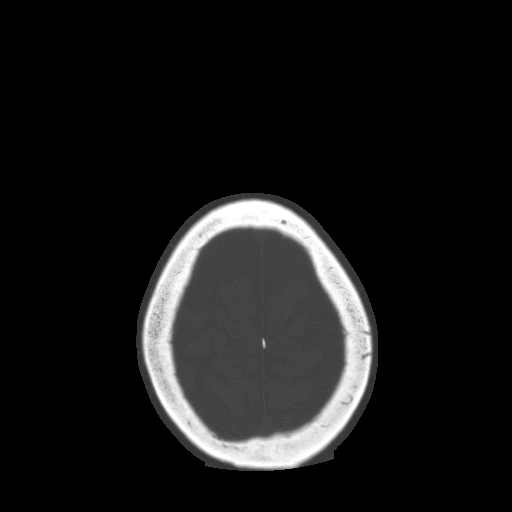
[im 50/56  bone]
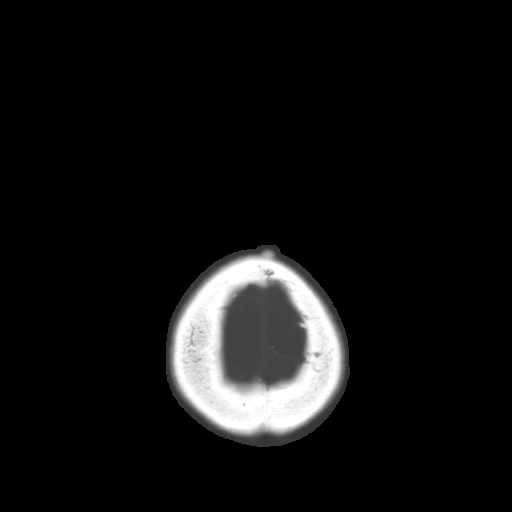

[16 of 30 positions shown; findings below may reference images not displayed]

FINDINGS: The ventricular system is stable in size and configuration, and the
septum is in a normal midline position. The 4th ventricle and
basilar cisterns appear normal. Very minimal prominence of the
cortical sulci is noted consistent with mild atrophy. No hemorrhage,
mass lesion, or acute infarction is seen. On bone window images, no
calvarial abnormality is seen.
IMPRESSION: Negative unenhanced CT of the brain. Minimal cortical atrophy is
noted.

## 2015-04-10 ENCOUNTER — Other Ambulatory Visit: Payer: Self-pay

## 2015-04-10 DIAGNOSIS — Z1231 Encounter for screening mammogram for malignant neoplasm of breast: Secondary | ICD-10-CM

## 2015-04-16 ENCOUNTER — Ambulatory Visit (HOSPITAL_COMMUNITY): Payer: Medicare Other | Attending: Cardiology

## 2015-04-16 ENCOUNTER — Other Ambulatory Visit: Payer: Self-pay

## 2015-04-16 DIAGNOSIS — I313 Pericardial effusion (noninflammatory): Secondary | ICD-10-CM | POA: Diagnosis not present

## 2015-04-16 DIAGNOSIS — I309 Acute pericarditis, unspecified: Secondary | ICD-10-CM

## 2015-05-15 ENCOUNTER — Ambulatory Visit
Admission: RE | Admit: 2015-05-15 | Discharge: 2015-05-15 | Disposition: A | Discharge status: Home / Self Care | Payer: Medicare Other | Source: Ambulatory Visit

## 2015-05-15 DIAGNOSIS — Z1231 Encounter for screening mammogram for malignant neoplasm of breast: Secondary | ICD-10-CM

## 2015-05-22 ENCOUNTER — Ambulatory Visit: Payer: Medicare Other | Admitting: Cardiology

## 2015-07-09 ENCOUNTER — Ambulatory Visit (INDEPENDENT_AMBULATORY_CARE_PROVIDER_SITE_OTHER): Payer: Medicare Other | Admitting: Cardiology

## 2015-07-09 ENCOUNTER — Encounter: Payer: Self-pay | Admitting: Cardiology

## 2015-07-09 ENCOUNTER — Encounter (INDEPENDENT_AMBULATORY_CARE_PROVIDER_SITE_OTHER): Payer: Self-pay

## 2015-07-09 VITALS — BP 130/82 | HR 76 | Ht 66.0 in | Wt 177.0 lb

## 2015-07-09 DIAGNOSIS — I309 Acute pericarditis, unspecified: Secondary | ICD-10-CM | POA: Diagnosis not present

## 2015-07-09 NOTE — Patient Instructions (Signed)
Your physician recommends that you continue on your current medications as directed. Please refer to the Current Medication list given to you today.   Your physician wants you to follow-up in: ONE YEAR WITH DR NELSON You will receive a reminder letter in the mail two months in advance. If you don't receive a letter, please call our office to schedule the follow-up appointment.  

## 2015-07-09 NOTE — Progress Notes (Signed)
Patient ID: [name], female   DOB: August 18, 1945, 70 y.o.   MRN: [id number] Patient ID: [name], female   DOB: January 31, 1946, 70 y.o.   MRN: [id number]    Patient Name: [name] Date of Encounter: 07/09/2015  Primary Care Provider:  No PCP Per Patient Primary Cardiologist:  Lars Masson   Problem List   Past Medical History  Diagnosis Date  . Chest pain   . Head pain   . Dizziness    Past Surgical History  Procedure Laterality Date  . Tubal ligation    . Appendectomy    . Tonsillectomy    . Cervical cone biopsy      Allergies  No Known Allergies  HPI  [name] is a 70 y.o. female with NO SIGNIFICANT Past medical history. The patient presented with complaints of chest pain. She mentions that when she woke up this morning she was asymptomatic and then she went to the church and when she came back she was not feeling good and later on in the afternoon she started having substernal chest pain which was sharp and pressure-like associated with shortness of breath and worsening with lying down. She also had some chills but denies any fever or cough or runny nose. She denies any similar episodes in recent past but did have one episode a year ago. She also had a stress test a few years ago which was negative Hospital Course:  1. Chest pain - negative troponin x 3, negative D dimer, ECHO not suggestive of pericarditis, however clinical presentation and rub on physical exam consistent with the diagnosis of an acute pericarditis. Cardiology has ordered stress MRI which showed circumferential late gadolinium enhancement of the pericardium consistent with acute pericarditis. -Followed by Cardiology and recommended to start Naproxen 375mg  TID and Colchicine BID and FU with DR.Delton See. Started on PPI.  04/22/2014 - the patient is coming for follow-up visit 10 days after hospital discharge. She states that initially her symptoms improved however she stop using NSAIDs  due to significant abdominal pain and her chest pain free cured especially when she is reclining. She has several questions regarding her condition, prognosis, physical activity diet and additional treatment.  04/07/2015 - the patient was doing well until the next week when she developed significant pleuritic type of chest pain again, went to the ER, normal troponin, unchanged ECG, started on naprosyn and colchicine, improved symptoms, but remaining chest discomfort, mostly on position changes and deep inspiration.  07/09/2015 - the patient is coming after 3 months, she looks very happy and syncopal for resolution of her symptoms. She is completely asymptomatic. She completed 3 months course of Colchicine. She underwent echocardiography that showed normal systolic function grade 1 diastolic dysfunction otherwise normal echocardiogram trivial pericardial effusion. She eats very healthy is a vegetarian, she exercises and her cholesterol in November 2015 was completely normal. Her blood pressure is controlled.   Home Medications  Prior to Admission medications   Medication Sig Start Date End Date Taking? Authorizing Provider  Ascorbic Acid (VITAMIN C) 1000 MG tablet Take 1,000 mg by mouth daily.   Yes Historical Provider, MD  BIOTIN PO Take 1 tablet by mouth daily.   Yes Historical Provider, MD  Capsicum, Cayenne, (CAYENNE PO) Take by mouth daily.   Yes Historical Provider, MD  Cholecalciferol (VITAMIN D-3 PO) Take 1 tablet by mouth daily.    Yes Historical Provider, MD  colchicine 0.6 MG tablet Take 1 tablet (0.6 mg total) by mouth 2 (two)  times daily. 04/10/14  Yes Zannie Cove, MD  cyanocobalamin 1000 MCG tablet Take 100 mcg by mouth daily.   Yes Historical Provider, MD  LECITHIN PO Take 1 tablet by mouth daily.   Yes Historical Provider, MD  MAGNESIUM GLYCINATE PLUS PO Take 800 mg by mouth daily.   Yes Historical Provider, MD  Multiple Vitamins-Minerals (WOMENS BONE HEALTH PO) Take 3 tablets by  mouth daily.   Yes Historical Provider, MD  naproxen (NAPROSYN) 375 MG tablet Take 1 tablet (375 mg total) by mouth 3 (three) times daily with meals. 04/10/14  Yes Zannie Cove, MD  NON FORMULARY Thermo Herbal Tea Drink daily   Yes Historical Provider, MD  Nutritional Supplements (WOMENS FORMULA MENOPAUSE PO) Take 1 tablet by mouth daily.    Yes Historical Provider, MD  pantoprazole (PROTONIX) 40 MG tablet Take 1 tablet (40 mg total) by mouth daily. 04/10/14  Yes Zannie Cove, MD    Family History  Family History  Problem Relation Age of Onset  . Coronary artery disease    . Heart disease Father   . Heart failure Father   . Cancer Mother     Social History  Social History   Social History  . Marital Status: Married    Spouse Name: Dennie Bible  . Number of Children: 6  . Years of Education: 84   Occupational History  . Not on file.   Social History Main Topics  . Smoking status: Never Smoker   . Smokeless tobacco: Never Used  . Alcohol Use: No  . Drug Use: No  . Sexual Activity: Not on file   Other Topics Concern  . Not on file   Social History Narrative   Patient is married Conservation officer, historic buildings) and lives at home with her husband and her daughter.   Patient has five living children and one is deceased.   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   Patient is right handed.   Patient drinks very little caffeine.     Review of Systems, as per HPI, otherwise negative General:  No chills, fever, night sweats or weight changes.  Cardiovascular:  No chest pain, dyspnea on exertion, edema, orthopnea, palpitations, paroxysmal nocturnal dyspnea. Dermatological: No rash, lesions/masses Respiratory: No cough, dyspnea Urologic: No hematuria, dysuria Abdominal:   No nausea, vomiting, diarrhea, bright red blood per rectum, melena, or hematemesis Neurologic:  No visual changes, wkns, changes in mental status. All other systems reviewed and are otherwise negative except as  noted above.  Physical Exam  Blood pressure 130/82, pulse 76, height  (1.676 m), weight 177 lb (80.287 kg).  General: Pleasant, NAD Psych: Normal affect. Neuro: Alert and oriented X 3. Moves all extremities spontaneously. HEENT: Normal  Neck: Supple without bruits or JVD. Lungs:  Resp regular and unlabored, CTA. Heart: RRR no s3, s4, or murmurs. Abdomen: Soft, non-tender, non-distended, BS + x 4.  Extremities: No clubbing, cyanosis or edema. DP/PT/Radials 2+ and equal bilaterally.  Labs:  No results for input(s): CKTOTAL, CKMB, TROPONINI in the last 72 hours. Lab Results  Component Value Date   WBC 3.7* 04/02/2015   HGB 11.4* 04/02/2015   HCT 37.1 04/02/2015   MCV 83.6 04/02/2015   PLT 279 04/02/2015    Lab Results  Component Value Date   DDIMER 0.42 04/09/2014   Invalid input(s): POCBNP    Component Value Date/Time   NA 138 04/02/2015 1243   K 3.6 04/02/2015 1243   CL 104 04/02/2015 1243  CO2 22 04/02/2015 1243   GLUCOSE 91 04/02/2015 1243   BUN 10 04/02/2015 1243   CREATININE 0.80 04/02/2015 1243   CALCIUM 9.9 04/02/2015 1243   PROT 6.9 04/09/2014 0835   ALBUMIN 3.4* 04/09/2014 0835   AST 21 04/09/2014 0835   ALT 11 04/09/2014 0835   ALKPHOS 69 04/09/2014 0835   BILITOT 0.4 04/09/2014 0835   GFRNONAA >60 04/02/2015 1243   GFRAA >60 04/02/2015 1243   Lab Results  Component Value Date   CHOL 124 04/09/2014   HDL 62 04/09/2014   LDLCALC 56 04/09/2014   TRIG 32 04/09/2014    Accessory Clinical Findings  Echocardiogram - 04/09/2014 Left ventricle: The cavity size was normal. Wall thickness was normal. Systolic function was vigorous. The estimated ejection fraction was in the range of 65% to 70%. Wall motion was normal; there were no regional wall motion abnormalities. Doppler parameters are consistent with abnormal left ventricular relaxation (grade 1 diastolic dysfunction). - Right ventricle: The cavity size was mildly dilated.  Wall thickness was normal. - Right atrium: The atrium was mildly dilated. - Pulmonary arteries: Systolic pressure was mildly increased.  ECG - SR, LAE, LVH  Cardiac MRI - 04/09/2014 1. Normal left ventricular size, thickness and systolic function (LVEF 64%). No regional wall motion abnormalities.  2. Normal right ventricular size, thickness and systolic function (LVEF 60%). No regional wall motion abnormalities.  3. Normal bi-atrial size. Trace mitral and tricuspid regurgitation.  4. No rest or stress induced perfusion defect. No evidence of scar or ischemia.  5. There is circumferential late gadolinium enhancement of the pericardium consistent with acute pericarditis  TTE: 04/16/2015 - Left ventricle: The cavity size was normal. Wall thickness was normal. Systolic function was normal. The estimated ejection fraction was in the range of 60% to 65%. Wall motion was normal; there were no regional wall motion abnormalities. Doppler parameters are consistent with abnormal left ventricular relaxation (grade 1 diastolic dysfunction). - Aortic valve: There was no stenosis. - Mitral valve: There was no significant regurgitation. - Right ventricle: The cavity size was normal. Systolic function was normal. - Tricuspid valve: Peak RV-RA gradient (S): 20 mm Hg. - Pulmonary arteries: PA peak pressure: 23 mm Hg (S). - Inferior vena cava: The vessel was normal in size. The respirophasic diameter changes were in the normal range (>= 50%), consistent with normal central venous pressure. - Pericardium, extracardiac: A trivial pericardial effusion was identified.  Impressions: - Normal LV size with EF 60-65%. Normal RV size and systolic function. No significant valvular abnormalities. Trivial pericardial effusion.    Assessment & Plan  1. Recurrent Pericarditis: Chest Pain:in 2015 stress MRI negative for prior scar or ischemia, with evidence of pericarditis  on gadolinium images. Have d/c ASA/Tylenol and written her for tid naproxen and colchicine.  She completely recovered from another episode of recurrent acute pericarditis diagnosed in October 2016. She is now off both NSAIDs and Colchicine.  2. Preventive cardiology - patient's cholesterol all at goal in 2015 she is very healthy eating vegetarian diet and exercising. Her blood pressure control as well no labs needed at this time.  Follow-up in one year.  Lars Masson, MD, [facility] 07/09/2015, 9:30 AM

## 2015-07-25 DIAGNOSIS — R519 Headache, unspecified: Secondary | ICD-10-CM

## 2015-07-25 HISTORY — DX: Headache, unspecified: R51.9

## 2015-07-30 ENCOUNTER — Telehealth: Payer: Self-pay | Admitting: Neurology

## 2015-07-30 ENCOUNTER — Other Ambulatory Visit: Payer: Self-pay | Admitting: Obstetrics and Gynecology

## 2015-07-30 NOTE — Telephone Encounter (Addendum)
Spoke to patient - she would like to be seen for a return of intermittent head pain.  Appt scheduled for her on 08/04/15 in an open slot.  Patient agreeable to the time and date. She was advised to see PCP for any concerns prior to her appt.

## 2015-07-30 NOTE — Telephone Encounter (Signed)
Patient is calling. She last saw Dr. Terrace Arabia in 2015 for head issues but did not return because she states she felt better. Last Friday she banged her head on a glass door. She felt pain but it went away after a couple of days. Last night the pain started again. I was going to schedule her with Eber Jones but the nurse states she should see Dr. Terrace Arabia since she injured her head. The patient thinks she needs to be seen soon. Please call and advise. Thank you.

## 2015-07-31 ENCOUNTER — Ambulatory Visit: Payer: Self-pay | Admitting: Nurse Practitioner

## 2015-08-04 ENCOUNTER — Ambulatory Visit (INDEPENDENT_AMBULATORY_CARE_PROVIDER_SITE_OTHER): Payer: Medicare Other | Admitting: Neurology

## 2015-08-04 ENCOUNTER — Encounter: Payer: Self-pay | Admitting: Neurology

## 2015-08-04 VITALS — BP 118/76 | HR 84 | Ht 66.0 in | Wt 178.0 lb

## 2015-08-04 DIAGNOSIS — G44209 Tension-type headache, unspecified, not intractable: Secondary | ICD-10-CM | POA: Diagnosis not present

## 2015-08-04 NOTE — Progress Notes (Signed)
Chief Complaint  Patient presents with  . Headache    She hit her head on a glass door on 07/25/15 and developed a headache that resolved after a couple of days.  She did not lose consciousness.  Now, she has noticed an increased frequency of headaches and is concerned because of her past history of concusssion.    [facility]  PATIENT: [name] DOB: 09/08/1945  HISTORICAL  [name] is a 70 years old right-handed African American female, accompanied by her husband, referred by her primary care physician Dr. Andi Devon for evaluation of left-sided headaches in March 2015.  She denies a previous history of migraine headaches, was very healthy, in October 18th 2014, at the hotel, close to midnight, she was trying to sit down at a rolling chair in front of the mirror but she mis-positioned herself, fell backwards on the floor, landed on her occipital region at the bathtub, there was no loss of consciousness, but she developed occipital area headaches afterwards, rest of the night, she was afraid of going to the sleep,  She was fine for 3 weeks, until early November 2014, she began to develop frequent, almost daily headaches, left parietal, left frontal, left retro-orbital region, 5 out of 7 days, the pain was moderate to severe, debilitating for her, lasting for a few hours, she has to take multiple doses of ibuprofen, she presented to the emergency room, CAT scan of the brain was normal, MRI of the brain was normal,  She also complains of mild blurry vision, no gait difficulty,  She is very hesitate about the potential side effect of the medication, she does not want to go on any preventive medications  UPDATE March 3rd 2015: She no longer has headaches, has been doing very well until 3 weeks ago, she woke up in the middle of the night using bathroom, felt sudden onset vertigo, gait difficulty, she was able to go back to bed, when she woke up next morning, she  continued to felt vertigo, dizziness, unsteady gait, ever since the event, with sudden movement, she felt unsteadiness, no hearing loss, no tinnitus, no double vision,  UPDATE Aug 04 2015: Last clinical visit was in March 2015, she has been doing well until July 25 2015, she bumped forcefully into a glass door, no loss of consciousness, but had persistent mild bilateral frontal headaches since the incident, overall has improved, she is planning on to have elective surgery under general anesthesia in August 08 2015, want to make sure she is fine to going through general anesthesia,  She also reported a "TIA"episode, when she presented with lateralized paresthesia, and transient weakness in 1988, she has no recollection of the detail anymore, we have personally reviewed MRI of the brain in November 2014, single isolated left periventricular gliosis.   MRI a of the brain and neck showed no large vessel disease,   REVIEW OF SYSTEMS: Full 14 system review of systems performed and notable only for dizziness, headache, bruise easily  ALLERGIES: No Known Allergies  HOME MEDICATIONS: Outpatient Prescriptions Prior to Visit  Medication Sig Dispense Refill  . Ascorbic Acid (VITAMIN C) 1000 MG tablet Take 1,000 mg by mouth daily.      . Cholecalciferol (VITAMIN D-3 PO) Take by mouth.      . cyanocobalamin 1000 MCG tablet Take 100 mcg by mouth daily.      Marland Kitchen ibuprofen (ADVIL,MOTRIN) 600 MG tablet Take 1 tablet (600 mg total) by mouth 3 (three) times daily.  21 tablet  0  . Nutritional Supplements (WOMENS FORMULA MENOPAUSE PO) Take by mouth.      . ondansetron (ZOFRAN) 4 MG tablet Take 1 tablet (4 mg total) by mouth every 6 (six) hours.  12 tablet  0  . topiramate (TOPAMAX) 25 MG tablet Take 25 mg by mouth daily as needed (for headaches due to prior concussion).         PAST MEDICAL HISTORY: Past Medical History  Diagnosis Date  . Chest pain   . Head pain   . Dizziness     PAST SURGICAL  HISTORY: Past Surgical History  Procedure Laterality Date  . Tubal ligation    . Appendectomy    . Tonsillectomy    . Cervical cone biopsy      FAMILY HISTORY: Family History  Problem Relation Age of Onset  . Coronary artery disease    . Heart disease Father   . Heart failure Father   . Cancer Mother     SOCIAL HISTORY:  History   Social History  . Marital Status: Married    Spouse Name: [name]    Number of Children: 6  . Years of Education: 55   Occupational History  . 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 Smoking status: Never Smoker   . Smokeless tobacco: Never Used  . Alcohol Use: No  . Drug Use: No  . Sexual Activity: Not on file   Other Topics Concern  . Not on file   Social History Narrative   Patient is married Conservation officer, historic buildings) and lives at home with her husband and her daughter.   Patient has five living children and one is deceased.   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   Patient is right handed.   Patient drinks very little caffeine.     PHYSICAL EXAM   Filed Vitals:   08/04/15 1057  BP: 118/76  Pulse: 84  Height:  (1.676 m)  Weight: 178 lb (80.74 kg)     Body mass index is 28.74 kg/(m^2).   Generalized: In no acute distress  Neck: Supple, no carotid bruits   Cardiac: Regular rate rhythm  Pulmonary: Clear to auscultation bilaterally  Musculoskeletal: No deformity  Neurological examination  Mentation: Alert oriented to time, place, history taking, and causual conversation  Cranial nerve II-XII: Pupils were equal round reactive to light extraocular movements were full, Visual field were full on confrontational test. Bilateral fundi were sharp.  Facial sensation and strength were normal. Hearing was intact to finger rubbing bilaterally. Uvula tongue midline.  head turning and shoulder shrug and were normal and symmetric.Tongue protrusion into cheek strength was  normal.  Motor: normal tone, bulk and strength.  Sensory: Intact to fine touch, pinprick, preserved vibratory sensation, and proprioception at toes.  Coordination: Normal finger to nose, heel-to-shin bilaterally there was no truncal ataxia  Gait: Rising up from seated position without assistance, normal stance, without trunk ataxia, moderate stride, good arm swing, smooth turning, able to perform tiptoe, and heel walking without difficulty.   Romberg signs: Negative  Deep tendon reflexes: Brachioradialis 2/2, biceps 2/2, triceps 2/2, patellar 2/2, Achilles 2/2, plantar responses were flexor bilaterally.  I have performed repositioning maneuver, there was no nystagmus, no vertigo induced with right or left year dependent position.   DIAGNOSTIC DATA (LABS, IMAGING, TESTING) - I reviewed patient records, labs, notes, testing and imaging myself where available.  Lab Results  Component Value Date   WBC 3.7* 04/02/2015   HGB 11.4* 04/02/2015   HCT 37.1 04/02/2015   MCV 83.6 04/02/2015   PLT 279 04/02/2015      Component Value Date/Time   NA 138 04/02/2015 1243   K 3.6 04/02/2015 1243   CL 104 04/02/2015 1243   CO2 22 04/02/2015 1243   GLUCOSE 91 04/02/2015 1243   BUN 10 04/02/2015 1243   CREATININE 0.80 04/02/2015 1243   CALCIUM 9.9 04/02/2015 1243   PROT 6.9 04/09/2014 0835   ALBUMIN 3.4* 04/09/2014 0835   AST 21 04/09/2014 0835   ALT 11 04/09/2014 0835   ALKPHOS 69 04/09/2014 0835   BILITOT 0.4 04/09/2014 0835   GFRNONAA >60 04/02/2015 1243   GFRAA >60 04/02/2015 1243   Lab Results  Component Value Date   VITAMINB12 1511* 03/10/2009    ASSESSMENT AND PLAN   70 years old right-handed African American female,  Tension headaches  We have personally reviewed previous MRI of the brain, only slight supratentorium small vessel disease, MRA of the brain and neck, there was no significant large vessel disease,  She is fine to proceed with planned elective surgery under  general anesthesia,  When necessary Tylenol for headaches, avoid NSAIDs, aspirin a week prior to surgery  Levert Feinstein, M.D. Ph.D.  [facility] [address] [place], [place] [address] [phone]

## 2015-08-05 NOTE — Patient Instructions (Addendum)
Your procedure is scheduled on:  Friday, August 08, 2015  Enter through the Main Entrance of [facility] at:  11:30 AM  Pick up the phone at the desk and dial [phone].  Call this number if you have problems the morning of surgery: [phone].  Remember:  Do NOT eat food:  After midnight Thursday  Do NOT drink clear liquids after:  9:00 AM day of surgery  Take these medicines the morning of surgery with a SIP OF WATER:  None  Stop taking Omega 3 fatty acid supplements at this time  Do NOT wear jewelry (body piercing), metal hair clips/bobby pins, make-up, or nail polish. Do NOT wear lotions, powders, or perfumes.  You may wear deoderant. Do NOT shave for 48 hours prior to surgery. Do NOT bring valuables to the hospital. Contacts, dentures, or bridgework may not be worn into surgery.  Have a responsible adult drive you home and stay with you for 24 hours after your procedure

## 2015-08-06 ENCOUNTER — Encounter (HOSPITAL_COMMUNITY): Payer: Self-pay

## 2015-08-06 ENCOUNTER — Encounter (HOSPITAL_COMMUNITY)
Admission: RE | Admit: 2015-08-06 | Discharge: 2015-08-06 | Disposition: A | Discharge status: Home / Self Care | Payer: Medicare Other | Source: Ambulatory Visit | Attending: Obstetrics and Gynecology | Admitting: Obstetrics and Gynecology

## 2015-08-06 DIAGNOSIS — Z01812 Encounter for preprocedural laboratory examination: Secondary | ICD-10-CM | POA: Insufficient documentation

## 2015-08-06 HISTORY — DX: Adverse effect of unspecified anesthetic, initial encounter: T41.45XA

## 2015-08-06 HISTORY — DX: Disease of pericardium, unspecified: I31.9

## 2015-08-06 HISTORY — DX: Unspecified osteoarthritis, unspecified site: M19.90

## 2015-08-06 HISTORY — DX: Transient cerebral ischemic attack, unspecified: G45.9

## 2015-08-06 HISTORY — DX: Angina pectoris, unspecified: I20.9

## 2015-08-06 HISTORY — DX: Other complications of anesthesia, initial encounter: T88.59XA

## 2015-08-06 NOTE — Anesthesia Preprocedure Evaluation (Signed)
Anesthesia Evaluation  Patient identified by MRN, date of birth, ID band Patient awake  General Assessment Comment:Had TIA 3 days post op cystocele repair  Reviewed: Allergy & Precautions, NPO status , Patient's Chart, lab work & pertinent test results  Airway Mallampati: II  TM Distance: >3 FB Neck ROM: Full    Dental no notable dental hx. (+) Teeth Intact   Pulmonary neg pulmonary ROS,    Pulmonary exam normal breath sounds clear to auscultation       Cardiovascular + angina Normal cardiovascular exam Rhythm:Regular Rate:Normal  Had pericarditis Nov. 2016 took colchicine for 3 months - resolved Last Echo 2015 LVEF 60-65%, Grade I diastolic dysfunction   Neuro/Psych  Headaches, Hit her head 2 weeks ago, seen by her neurologist who feels she is stable for planned GYN surgery TIAnegative psych ROS   GI/Hepatic negative GI ROS, Neg liver ROS,   Endo/Other  negative endocrine ROS  Renal/GU negative Renal ROS  negative genitourinary   Musculoskeletal  (+) Arthritis ,   Abdominal   Peds  Hematology negative hematology ROS (+)   Anesthesia Other Findings   Reproductive/Obstetrics Post Menopausal Bleeding Endometrial mass                             Anesthesia Physical Anesthesia Plan  ASA: II  Anesthesia Plan: General   Post-op Pain Management:    Induction: Intravenous  Airway Management Planned: LMA  Additional Equipment:   Intra-op Plan:   Post-operative Plan: Extubation in OR  Informed Consent: I have reviewed the patients History and Physical, chart, labs and discussed the procedure including the risks, benefits and alternatives for the proposed anesthesia with the patient or authorized representative who has indicated his/her understanding and acceptance.   Dental advisory given  Plan Discussed with: CRNA, Anesthesiologist and Surgeon  Anesthesia Plan Comments:          Anesthesia Quick Evaluation

## 2015-08-08 ENCOUNTER — Ambulatory Visit (HOSPITAL_COMMUNITY): Payer: Medicare Other | Admitting: Anesthesiology

## 2015-08-08 ENCOUNTER — Encounter (HOSPITAL_COMMUNITY)
Admission: RE | Disposition: A | Discharge status: Home / Self Care | Payer: Self-pay | Source: Ambulatory Visit | Attending: Obstetrics and Gynecology

## 2015-08-08 ENCOUNTER — Encounter (HOSPITAL_COMMUNITY): Payer: Self-pay

## 2015-08-08 ENCOUNTER — Ambulatory Visit (HOSPITAL_COMMUNITY)
Admission: RE | Admit: 2015-08-08 | Discharge: 2015-08-08 | Disposition: A | Discharge status: Home / Self Care | Payer: Medicare Other | Source: Ambulatory Visit | Attending: Obstetrics and Gynecology | Admitting: Obstetrics and Gynecology

## 2015-08-08 DIAGNOSIS — Z8673 Personal history of transient ischemic attack (TIA), and cerebral infarction without residual deficits: Secondary | ICD-10-CM | POA: Diagnosis not present

## 2015-08-08 DIAGNOSIS — N840 Polyp of corpus uteri: Secondary | ICD-10-CM | POA: Insufficient documentation

## 2015-08-08 DIAGNOSIS — N950 Postmenopausal bleeding: Secondary | ICD-10-CM | POA: Insufficient documentation

## 2015-08-08 HISTORY — PX: DILATATION & CURETTAGE/HYSTEROSCOPY WITH MYOSURE: SHX6511

## 2015-08-08 SURGERY — DILATATION & CURETTAGE/HYSTEROSCOPY WITH MYOSURE
Anesthesia: General

## 2015-08-08 MED ORDER — KETOROLAC TROMETHAMINE 30 MG/ML IJ SOLN
INTRAMUSCULAR | Status: AC
  Filled 2015-08-08: qty 1

## 2015-08-08 MED ORDER — LIDOCAINE HCL (CARDIAC) 20 MG/ML IV SOLN
INTRAVENOUS | Status: DC | PRN
Start: 2015-08-08 — End: 2015-08-08
  Administered 2015-08-08: 80 mg via INTRAVENOUS

## 2015-08-08 MED ORDER — ONDANSETRON HCL 4 MG/2ML IJ SOLN
INTRAMUSCULAR | Status: AC
  Filled 2015-08-08: qty 2

## 2015-08-08 MED ORDER — MIDAZOLAM HCL 2 MG/2ML IJ SOLN
INTRAMUSCULAR | Status: AC
  Filled 2015-08-08: qty 2

## 2015-08-08 MED ORDER — GLYCOPYRROLATE 0.2 MG/ML IJ SOLN
INTRAMUSCULAR | Status: DC | PRN
  Administered 2015-08-08 (×2): 0.1 mg via INTRAVENOUS

## 2015-08-08 MED ORDER — EPHEDRINE SULFATE 50 MG/ML IJ SOLN
INTRAMUSCULAR | Status: DC | PRN
  Administered 2015-08-08 (×3): 5 mg via INTRAVENOUS

## 2015-08-08 MED ORDER — FENTANYL CITRATE (PF) 100 MCG/2ML IJ SOLN: 25.0000 ug | INTRAMUSCULAR | Status: DC | PRN

## 2015-08-08 MED ORDER — LIDOCAINE HCL (CARDIAC) 20 MG/ML IV SOLN
INTRAVENOUS | Status: AC
  Filled 2015-08-08: qty 5

## 2015-08-08 MED ORDER — PHENYLEPHRINE 40 MCG/ML (10ML) SYRINGE FOR IV PUSH (FOR BLOOD PRESSURE SUPPORT)
PREFILLED_SYRINGE | INTRAVENOUS | Status: AC
  Filled 2015-08-08: qty 10

## 2015-08-08 MED ORDER — FENTANYL CITRATE (PF) 100 MCG/2ML IJ SOLN
INTRAMUSCULAR | Status: DC | PRN
Start: 2015-08-08 — End: 2015-08-08
  Administered 2015-08-08: 1 ug via INTRAVENOUS
  Administered 2015-08-08: 50 ug via INTRAVENOUS

## 2015-08-08 MED ORDER — MEPERIDINE HCL 25 MG/ML IJ SOLN: 6.2500 mg | INTRAMUSCULAR | Status: DC | PRN

## 2015-08-08 MED ORDER — SODIUM CHLORIDE 0.9 % IR SOLN
Status: DC | PRN
  Administered 2015-08-08: 3000 mL

## 2015-08-08 MED ORDER — FENTANYL CITRATE (PF) 100 MCG/2ML IJ SOLN
INTRAMUSCULAR | Status: AC
  Filled 2015-08-08: qty 2

## 2015-08-08 MED ORDER — PROPOFOL 10 MG/ML IV BOLUS
INTRAVENOUS | Status: AC
  Filled 2015-08-08: qty 20

## 2015-08-08 MED ORDER — DEXAMETHASONE SODIUM PHOSPHATE 10 MG/ML IJ SOLN
INTRAMUSCULAR | Status: DC | PRN
  Administered 2015-08-08: 4 mg via INTRAVENOUS

## 2015-08-08 MED ORDER — PROMETHAZINE HCL 25 MG/ML IJ SOLN: 6.2500 mg | INTRAMUSCULAR | Status: DC | PRN

## 2015-08-08 MED ORDER — KETOROLAC TROMETHAMINE 30 MG/ML IJ SOLN
INTRAMUSCULAR | Status: DC | PRN
  Administered 2015-08-08: 30 mg via INTRAVENOUS
  Administered 2015-08-08: 30 mg via INTRAMUSCULAR

## 2015-08-08 MED ORDER — ONDANSETRON HCL 4 MG/2ML IJ SOLN
INTRAMUSCULAR | Status: DC | PRN
  Administered 2015-08-08: 4 mg via INTRAVENOUS

## 2015-08-08 MED ORDER — CHLOROPROCAINE HCL 1 % IJ SOLN
INTRAMUSCULAR | Status: DC | PRN
  Administered 2015-08-08: 20 mL

## 2015-08-08 MED ORDER — CHLOROPROCAINE HCL 1 % IJ SOLN
INTRAMUSCULAR | Status: AC
  Filled 2015-08-08: qty 30

## 2015-08-08 MED ORDER — METOCLOPRAMIDE HCL 5 MG/ML IJ SOLN: 10.0000 mg | Freq: Once | INTRAMUSCULAR | Status: DC | PRN

## 2015-08-08 MED ORDER — DEXAMETHASONE SODIUM PHOSPHATE 4 MG/ML IJ SOLN
INTRAMUSCULAR | Status: AC
  Filled 2015-08-08: qty 1

## 2015-08-08 MED ORDER — LACTATED RINGERS IV SOLN: INTRAVENOUS | Status: DC

## 2015-08-08 MED ORDER — PHENYLEPHRINE HCL 10 MG/ML IJ SOLN
INTRAMUSCULAR | Status: DC | PRN
  Administered 2015-08-08 (×2): 40 ug via INTRAVENOUS

## 2015-08-08 MED ORDER — GLYCOPYRROLATE 0.2 MG/ML IJ SOLN
INTRAMUSCULAR | Status: AC
  Filled 2015-08-08: qty 1

## 2015-08-08 MED ORDER — PROPOFOL 10 MG/ML IV BOLUS
INTRAVENOUS | Status: DC | PRN
  Administered 2015-08-08: 150 mg via INTRAVENOUS

## 2015-08-08 SURGICAL SUPPLY — 20 items
CANISTER SUCT 3000ML (MISCELLANEOUS) ×2 IMPLANT
CATH ROBINSON RED A/P 16FR (CATHETERS) ×2 IMPLANT
CLOTH BEACON ORANGE TIMEOUT ST (SAFETY) ×2 IMPLANT
CONTAINER PREFILL 10% NBF 60ML (FORM) ×4 IMPLANT
DEVICE MYOSURE LITE (MISCELLANEOUS) IMPLANT
DEVICE MYOSURE REACH (MISCELLANEOUS) ×2 IMPLANT
ELECT REM PT RETURN 9FT ADLT (ELECTROSURGICAL) ×2
ELECTRODE REM PT RTRN 9FT ADLT (ELECTROSURGICAL) ×1 IMPLANT
FILTER ARTHROSCOPY CONVERTOR (FILTER) ×2 IMPLANT
GLOVE BIOGEL PI IND STRL 7.0 (GLOVE) ×3 IMPLANT
GLOVE BIOGEL PI INDICATOR 7.0 (GLOVE) ×3
GLOVE ECLIPSE 6.5 STRL STRAW (GLOVE) ×2 IMPLANT
GOWN STRL REUS W/TWL LRG LVL3 (GOWN DISPOSABLE) ×4 IMPLANT
PACK VAGINAL MINOR WOMEN LF (CUSTOM PROCEDURE TRAY) ×2 IMPLANT
PAD OB MATERNITY 4.3X12.25 (PERSONAL CARE ITEMS) ×2 IMPLANT
SEAL ROD LENS SCOPE MYOSURE (ABLATOR) ×2 IMPLANT
TOWEL OR 17X24 6PK STRL BLUE (TOWEL DISPOSABLE) ×4 IMPLANT
TUBING AQUILEX INFLOW (TUBING) ×2 IMPLANT
TUBING AQUILEX OUTFLOW (TUBING) ×2 IMPLANT
WATER STERILE IRR 1000ML POUR (IV SOLUTION) ×2 IMPLANT

## 2015-08-08 NOTE — Brief Op Note (Signed)
08/08/2015  1:48 PM  PATIENT:  [name]  70 y.o. female  PRE-OPERATIVE DIAGNOSIS:  Postmenopausal Bleeding, Endometrial Mass  POST-OPERATIVE DIAGNOSIS:  Postmenopausal Bleeding  PROCEDURE:  Diagnostic hysteroscopy, hysteroscopic resection of endometrial thickening, D&C  SURGEON:  Surgeon(s) and Role:    * Jamaar Howes, MD - Primary  PHYSICIAN ASSISTANT:   ASSISTANTS: none   ANESTHESIA:   general and paracervical block Findings: tubal ostia seen  Fundal left area thickening( ant to post), atrophic endometrium, nl endocervical canal EBL:  Total I/O In: -  Out: 16 [Urine:16]  BLOOD ADMINISTERED:none  DRAINS: none   LOCAL MEDICATIONS USED:  OTHER nesicaine  SPECIMEN:  Source of Specimen:  emc with?polyp  DISPOSITION OF SPECIMEN:  PATHOLOGY  COUNTS:  YES  TOURNIQUET:  * No tourniquets in log *  DICTATION: .Other Dictation: Dictation Number [id number]  PLAN OF CARE: Discharge to home after PACU  PATIENT DISPOSITION:  PACU - hemodynamically stable.   Delay start of Pharmacological VTE agent (>24hrs) due to surgical blood loss or risk of bleeding: no

## 2015-08-08 NOTE — Discharge Instructions (Addendum)
CALL  IF TEMP>100.4, NOTHING PER VAGINA X 1WK, CALL IF SOAKING A MAXI  PAD EVERY HOUR OR MORE FREQUENTLYDISCHARGE INSTRUCTIONS: HYSTEROSCOPY / ENDOMETRIAL ABLATION The following instructions have been prepared to help you care for yourself upon your return home.    May take Ibuprofen after     7:30 pm  May take stool softner while taking narcotic pain medication to prevent constipation.  Drink plenty of water.  Personal hygiene:  Use sanitary pads for vaginal drainage, not tampons.  Shower the day after your procedure.  NO tub baths, pools or Jacuzzis for 2-3 weeks.  Wipe front to back after using the bathroom.  Activity and limitations:  Do NOT drive or operate any equipment for 24 hours. The effects of anesthesia are still present and drowsiness may result.  Do NOT rest in bed all day.  Walking is encouraged.  Walk up and down stairs slowly.  You may resume your normal activity in one to two days or as indicated by your physician. Sexual activity: NO intercourse for at least 2 weeks after the procedure, or as indicated by your Doctor.  Diet: Eat a light meal as desired this evening. You may resume your usual diet tomorrow.  Return to Work: You may resume your work activities in one to two days or as indicated by Therapist, sportsyour Doctor.  What to expect after your surgery: Expect to have vaginal bleeding/discharge for 2-3 days and spotting for up to 10 days. It is not unusual to have soreness for up to 1-2 weeks. You may have a slight burning sensation when you urinate for the first day. Mild cramps may continue for a couple of days. You may have a regular period in 2-6 weeks.  Call your doctor for any of the following:  Excessive vaginal bleeding or clotting, saturating and changing one pad every hour.  Inability to urinate 6 hours after discharge from hospital.  Pain not relieved by pain medication.  Fever of 100.4 F or greater.  Unusual vaginal discharge or  odor.  Return to office _________________Call for an appointment ___________________ Patients signature: ______________________ Nurses signature ________________________  Post Anesthesia Care Unit [phone] Post Anesthesia Home Care Instructions  Activity: Get plenty of rest for the remainder of the day. A responsible adult should stay with you for 24 hours following the procedure.  For the next 24 hours, DO NOT: -Drive a car -Advertising copywriterperate machinery -Drink alcoholic beverages -Take any medication unless instructed by your physician -Make any legal decisions or sign important papers.  Meals: Start with liquid foods such as gelatin or soup. Progress to regular foods as tolerated. Avoid greasy, spicy, heavy foods. If nausea and/or vomiting occur, drink only clear liquids until the nausea and/or vomiting subsides. Call your physician if vomiting continues.  Special Instructions/Symptoms: Your throat may feel dry or sore from the anesthesia or the breathing tube placed in your throat during surgery. If this causes discomfort, gargle with warm salt water. The discomfort should disappear within 24 hours.

## 2015-08-08 NOTE — Anesthesia Postprocedure Evaluation (Signed)
Anesthesia Post Note  Patient: [name]  Procedure(s) Performed: Procedure(s) (LRB): DILATATION & CURETTAGE/HYSTEROSCOPY WITH MYOSURE (N/A)  Patient location during evaluation: PACU Anesthesia Type: General Level of consciousness: awake and alert Pain management: pain level controlled Vital Signs Assessment: post-procedure vital signs reviewed and stable Respiratory status: spontaneous breathing, nonlabored ventilation, respiratory function stable and patient connected to nasal cannula oxygen Cardiovascular status: blood pressure returned to baseline and stable Postop Assessment: no signs of nausea or vomiting Anesthetic complications: no    Last Vitals:  Filed Vitals:   08/08/15 1430 08/08/15 1445  BP: 121/76 119/74  Pulse: 74 63  Temp:    Resp: 13 12    Last Pain:  Filed Vitals:   08/08/15 1505  PainSc: Asleep                 Phillips Groutarignan, Lorie Melichar

## 2015-08-08 NOTE — Anesthesia Procedure Notes (Signed)
Procedure Name: LMA Insertion Date/Time: 08/08/2015 1:25 PM Performed by: Earmon PhoenixWILKERSON, Mayte Diers P Pre-anesthesia Checklist: Patient identified, Emergency Drugs available, Suction available, Patient being monitored and Timeout performed Patient Re-evaluated:Patient Re-evaluated prior to inductionOxygen Delivery Method: Circle system utilized Preoxygenation: Pre-oxygenation with 100% oxygen Intubation Type: IV induction Ventilation: Mask ventilation without difficulty LMA: LMA inserted LMA Size: 4.0 Number of attempts: 1 Placement Confirmation: positive ETCO2,  CO2 detector and breath sounds checked- equal and bilateral Tube secured with: Tape Dental Injury: Teeth and Oropharynx as per pre-operative assessment

## 2015-08-08 NOTE — Transfer of Care (Signed)
Immediate Anesthesia Transfer of Care Note  Patient: [name]  Procedure(s) Performed: Procedure(s): DILATATION & CURETTAGE/HYSTEROSCOPY WITH MYOSURE (N/A)  Patient Location: PACU  Anesthesia Type:General  Level of Consciousness: awake, alert , oriented and patient cooperative  Airway & Oxygen Therapy: Patient Spontanous Breathing and Patient connected to nasal cannula oxygen  Post-op Assessment: Report given to RN and Post -op Vital signs reviewed and stable  Post vital signs: Reviewed and stable  Last Vitals:  Filed Vitals:   08/08/15 1141  BP: 132/78  Pulse: 76  Temp: 36.8 C  Resp: 20    Complications: No apparent anesthesia complications

## 2015-08-08 NOTE — H&P (Signed)
Joan GrillsDiane [name] is an 70 y.o. female. 176P6 Married PMP BF not on HRT here for surgical evaluation of PMB.  sono done 2/21 showed thin smooth endometrial walls but 1.2 cm endom mass with internal BF, nl ovaries  Pertinent Gynecological History: Menses: post-menopausal Bleeding: post menopausal bleeding Contraception: none DES exposure: denies Blood transfusions: none Sexually transmitted diseases: no past history Previous GYN Procedures: conization  Last mammogram: normal Date: 05/15/2015 Last pap: normal Date: 07/22/2014 OB History: G6, P6   Menstrual History: Menarche age: n/a No LMP recorded. Patient is postmenopausal.    Past Medical History  Diagnosis Date  . Chest pain   . Dizziness   . Anginal pain (HCC)   . Pericarditis 04/2015  . TIA (transient ischemic attack) 1988    occurred three days after anesthesia  . Head pain 07/25/15    recent blow to head  . Arthritis     bilateral knees  . Complication of anesthesia     see note about TIA    Past Surgical History  Procedure Laterality Date  . Tubal ligation    . Appendectomy    . Tonsillectomy    . Cervical cone biopsy    . Cystocele repair    . Colonoscopy      Family History  Problem Relation Age of Onset  . Coronary artery disease    . Heart disease Father   . Heart failure Father   . Cancer Mother     Social History:  reports that she has never smoked. She has never used smokeless tobacco. She reports that she does not drink alcohol or use illicit drugs.  Allergies: No Known Allergies  No prescriptions prior to admission    Review of Systems  All other systems reviewed and are negative.   There were no vitals taken for this visit. Physical Exam  Constitutional: She appears well-developed and well-nourished.  HENT:  Head: Atraumatic.  Eyes: EOM are normal.  Neck: Neck supple.  Cardiovascular: Regular rhythm.   Respiratory: Effort normal.  GI: Soft.  Neurological: She is alert.  Skin: Skin  is warm and dry.  Psychiatric: She has a normal mood and affect.  vulva nl Vagina atrophic vaginitis Cervix nl uterus RV Adnexa nl  No results found for this or any previous visit (from the past 24 hour(s)).  No results found.  Assessment/Plan: PMB Endometrial mass P) dx hysteroscopy, D&C, resection of mass if present. Risk of surgery reviewed including  Infection, bleeding, uterine perforation and its risk, injury to surrounding organ structures, internal] Scar tissue, fluid overload, thermal injury. ALL ? Answered. cytotec pre-surgery  [name] A 08/08/2015, 10:59 AM

## 2015-08-09 NOTE — Op Note (Signed)
NAMBrenton Grills:  [name], [name]             ACCOUNT NO.:  [id number]  MEDICAL RECORD NO.:  [id number]  LOCATION:  [facility]                          FACILITY:  [facility]  PHYSICIAN:  Maxie BetterSheronette Madiha Bambrick, M.D.DATE OF BIRTH:  Aug 12, 1945  DATE OF PROCEDURE:  08/08/2015 DATE OF DISCHARGE:  08/08/2015                              OPERATIVE REPORT   PREOPERATIVE DIAGNOSIS:  Postmenopausal bleeding.  Endometrial mass.  POSTOPERATIVE DIAGNOSIS:  Postmenopausal bleeding.  PROCEDURES:  Diagnostic hysteroscopy, hysteroscopic resection using MyoSure, dilation and curettage.  ANESTHESIA:  General, paracervical block.  SURGEON:  Maxie BetterSheronette Jaloni Sorber, MD.  ASSISTANT:  None.  DESCRIPTION OF PROCEDURE:  Under adequate general anesthesia, the patient was placed in a dorsal lithotomy position.  She was sterilely prepped and draped in usual fashion.  Bladder was catheterized for moderate amount of urine.  Examination under anesthesia revealed an anteverted uterus.  No adnexal masses could be appreciated.  A bivalve speculum was placed in the vagina.  Single-tooth tenaculum was placed on the anterior lip of the cervix.  A 20 mL of 1% Nesacaine was injected at a paracervical locations at 3 and 9 o'clock position.  The cervix was then gently dilated up to a #21 Pratt dilator.  A MyoSure hysteroscope was introduced into the uterine cavity.  Both tubal ostia could be seen. Pale atrophic endometrium was noted.  On the right fundal area was a thickening but no actual discrete isolated mass was noted.  Nonetheless, the REACH apparatus for the MyoSure was then inserted, and that area of thickening focally was resected.  Some of the endometrium was also resected using the apparatus.  When it was felt to be adequate, both tubal ostia having been seen, endocervical canal without any lesion, the hysteroscope was removed.  The cavity was gently curetted for scant amount of tissue. All instruments removed from vagina.  SPECIMEN:   Labeled endometrial curetting possible endometrial polyp was sent to  Pathology.  ESTIMATED BLOOD LOSS:  Minimal.  FLUID DEFICIT:  250 mL.  COMPLICATION:  None.  The patient tolerated the procedure well and was transferred to recovery room in stable condition.     Maxie BetterSheronette Chrystal Zeimet, M.D.     [place]/MEDQ  D:  08/09/2015  T:  08/09/2015  Job:  [id number]

## 2015-08-11 ENCOUNTER — Encounter (HOSPITAL_COMMUNITY): Payer: Self-pay | Admitting: Obstetrics and Gynecology

## 2016-03-04 ENCOUNTER — Encounter: Payer: Self-pay | Admitting: Nurse Practitioner

## 2016-03-04 ENCOUNTER — Ambulatory Visit (INDEPENDENT_AMBULATORY_CARE_PROVIDER_SITE_OTHER): Payer: Medicare Other | Admitting: Nurse Practitioner

## 2016-03-04 VITALS — BP 109/72 | HR 80 | Ht 65.5 in | Wt 179.8 lb

## 2016-03-04 DIAGNOSIS — S060X0A Concussion without loss of consciousness, initial encounter: Secondary | ICD-10-CM | POA: Diagnosis not present

## 2016-03-04 DIAGNOSIS — R413 Other amnesia: Secondary | ICD-10-CM

## 2016-03-04 HISTORY — DX: Other amnesia: R41.3

## 2016-03-04 NOTE — Patient Instructions (Signed)
Given information on concussion Will set up for MRI of the brain Follow-up in 6 months

## 2016-03-04 NOTE — Progress Notes (Signed)
[facility]  PATIENT: [name] DOB: 1946-01-26   REASON FOR VISIT: Follow-up for history of concussion, memory loss HISTORY FROM: Patient and husband    HISTORY OF PRESENT ILLNESS: HISTORY  [name] is a 70 years old right-handed African American female, accompanied by her husband, referred by her primary care physician Dr. Andi Devon for evaluation of left-sided headaches in March 2015. She denies a previous history of migraine headaches, was very healthy, in October 18th 2014, at the hotel, close to midnight, she was trying to sit down at a rolling chair in front of the mirror but she mis-positioned herself, fell backwards on the floor, landed on her occipital region at the bathtub, there was no loss of consciousness, but she developed occipital area headaches afterwards, rest of the night, she was afraid of going to the sleep, She was fine for 3 weeks, until early November 2014, she began to develop frequent, almost daily headaches, left parietal, left frontal, left retro-orbital region, 5 out of 7 days, the pain was moderate to severe, debilitating for her, lasting for a few hours, she has to take multiple doses of ibuprofen, she presented to the emergency room, CAT scan of the brain was normal, MRI of the brain was normal, She also complains of mild blurry vision, no gait difficulty, She is very hesitate about the potential side effect of the medication, she does not want to go on any preventive medications  UPDATE March 3rd 2015:YY She no longer has headaches, has been doing very well until 3 weeks ago, she woke up in the middle of the night using bathroom, felt sudden onset vertigo, gait difficulty, she was able to go back to bed, when she woke up next morning, she continued to felt vertigo, dizziness, unsteady gait, ever since the event, with sudden movement, she felt unsteadiness, no hearing loss, no tinnitus, no double vision,  UPDATE Aug 04 2015:YY Last clinical visit was in March 2015, she has been doing well until July 25 2015, she bumped forcefully into a glass door, no loss of consciousness, but had persistent mild bilateral frontal headaches since the incident, overall has improved, she is planning on to have elective surgery under general anesthesia in August 08 2015, want to make sure she is fine to going through general anesthesia,  She also reported a "TIA"episode, when she presented with lateralized paresthesia, and transient weakness in 1988, she has no recollection of the detail anymore, we have personally reviewed MRI of the brain in November 2014, single isolated left periventricular gliosis. MRI a of the brain and neck showed no large vessel disease, UPDATE 03/04/2016 CM Ms Kanady, 70 year old female returns for follow-up with her husband. She was last seen by Dr. Terrace Arabia in February after she bumped into a glass door forcefully no loss of consciousness but had persistent headaches which overall have improved she no longer complains with headaches however she does complain with memory loss , she has problems with details. She also complains with fatigue. She wants to have repeat MRI. She has a little understanding of the signs and symptoms of concussion. She returns for reevaluation    REVIEW OF SYSTEMS: Full 14 system review of systems performed and notable only for those listed, all others are neg:  Constitutional: Fatigue  Cardiovascular: neg Ear/Nose/Throat: neg  Skin: neg Eyes: neg Respiratory: neg Gastroitestinal: neg  Hematology/Lymphatic: Easy bruising  Endocrine: Intolerance to cold Musculoskeletal:neg Allergy/Immunology: neg Neurological: Memory loss Psychiatric: neg Sleep : neg  ALLERGIES: No Known Allergies  HOME MEDICATIONS: Outpatient Medications Prior to Visit  Medication Sig Dispense Refill  . Ascorbic Acid (VITAMIN C) 1000 MG tablet Take 1,000 mg by mouth daily.    Marland Kitchen b complex  vitamins tablet Take 1 tablet by mouth daily.    . Biotin 1000 MCG tablet Take 1,000 mcg by mouth daily.    . Capsicum, Cayenne, (CAYENNE PO) Take 1 capsule by mouth daily.     . Cholecalciferol (VITAMIN D) 2000 units tablet Take 2,000 Units by mouth daily.    . COCONUT OIL PO Take 15 mLs by mouth daily.    . Grape Seed Extract 100 MG CAPS Take 1 capsule by mouth daily.    Marland Kitchen ibuprofen (ADVIL,MOTRIN) 200 MG tablet Take 200 mg by mouth every 6 (six) hours as needed for headache or moderate pain.    Marland Kitchen LECITHIN PO Take 10 g by mouth daily.     Marland Kitchen MAGNESIUM GLYCINATE PLUS PO Take 400 mg by mouth daily.     . NON FORMULARY Take 450 mg by mouth daily. Patient takes fennel    . Omega-3 Fatty Acids (OMEGA 3 PO) Take 715 mg by mouth daily.    Marland Kitchen OVER THE COUNTER MEDICATION Take 1 capsule by mouth daily. Patient takes Vegetal Silica    . Probiotic Product (PROBIOTIC & ACIDOPHILUS EX ST PO) Take by mouth.    Marland Kitchen Specialty Vitamins Products (ONE-A-DAY BONE STRENGTH PO) Take 3 tablets by mouth daily.    . Tetrahydrozoline HCl (VISINE OP) Apply 2 drops to eye daily as needed (dry eyes).     No facility-administered medications prior to visit.     PAST MEDICAL HISTORY: Past Medical History:  Diagnosis Date  . Anginal pain (HCC)   . Arthritis    bilateral knees  . Chest pain   . Complication of anesthesia    see note about TIA  . Dizziness   . Head pain 07/25/15   recent blow to head  . Pericarditis 04/2015  . TIA (transient ischemic attack) 1988   occurred three days after anesthesia    PAST SURGICAL HISTORY: Past Surgical History:  Procedure Laterality Date  . APPENDECTOMY    . CERVICAL CONE BIOPSY    . COLONOSCOPY    . CYSTOCELE REPAIR    . DILATATION & CURETTAGE/HYSTEROSCOPY WITH MYOSURE N/A 08/08/2015   Procedure: DILATATION & CURETTAGE/HYSTEROSCOPY WITH MYOSURE;  Surgeon: Maxie Better, MD;  Location: [facility];  Service: Gynecology;  Laterality: N/A;  . TONSILLECTOMY    . TUBAL  LIGATION      FAMILY HISTORY: Family History  Problem Relation Age of Onset  . Cancer Mother   . Heart disease Father   . Heart failure Father   . Coronary artery disease      SOCIAL HISTORY: Social History   Social History  . Marital status: Married    Spouse name: Athelstan  . Number of children: 6  . Years of education: 3   Occupational History  . Not on file.   Social History Main Topics  . Smoking status: Never Smoker  . Smokeless tobacco: Never Used  . Alcohol use No  . Drug use: No  . Sexual activity: Yes    Birth control/ protection: Post-menopausal   Other Topics Concern  . Not on file   Social History Narrative   Patient is married Conservation officer, historic buildings) and lives at home with her husband and her daughter.   Patient has five living children and one  is deceased.   Patient is a retired Runner, broadcasting/film/videoteacher.   Patient has a Scientist, water qualityMasters.   Patient is right handed.   Patient drinks very little caffeine.     PHYSICAL EXAM  Vitals:   03/04/16 1433  BP: 109/72  Pulse: 80  Weight: 179 lb 12.8 oz (81.6 kg)  Height: 5' 5.5" (1.664 m)   Body mass index is 29.47 kg/m.  Generalized: Well developed, in no acute distress , well-groomed Head: normocephalic and atraumatic,. Oropharynx benign  Neck: Supple, no carotid bruits  Cardiac: Regular rate rhythm, no murmur  Musculoskeletal: No deformity   Neurological examination   Mentation: Alert oriented to time, place, history taking. MOCA 22/30.  Montreal Cognitive Assessment  03/04/2016  Visuospatial/ Executive (0/5) 3  Naming (0/3) 3  Attention: Read list of digits (0/2) 1  Attention: Read list of letters (0/1) 1  Attention: Serial 7 subtraction starting at 100 (0/3) 0  Language: Repeat phrase (0/2) 2  Language : Fluency (0/1) 1  Abstraction (0/2) 2  Delayed Recall (0/5) 3  Orientation (0/6) 6  Total 22    Follows all commands speech and language fluent.   Cranial nerve II-XII: Fundoscopic exam reveals sharp disc  margins.Pupils were equal round reactive to light extraocular movements were full, visual field were full on confrontational test. Facial sensation and strength were normal. hearing was intact to finger rubbing bilaterally. Uvula tongue midline. head turning and shoulder shrug were normal and symmetric.Tongue protrusion into cheek strength was normal. Motor: normal bulk and tone, full strength in the BUE, BLE, fine finger movements normal, no pronator drift. No focal weakness Sensory: normal and symmetric to light touch, pinprick, and  Vibration, proprioception  Coordination: finger-nose-finger, heel-to-shin bilaterally, no dysmetria Reflexes: Brachioradialis 2/2, biceps 2/2, triceps 2/2, patellar 2/2, Achilles 2/2, plantar responses were flexor bilaterally. Gait and Station: Rising up from seated position without assistance, normal stance,  moderate stride, good arm swing, smooth turning, able to perform tiptoe, and heel walking without difficulty. Tandem gait is steady  DIAGNOSTIC DATA (LABS, IMAGING, TESTING) - I reviewed patient records, labs, notes, testing and imaging myself where available.  Lab Results  Component Value Date   WBC 3.7 (L) 04/02/2015   HGB 11.4 (L) 04/02/2015   HCT 37.1 04/02/2015   MCV 83.6 04/02/2015   PLT 279 04/02/2015      Component Value Date/Time   NA 138 04/02/2015 1243   K 3.6 04/02/2015 1243   CL 104 04/02/2015 1243   CO2 22 04/02/2015 1243   GLUCOSE 91 04/02/2015 1243   BUN 10 04/02/2015 1243   CREATININE 0.80 04/02/2015 1243   CALCIUM 9.9 04/02/2015 1243   PROT 6.9 04/09/2014 0835   ALBUMIN 3.4 (L) 04/09/2014 0835   AST 21 04/09/2014 0835   ALT 11 04/09/2014 0835   ALKPHOS 69 04/09/2014 0835   BILITOT 0.4 04/09/2014 0835   GFRNONAA >60 04/02/2015 1243   GFRAA >60 04/02/2015 1243    ASSESSMENT AND PLAN  70 y.o. year old female  has a past medical history of  Dizziness; Head pain (07/25/15); Pericarditis (04/2015); and TIA (transient ischemic  attack) (1988). And concussion here to follow-up. She is complaining with some mild memory loss. MOCA 22/30.   Given information on concussion 5 page handout  Will set up for MRI of the brain Follow-up in 6 months Nilda RiggsNancy Carolyn Susan Arana, [facility] Hospital SpringfieldGNP, [facility] General Medical CenterBC, APRN  [facility] [address], [address] [place], [place] [address] [phone]

## 2016-03-08 NOTE — Progress Notes (Signed)
I have reviewed and agreed above plan. 

## 2016-04-28 DIAGNOSIS — W231XXA Caught, crushed, jammed, or pinched between stationary objects, initial encounter: Secondary | ICD-10-CM | POA: Diagnosis not present

## 2016-04-28 DIAGNOSIS — Y999 Unspecified external cause status: Secondary | ICD-10-CM | POA: Insufficient documentation

## 2016-04-28 DIAGNOSIS — Z8673 Personal history of transient ischemic attack (TIA), and cerebral infarction without residual deficits: Secondary | ICD-10-CM | POA: Insufficient documentation

## 2016-04-28 DIAGNOSIS — Y929 Unspecified place or not applicable: Secondary | ICD-10-CM | POA: Diagnosis not present

## 2016-04-28 DIAGNOSIS — Z79899 Other long term (current) drug therapy: Secondary | ICD-10-CM | POA: Insufficient documentation

## 2016-04-28 DIAGNOSIS — S92515B Nondisplaced fracture of proximal phalanx of left lesser toe(s), initial encounter for open fracture: Secondary | ICD-10-CM | POA: Insufficient documentation

## 2016-04-28 DIAGNOSIS — Y939 Activity, unspecified: Secondary | ICD-10-CM | POA: Diagnosis not present

## 2016-04-28 DIAGNOSIS — S99922A Unspecified injury of left foot, initial encounter: Secondary | ICD-10-CM | POA: Diagnosis present

## 2016-04-29 ENCOUNTER — Emergency Department (HOSPITAL_COMMUNITY): Payer: Medicare Other

## 2016-04-29 ENCOUNTER — Encounter (HOSPITAL_COMMUNITY): Payer: Self-pay | Admitting: *Deleted

## 2016-04-29 ENCOUNTER — Emergency Department (HOSPITAL_COMMUNITY)
Admission: EM | Admit: 2016-04-29 | Discharge: 2016-04-29 | Disposition: A | Discharge status: Home / Self Care | Payer: Medicare Other | Attending: Emergency Medicine | Admitting: Emergency Medicine

## 2016-04-29 DIAGNOSIS — S92515B Nondisplaced fracture of proximal phalanx of left lesser toe(s), initial encounter for open fracture: Secondary | ICD-10-CM

## 2016-04-29 MED ORDER — TETANUS-DIPHTH-ACELL PERTUSSIS 5-2.5-18.5 LF-MCG/0.5 IM SUSP
0.5000 mL | Freq: Once | INTRAMUSCULAR | Status: AC
  Administered 2016-04-29: 0.5 mL via INTRAMUSCULAR
  Filled 2016-04-29: qty 0.5

## 2016-04-29 MED ORDER — CEPHALEXIN 500 MG PO CAPS: 500.0000 mg | ORAL_CAPSULE | Freq: Two times a day (BID) | ORAL | 0 refills | Status: DC

## 2016-04-29 MED ORDER — CEPHALEXIN 250 MG PO CAPS
500.0000 mg | ORAL_CAPSULE | Freq: Once | ORAL | Status: AC
  Administered 2016-04-29: 500 mg via ORAL
  Filled 2016-04-29: qty 2

## 2016-04-29 MED ORDER — LIDOCAINE-EPINEPHRINE 1 %-1:100000 IJ SOLN
10.0000 mL | Freq: Once | INTRAMUSCULAR | Status: AC
  Administered 2016-04-29: 10 mL via INTRADERMAL
  Filled 2016-04-29: qty 1

## 2016-04-29 NOTE — ED Provider Notes (Signed)
[facility]-EMERGENCY DEPT Provider Note   CSN: [id number] Arrival date & time: 04/28/16  2356  By signing my name below, I, [name], attest that this documentation has been prepared under the direction and in the presence of Tomasita CrumbleAdeleke Rehmat Murtagh, MD . Electronically Signed: Freida Busmaniana [name], Scribe. 04/29/2016. 2:59 AM.    History   Chief Complaint Chief Complaint  Patient presents with  . Toe Injury    The history is provided by the patient. No language interpreter was used.     HPI Comments:  [name] is a 70 y.o. female who presents to the Emergency Department complaining of moderate pain to the left little toe s/p injury ~2 hours ago. Pt states her foot got caught in her vanity and caused her to fall; no LOC or head injury. She has an associated laceration to the toe. Pt has no other acute complaints or injuries at this time. Tetanus status is unknown.    Past Medical History:  Diagnosis Date  . Anginal pain (HCC)   . Arthritis    bilateral knees  . Chest pain   . Complication of anesthesia    see note about TIA  . Dizziness   . Head pain 07/25/15   recent blow to head  . Pericarditis 04/2015  . TIA (transient ischemic attack) 1988   occurred three days after anesthesia    Patient Active Problem List   Diagnosis Date Noted  . 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 Acute pericarditis 04/10/2014  . Chest pain 04/09/2014  . Vertigo 08/07/2013  . Head pain   . Concussion 05/11/2013  . Headache 05/11/2013  . OTHER CHEST PAIN 01/30/2010    Past Surgical History:  Procedure Laterality Date  . APPENDECTOMY    . CERVICAL CONE BIOPSY    . COLONOSCOPY    . CYSTOCELE REPAIR    . DILATATION & CURETTAGE/HYSTEROSCOPY WITH MYOSURE N/A 08/08/2015   Procedure: DILATATION & CURETTAGE/HYSTEROSCOPY WITH MYOSURE;  Surgeon: Maxie BetterSheronette Cousins, MD;  Location: [facility];  Service: Gynecology;  Laterality: N/A;  . TONSILLECTOMY    . TUBAL LIGATION      OB History    No data available        Home Medications    Prior to Admission medications   Medication Sig Start Date End Date Taking? Authorizing Provider  Ascorbic Acid (VITAMIN C) 1000 MG tablet Take 1,000 mg by mouth daily.    Historical Provider, MD  b complex vitamins tablet Take 1 tablet by mouth daily.    Historical Provider, MD  Biotin 1000 MCG tablet Take 1,000 mcg by mouth daily.    Historical Provider, MD  Capsicum, Cayenne, (CAYENNE PO) Take 1 capsule by mouth daily.     Historical Provider, MD  Carboxymethylcellul-Glycerin (OPTIVE) 0.5-0.9 % SOLN Apply to eye. One gtt each eye twice weekly    Historical Provider, MD  Cholecalciferol (VITAMIN D) 2000 units tablet Take 2,000 Units by mouth daily.    Historical Provider, MD  COCONUT OIL PO Take 15 mLs by mouth daily.    Historical Provider, MD  Grape Seed Extract 100 MG CAPS Take 1 capsule by mouth daily.    Historical Provider, MD  ibuprofen (ADVIL,MOTRIN) 200 MG tablet Take 200 mg by mouth every 6 (six) hours as needed for headache or moderate pain.    Historical Provider, MD  LECITHIN PO Take 10 g by mouth daily.     Historical Provider, MD  MAGNESIUM GLYCINATE PLUS PO Take 400 mg by mouth daily.  Historical Provider, MD  NON FORMULARY Take 450 mg by mouth daily. Patient takes fennel    Historical Provider, MD  Omega-3 Fatty Acids (OMEGA 3 PO) Take 715 mg by mouth daily.    Historical Provider, MD  OVER THE COUNTER MEDICATION Take 1 capsule by mouth daily. Patient takes Vegetal Silica    Historical Provider, MD  Probiotic Product (PROBIOTIC & ACIDOPHILUS EX ST PO) Take by mouth.    Historical Provider, MD  Specialty Vitamins Products (ONE-A-DAY BONE STRENGTH PO) Take 3 tablets by mouth daily.    Historical Provider, MD    Family History Family History  Problem Relation Age of Onset  . Cancer Mother   . Heart disease Father   . Heart failure Father   . Coronary artery disease      Social History Social History  Substance Use Topics  . Smoking  status: Never Smoker  . Smokeless tobacco: Never Used  . Alcohol use No     Allergies   Patient has no known allergies.   Review of Systems Review of Systems 10 systems reviewed and all are negative for acute change except as noted in the HPI.    Physical Exam Updated Vital Signs BP 145/83 (BP Location: Left Arm)   Pulse 74   Temp 97.9 F (36.6 C) (Oral)   Resp 18   Ht 5\' 6"  (1.676 m)   Wt 180 lb (81.6 kg)   SpO2 100%   BMI 29.05 kg/m   Physical Exam  Constitutional: She is oriented to person, place, and time. She appears well-developed and well-nourished. No distress.  HENT:  Head: Normocephalic and atraumatic.  Nose: Nose normal.  Mouth/Throat: Oropharynx is clear and moist. No oropharyngeal exudate.  Eyes: Conjunctivae and EOM are normal. Pupils are equal, round, and reactive to light. No scleral icterus.  Neck: Normal range of motion. Neck supple. No JVD present. No tracheal deviation present. No thyromegaly present.  Cardiovascular: Normal rate, regular rhythm and normal heart sounds.  Exam reveals no gallop and no friction rub.   No murmur heard. Pulmonary/Chest: Effort normal and breath sounds normal. No respiratory distress. She has no wheezes. She exhibits no tenderness.  Abdominal: Soft. Bowel sounds are normal. She exhibits no distension and no mass. There is no tenderness. There is no rebound and no guarding.  Musculoskeletal: She exhibits deformity.  Left foot 5th digit with obvious deformity of proximal metatarsal   Lymphadenopathy:    She has no cervical adenopathy.  Neurological: She is alert and oriented to person, place, and time. No cranial nerve deficit. She exhibits normal muscle tone.  Skin: Skin is warm and dry. No rash noted. No erythema. No pallor.  1.5 cm laceration between 4 and 5th toes of the left foot   Nursing note and vitals reviewed.    ED Treatments / Results  DIAGNOSTIC STUDIES:  Oxygen Saturation is 100% on RA, normal by my  interpretation.    COORDINATION OF CARE:  2:45 AM Discussed treatment plan with pt at bedside and pt agreed to plan.  Labs (all labs ordered are listed, but only abnormal results are displayed) Labs Reviewed - No data to display  EKG  EKG Interpretation None       Radiology Dg Foot Complete Left  Result Date: 04/29/2016 CLINICAL DATA:  Injury to the left fifth toe tonight. Pain, swelling, and bleeding. EXAM: LEFT FOOT - COMPLETE 3+ VIEW COMPARISON:  None. FINDINGS: There is a transverse fracture of the shaft of the  proximal phalanx of the left fifth toe. There is also a displaced ossicle over the medial aspect of the proximal phalanx at the articular surface. Margins appear poorly defined suggesting this is also in acute avulsion fracture. Soft tissue swelling and soft tissue gas consistent with laceration. An open fracture is not excluded. No other acute fractures demonstrated. Prominent calcification posterior to the distal tibia likely represents dystrophic calcification. IMPRESSION: Acute transverse fracture of the proximal phalanx of the left fifth toe. Additional fracture of the medial aspect base of fifth proximal phalanx extending to the articular surface. Soft tissue gas consistent with laceration but cannot exclude open fractures. Electronically Signed   By: Burman NievesWilliam  Stevens M.D.   On: 04/29/2016 01:09    Procedures .Marland Kitchen.Laceration Repair Date/Time: 04/29/2016 3:51 AM Performed by: Tomasita CrumbleNI, Ulrick Methot Authorized by: Tomasita CrumbleNI, Tran Arzuaga   Consent:    Consent obtained:  Verbal   Consent given by:  Patient Anesthesia (see MAR for exact dosages):    Anesthesia method:  Local infiltration   Local anesthetic:  Lidocaine 1% WITH epi Laceration details:    Location:  Toe   Length (cm):  1.5 Repair type:    Repair type:  Complex Exploration:    Hemostasis achieved with:  Epinephrine Treatment:    Wound cleansed with: Chloraprep.   Irrigation solution:  Sterile water   Irrigation volume:   100 Skin repair:    Repair method:  Sutures   Suture size:  5-0   Wound skin closure material used: Etilon.   Suture technique:  Simple interrupted   Number of sutures:  7 Post-procedure details:    Dressing:  Antibiotic ointment   Patient tolerance of procedure:  Tolerated well, no immediate complications    (including critical care time)  Medications Ordered in ED Medications  Tdap (BOOSTRIX) injection 0.5 mL (not administered)  lidocaine-EPINEPHrine (XYLOCAINE W/EPI) 1 %-1:100000 (with pres) injection 10 mL (not administered)  cephALEXin (KEFLEX) capsule 500 mg (not administered)     Initial Impression / Assessment and Plan / ED Course  I have reviewed the triage vital signs and the nursing notes.  Pertinent labs & imaging results that were available during my care of the patient were reviewed by me and considered in my medical decision making (see chart for details).  Clinical Course     Patient presents to the ED for toe injury.  There is a laceration that needs repair as well.  XR reveals a fractures of the prox phal.  Will update tetanus and buddy tape toes.  Ortho follow up advised.  Patient also given abx ppx for possible open fracture.  She appears well and in NAD.  VS remain within her normal limits and she is safe for DC.  Final Clinical Impressions(s) / ED Diagnoses   Final diagnoses:  None    New Prescriptions New Prescriptions   No medications on file     I personally performed the services described in this documentation, which was scribed in my presence. The recorded information has been reviewed and is accurate.       Tomasita CrumbleAdeleke Brithany Whitworth, MD 04/29/16 [phone]

## 2016-04-29 NOTE — ED Triage Notes (Signed)
The pt is c/o an injured lt little toe.  She was in  Her closet and her toe caught on the leg of a piece of furniture  Laceration and swelling with pain .  It occurred approx an hour ago  Bleeding controlled by a bandage

## 2016-08-02 ENCOUNTER — Other Ambulatory Visit: Payer: Medicare Other

## 2016-08-06 ENCOUNTER — Other Ambulatory Visit: Payer: Medicare Other

## 2016-08-13 ENCOUNTER — Other Ambulatory Visit: Payer: Medicare Other

## 2016-08-24 ENCOUNTER — Ambulatory Visit
Admission: RE | Admit: 2016-08-24 | Discharge: 2016-08-24 | Disposition: A | Discharge status: Home / Self Care | Payer: Medicare Other | Source: Ambulatory Visit | Attending: Nurse Practitioner | Admitting: Nurse Practitioner

## 2016-08-24 DIAGNOSIS — S060X0A Concussion without loss of consciousness, initial encounter: Secondary | ICD-10-CM

## 2016-08-24 DIAGNOSIS — R413 Other amnesia: Secondary | ICD-10-CM

## 2016-08-26 ENCOUNTER — Telehealth: Payer: Self-pay | Admitting: *Deleted

## 2016-08-26 NOTE — Telephone Encounter (Signed)
Per Joan Skeens Martin, NP, spoke with patient and informed her that her MRI brain result is normal. She verbalized understanding, appreciation and confirmed her follow up appointment in April.

## 2016-09-09 ENCOUNTER — Encounter: Payer: Self-pay | Admitting: Nurse Practitioner

## 2016-09-09 ENCOUNTER — Ambulatory Visit (INDEPENDENT_AMBULATORY_CARE_PROVIDER_SITE_OTHER): Payer: Medicare Other | Admitting: Nurse Practitioner

## 2016-09-09 VITALS — BP 118/73 | HR 75 | Wt 179.8 lb

## 2016-09-09 DIAGNOSIS — G44209 Tension-type headache, unspecified, not intractable: Secondary | ICD-10-CM

## 2016-09-09 DIAGNOSIS — S060X0D Concussion without loss of consciousness, subsequent encounter: Secondary | ICD-10-CM | POA: Diagnosis not present

## 2016-09-09 DIAGNOSIS — R413 Other amnesia: Secondary | ICD-10-CM

## 2016-09-09 NOTE — Progress Notes (Signed)
[facility]  PATIENT: [name] DOB: June 29, 1945   REASON FOR VISIT: Follow-up for history of concussion, memory loss HISTORY FROM: Patient     HISTORY OF PRESENT ILLNESS: HISTORY  [name] is a 71 years old right-handed African American female, accompanied by her husband, referred by her primary care physician Dr. Andi Devon for evaluation of left-sided headaches in March 2015. She denies a previous history of migraine headaches, was very healthy, in October 18th 2014, at the hotel, close to midnight, she was trying to sit down at a rolling chair in front of the mirror but she mis-positioned herself, fell backwards on the floor, landed on her occipital region at the bathtub, there was no loss of consciousness, but she developed occipital area headaches afterwards, rest of the night, she was afraid of going to the sleep, She was fine for 3 weeks, until early November 2014, she began to develop frequent, almost daily headaches, left parietal, left frontal, left retro-orbital region, 5 out of 7 days, the pain was moderate to severe, debilitating for her, lasting for a few hours, she has to take multiple doses of ibuprofen, she presented to the emergency room, CAT scan of the brain was normal, MRI of the brain was normal, She also complains of mild blurry vision, no gait difficulty, She is very hesitate about the potential side effect of the medication, she does not want to go on any preventive medications  UPDATE March 3rd 2015:YY She no longer has headaches, has been doing very well until 3 weeks ago, she woke up in the middle of the night using bathroom, felt sudden onset vertigo, gait difficulty, she was able to go back to bed, when she woke up next morning, she continued to felt vertigo, dizziness, unsteady gait, ever since the event, with sudden movement, she felt unsteadiness, no hearing loss, no tinnitus, no double vision,  UPDATE Aug 04 2015:YY Last  clinical visit was in March 2015, she has been doing well until July 25 2015, she bumped forcefully into a glass door, no loss of consciousness, but had persistent mild bilateral frontal headaches since the incident, overall has improved, she is planning on to have elective surgery under general anesthesia in August 08 2015, want to make sure she is fine to going through general anesthesia,  She also reported a "TIA"episode, when she presented with lateralized paresthesia, and transient weakness in 1988, she has no recollection of the detail anymore, we have personally reviewed MRI of the brain in November 2014, single isolated left periventricular gliosis. MRI a of the brain and neck showed no large vessel disease, UPDATE 03/04/2016 CM [name], 71 year old female returns for follow-up with her husband. She was last seen by Dr. Terrace Arabia in February after she bumped into a glass door forcefully no loss of consciousness but had persistent headaches which overall have improved she no longer complains with headaches however she does complain with memory loss , she has problems with details. She also complains with fatigue. She wants to have repeat MRI. She has a little understanding of the signs and symptoms of concussion. She returns for reevaluation UPDATE  04/05/2018CM  [name],  71 year old female returns for follow-up alone.  She has a history of concussion in February of last year when she ran into glass door no  loss of consciousness but had perisistent headaches which have improved. When last seen she was complaining with memory loss. MRI of the brain was just completed 08/22/2016.  It was normal.  She is driving without difficulty.  Overall she is doing much better,  Still occasionally has some word finding difficulty,  Or forgets simple things.  She returns for reevaluation   REVIEW OF SYSTEMS: Full 14 system review of systems performed and notable only for those listed, all others are neg:    Constitutional: Fatigue  Cardiovascular: neg Ear/Nose/Throat: neg  Skin: neg Eyes: neg Respiratory: neg Gastroitestinal: neg  Hematology/Lymphatic: neg Endocrine: Intolerance to cold Musculoskeletal:neg Allergy/Immunology: neg Neurological:  Periodic memory loss emory loss Psychiatric: neg Sleep : neg   ALLERGIES: No Known Allergies  HOME MEDICATIONS: Outpatient Medications Prior to Visit  Medication Sig Dispense Refill  . Ascorbic Acid (VITAMIN C) 1000 MG tablet Take 1,000 mg by mouth daily.    Marland Kitchen b complex vitamins tablet Take 1 tablet by mouth daily.    . Biotin 1000 MCG tablet Take 1,000 mcg by mouth daily.    . Capsicum, Cayenne, (CAYENNE PO) Take 1 capsule by mouth daily.     . Carboxymethylcellul-Glycerin (OPTIVE) 0.5-0.9 % SOLN Apply to eye. One gtt each eye twice weekly    . Cholecalciferol (VITAMIN D) 2000 units tablet Take 2,000 Units by mouth daily.    . COCONUT OIL PO Take 15 mLs by mouth daily.    . Grape Seed Extract 100 MG CAPS Take 1 capsule by mouth daily.    Marland Kitchen ibuprofen (ADVIL,MOTRIN) 200 MG tablet Take 200 mg by mouth every 6 (six) hours as needed for headache or moderate pain.    Marland Kitchen LECITHIN PO Take 10 g by mouth daily.     Marland Kitchen MAGNESIUM GLYCINATE PLUS PO Take 400 mg by mouth daily.     . NON FORMULARY Take 450 mg by mouth daily. Patient takes fennel    . Omega-3 Fatty Acids (OMEGA 3 PO) Take 715 mg by mouth daily.    Marland Kitchen OVER THE COUNTER MEDICATION Take 1 capsule by mouth daily. Patient takes Vegetal Silica    . Probiotic Product (PROBIOTIC & ACIDOPHILUS EX ST PO) Take by mouth.    Marland Kitchen Specialty Vitamins Products (ONE-A-DAY BONE STRENGTH PO) Take 3 tablets by mouth daily.    . cephALEXin (KEFLEX) 500 MG capsule Take 1 capsule (500 mg total) by mouth 2 (two) times daily. 6 capsule 0   No facility-administered medications prior to visit.     PAST MEDICAL HISTORY: Past Medical History:  Diagnosis Date  . Anginal pain (HCC)   . Arthritis    bilateral knees   . Chest pain   . Complication of anesthesia    see note about TIA  . Dizziness   . Head pain 07/25/15   recent blow to head  . Pericarditis 04/2015  . TIA (transient ischemic attack) 1988   occurred three days after anesthesia    PAST SURGICAL HISTORY: Past Surgical History:  Procedure Laterality Date  . APPENDECTOMY    . CERVICAL CONE BIOPSY    . COLONOSCOPY    . CYSTOCELE REPAIR    . DILATATION & CURETTAGE/HYSTEROSCOPY WITH MYOSURE N/A 08/08/2015   Procedure: DILATATION & CURETTAGE/HYSTEROSCOPY WITH MYOSURE;  Surgeon: Maxie Better, MD;  Location: [facility];  Service: Gynecology;  Laterality: N/A;  . TONSILLECTOMY    . TUBAL LIGATION      FAMILY HISTORY: Family History  Problem Relation Age of Onset  . Cancer Mother   . Heart disease Father   . Heart failure Father   . Coronary artery disease      SOCIAL HISTORY: Social History  Social History  . Marital status: Married    Spouse name: Athelstan  . Number of children: 6  . Years of education: 24   Occupational History  . Not on file.   Social History Main Topics  . Smoking status: Never Smoker  . Smokeless tobacco: Never Used  . Alcohol use No  . Drug use: No  . Sexual activity: Yes    Birth control/ protection: Post-menopausal   Other Topics Concern  . Not on file   Social History Narrative   Patient is married Conservation officer, historic buildings) and lives at home with her husband and her daughter.   Patient has five living children and one is deceased.   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   Patient is right handed.   Patient drinks very little caffeine.     PHYSICAL EXAM  Vitals:   09/09/16 1411  BP: 118/73  Pulse: 75  Weight: 179 lb 12.8 oz (81.6 kg)   Body mass index is 29.02 kg/m.  Generalized: Well developed, in no acute distress , well-groomed Head: normocephalic and atraumatic,. Oropharynx benign  Neck: Supple, no carotid bruits  Cardiac: Regular rate rhythm, no murmur   Musculoskeletal: No deformity   Neurological examination   Mentation: Alert oriented to time, place, history taking. MOCA 22/30.at last visit not repeated Follows all commands speech and language fluent.   Cranial nerve II-XII: Fundoscopic exam reveals sharp disc margins.Pupils were equal round reactive to light extraocular movements were full, visual field were full on confrontational test. Facial sensation and strength were normal. hearing was intact to finger rubbing bilaterally. Uvula tongue midline. head turning and shoulder shrug were normal and symmetric.Tongue protrusion into cheek strength was normal. Motor: normal bulk and tone, full strength in the BUE, BLE, fine finger movements normal, no pronator drift. No focal weakness  Sensory: normal and symmetric to light touch, pinprick, and  Vibration, in the upper and lower extremities Coordination: finger-nose-finger, heel-to-shin bilaterally, no dysmetria Reflexes: Brachioradialis 2/2, biceps 2/2, triceps 2/2, patellar 2/2, Achilles 2/2, plantar responses were flexor bilaterally. Gait and Station: Rising up from seated position without assistance, normal stance,  moderate stride, good arm swing, smooth turning, able to perform tiptoe, and heel walking without difficulty. Tandem gait is steady  DIAGNOSTIC DATA (LABS, IMAGING, TESTING)       Component Value Date/Time   NA 138 04/02/2015 1243   K 3.6 04/02/2015 1243   CL 104 04/02/2015 1243   CO2 22 04/02/2015 1243   GLUCOSE 91 04/02/2015 1243   BUN 10 04/02/2015 1243   CREATININE 0.80 04/02/2015 1243   CALCIUM 9.9 04/02/2015 1243   PROT 6.9 04/09/2014 0835   ALBUMIN 3.4 (L) 04/09/2014 0835   AST 21 04/09/2014 0835   ALT 11 04/09/2014 0835   ALKPHOS 69 04/09/2014 0835   BILITOT 0.4 04/09/2014 0835   GFRNONAA >60 04/02/2015 1243   GFRAA >60 04/02/2015 1243    ASSESSMENT AND PLAN  71 y.o. year old female  has a past medical history of  Dizziness; Head pain (07/25/15);  Pericarditis (04/2015); and TIA (transient ischemic attack) (1988). And concussion here to follow-up. She is complaining with some mild memory loss.  MRI of the brain 08/22/2016 was normal.    Given information on memory stimulating activities Reviewed results of  MRI of the brain with patient Follow-up in 6 months next with Dr. Terrace Arabia, if stable will dismiss at that time Nilda Riggs, [facility], [facility], APRN  [facility]  [address], [place], [place] [address] [phone]

## 2016-09-09 NOTE — Patient Instructions (Signed)
Given information on memory stimulating activities Given results of  MRI of the brain Follow-up in 6 months next with Dr. Terrace Arabia

## 2016-09-10 NOTE — Progress Notes (Signed)
I have reviewed and agreed above plan. 

## 2016-12-22 ENCOUNTER — Emergency Department (HOSPITAL_COMMUNITY)
Admission: EM | Admit: 2016-12-22 | Discharge: 2016-12-22 | Disposition: A | Discharge status: Home / Self Care | Payer: Medicare Other | Attending: Emergency Medicine | Admitting: Emergency Medicine

## 2016-12-22 ENCOUNTER — Encounter (HOSPITAL_COMMUNITY): Payer: Self-pay | Admitting: *Deleted

## 2016-12-22 DIAGNOSIS — L03116 Cellulitis of left lower limb: Secondary | ICD-10-CM

## 2016-12-22 DIAGNOSIS — W57XXXA Bitten or stung by nonvenomous insect and other nonvenomous arthropods, initial encounter: Secondary | ICD-10-CM | POA: Insufficient documentation

## 2016-12-22 DIAGNOSIS — Z79899 Other long term (current) drug therapy: Secondary | ICD-10-CM | POA: Insufficient documentation

## 2016-12-22 DIAGNOSIS — Y999 Unspecified external cause status: Secondary | ICD-10-CM | POA: Diagnosis not present

## 2016-12-22 DIAGNOSIS — M79652 Pain in left thigh: Secondary | ICD-10-CM | POA: Diagnosis present

## 2016-12-22 DIAGNOSIS — Y929 Unspecified place or not applicable: Secondary | ICD-10-CM | POA: Diagnosis not present

## 2016-12-22 DIAGNOSIS — Z8673 Personal history of transient ischemic attack (TIA), and cerebral infarction without residual deficits: Secondary | ICD-10-CM | POA: Diagnosis not present

## 2016-12-22 DIAGNOSIS — Y9389 Activity, other specified: Secondary | ICD-10-CM | POA: Insufficient documentation

## 2016-12-22 DIAGNOSIS — T63441A Toxic effect of venom of bees, accidental (unintentional), initial encounter: Secondary | ICD-10-CM | POA: Diagnosis not present

## 2016-12-22 MED ORDER — CLINDAMYCIN PHOSPHATE 900 MG/50ML IV SOLN
900.0000 mg | Freq: Once | INTRAVENOUS | Status: AC
  Administered 2016-12-22: 900 mg via INTRAVENOUS
  Filled 2016-12-22: qty 50

## 2016-12-22 MED ORDER — CLINDAMYCIN HCL 150 MG PO CAPS: 450.0000 mg | ORAL_CAPSULE | Freq: Three times a day (TID) | ORAL | 0 refills | Status: DC

## 2016-12-22 NOTE — ED Provider Notes (Signed)
[facility]-EMERGENCY DEPT Provider Note   CSN: [id number] Arrival date & time: 12/22/16  1647     History   Chief Complaint Chief Complaint  Patient presents with  . Insect Bite    HPI [name] is a 71 y.o. female.  HPI Patient presents to the emergency department with a bee sting to the left medial thigh.  Patient states scarred on Monday.  She immediately placed ice on the area alone with taking Benadryl.  She states it seemed to improve and then yesterday evening.  The area red, warm and tender.  She states that today it looked worse and so she came to the emergency department for further evaluation.  The patient states that she tried grabbing baking soda and water mixture over the area.  Patient states that nothing seems to make the condition better or worseThe patient denies chest pain, shortness of breath, headache,blurred vision, neck pain, fever, cough, weakness, numbness, dizziness, anorexia, edema, abdominal pain, nausea, vomiting, diarrhea, rash, back pain, dysuria, hematemesis, bloody stool, near syncope, or syncope. Past Medical History:  Diagnosis Date  . Anginal pain (HCC)   . Arthritis    bilateral knees  . Chest pain   . Complication of anesthesia    see note about TIA  . Dizziness   . Head pain 07/25/15   recent blow to head  . Pericarditis 04/2015  . TIA (transient ischemic attack) 1988   occurred three days after anesthesia    Patient Active Problem List   Diagnosis Date Noted  . 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 Acute pericarditis 04/10/2014  . Chest pain 04/09/2014  . Vertigo 08/07/2013  . Head pain   . Concussion 05/11/2013  . Headache 05/11/2013  . OTHER CHEST PAIN 01/30/2010    Past Surgical History:  Procedure Laterality Date  . APPENDECTOMY    . CERVICAL CONE BIOPSY    . COLONOSCOPY    . CYSTOCELE REPAIR    . DILATATION & CURETTAGE/HYSTEROSCOPY WITH MYOSURE N/A 08/08/2015   Procedure: DILATATION & CURETTAGE/HYSTEROSCOPY WITH MYOSURE;  Surgeon:  Maxie Better, MD;  Location: [facility];  Service: Gynecology;  Laterality: N/A;  . TONSILLECTOMY    . TUBAL LIGATION      OB History    No data available       Home Medications    Prior to Admission medications   Medication Sig Start Date End Date Taking? Authorizing Provider  Ascorbic Acid (VITAMIN C) 1000 MG tablet Take 1,000 mg by mouth daily.   Yes [provider]  b complex vitamins tablet Take 1 tablet by mouth daily.   Yes [provider]  Biotin 1000 MCG tablet Take 1,000 mcg by mouth daily.   Yes [provider]  Capsicum, Cayenne, (CAYENNE PO) Take 1 capsule by mouth daily.    Yes [provider]  Cholecalciferol (VITAMIN D) 2000 units tablet Take 2,000 Units by mouth daily.   Yes [provider]  COCONUT OIL PO Take 15 mLs by mouth daily.   Yes [provider]  GARLIC PO Take 1 tablet by mouth 4 (four) times a week.   Yes [provider]  Glucosamine HCl (GLUCOSAMINE PO) Take 3 capsules by mouth daily.   Yes [provider]  MAGNESIUM GLYCINATE PLUS PO Take 400 mg by mouth daily.    Yes [provider]  NON FORMULARY Take 450 mg by mouth daily. Patient takes fennel   Yes [provider]  OVER THE COUNTER MEDICATION Take 1  capsule by mouth daily. Patient takes Vegetal Silica   Yes [provider]  Polyvinyl Alcohol-Povidone (REFRESH OP) Apply 1 drop to eye daily as needed (dry eyes).   Yes [provider]  Probiotic Product (PROBIOTIC & ACIDOPHILUS EX ST PO) Take 1 tablet by mouth daily.    Yes [provider]  Protein POWD Take 1 scoop by mouth daily. Mix with water   Yes [provider]  Specialty Vitamins Products (ONE-A-DAY BONE STRENGTH PO) Take 3 tablets by mouth daily.   Yes [provider]  SPIRULINA PO Take 3 capsules by mouth daily.   Yes [provider]  Turmeric POWD Take 5 mLs by mouth daily.   Yes [provider]  LECITHIN PO Take 10 g by mouth daily.     [provider]    Family History Family History  Problem Relation Age of Onset  . Cancer Mother   . Heart disease Father   . Heart failure Father   . Coronary artery disease Unknown     Social History Social History  Substance Use Topics  . Smoking status: Never Smoker  . Smokeless tobacco: Never Used  . Alcohol use No     Allergies   Patient has no known allergies.   Review of Systems Review of Systems All other systems negative except as documented in the HPI. All pertinent positives and negatives as reviewed in the HPI. Physical Exam Updated Vital Signs BP 127/84   Pulse 68   Temp 98.1 F (36.7 C) (Oral)   Resp 18   Ht 5\' 6"  (1.676 m)   Wt 81.6 kg (180 lb)   SpO2 100%   BMI 29.05 kg/m   Physical Exam  Constitutional: She is oriented to person, place, and time. She appears well-developed and well-nourished. No distress.  HENT:  Head: Normocephalic and atraumatic.  Eyes: Pupils are equal, round, and reactive to light.  Pulmonary/Chest: Effort normal.  Neurological: She is alert and oriented to person, place, and time.  Skin: Skin is warm and dry.     Psychiatric: She has a normal mood and affect.  Nursing note and vitals reviewed.    ED Treatments / Results  Labs (all labs ordered are listed, but only abnormal results are displayed) Labs Reviewed - No data to display  EKG  EKG Interpretation None       Radiology No results found.  Procedures Procedures (including critical care time)  Medications Ordered in ED Medications  clindamycin (CLEOCIN) IVPB 900 mg (900 mg Intravenous New Bag/Given 12/22/16 2144)     Initial Impression / Assessment and Plan / ED Course  I have reviewed the triage vital signs and the nursing notes.  Pertinent labs & imaging results that were available during my care of the patient were reviewed by me and considered in my medical decision making  (see chart for details).     At this point this is most likely a cellulitis that has developed as a result of the bee sting.  Patient is advised to follow up with her primary doctor in the next 48 hours.  Told to return for any worsening in her condition.  Patient is given signs and symptoms to return for the patient agrees the plan.  All questions were answered.  I did advise her to be some heat around the area as well Final Clinical Impressions(s) / ED Diagnoses   Final diagnoses:  None    New Prescriptions New Prescriptions  No medications on file     Kyra MangesLawyer, Carloyn Lahue, PA-C 12/22/16 2225    Benjiman CorePickering, Nathan, MD 12/23/16 [phone]

## 2016-12-22 NOTE — ED Triage Notes (Addendum)
Pt reports being stung by a wasp on Monday night, on left leg. Has large red area to her leg, reports warmness to area, mild pain and itching. No relief with benadryl. No acute distress is noted at triage.

## 2016-12-22 NOTE — Discharge Instructions (Signed)
Return here for any worsening in her condition.  Follow-up with your primary doctor for a recheck.  Warm compresses over the area

## 2017-03-14 ENCOUNTER — Ambulatory Visit: Payer: Medicare Other | Admitting: Neurology

## 2017-03-14 ENCOUNTER — Telehealth: Payer: Self-pay | Admitting: *Deleted

## 2017-03-14 NOTE — Telephone Encounter (Signed)
No showed follow up appointment. 

## 2017-03-15 ENCOUNTER — Encounter: Payer: Self-pay | Admitting: Neurology

## 2017-03-28 IMAGING — CR DG CHEST 2V
2 series · 2 of 2 positions shown · non-contrast
Comparison: 04/09/2014 and prior chest radiographs dating back to
05/05/2006

CLINICAL DATA: 69-year-old female with acute shortness of breath
and chest pain for several days.

EXAM:
CHEST  2 VIEW

[chest pa]
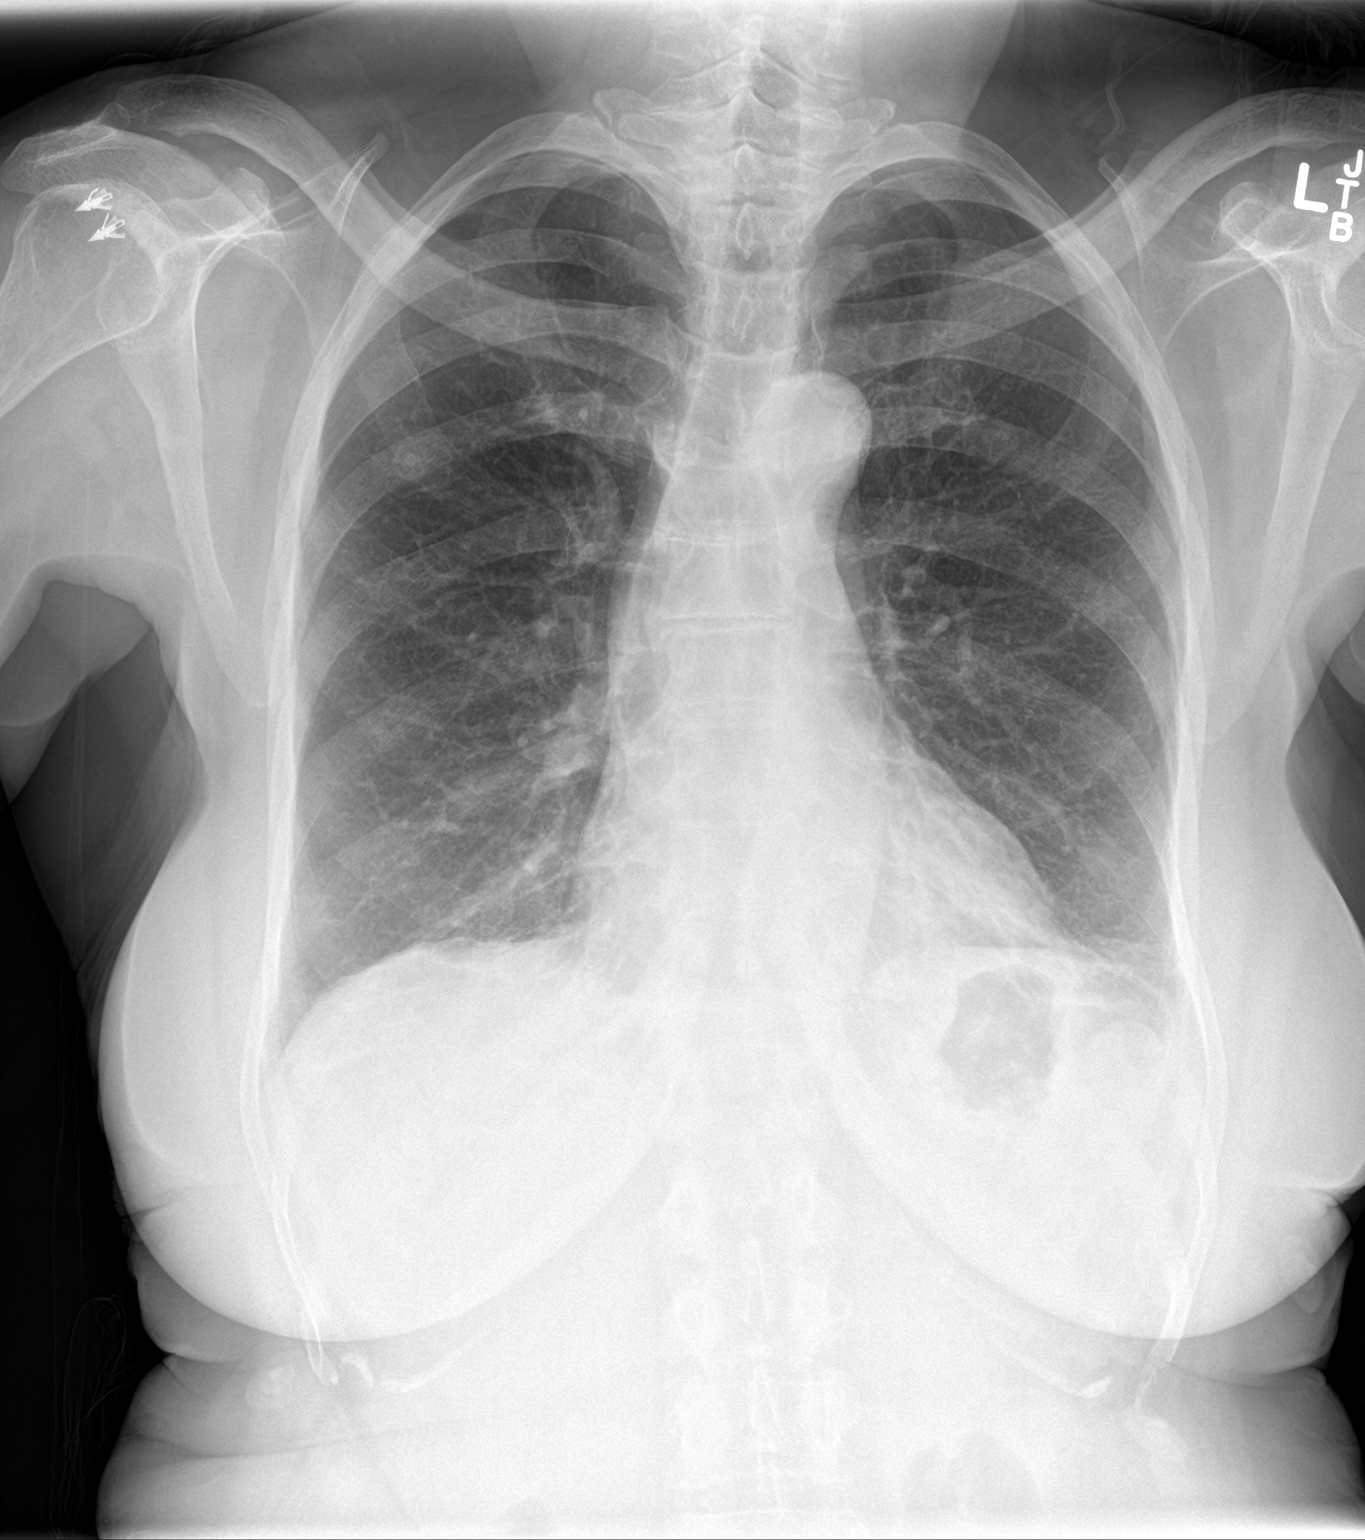

[chest lat]
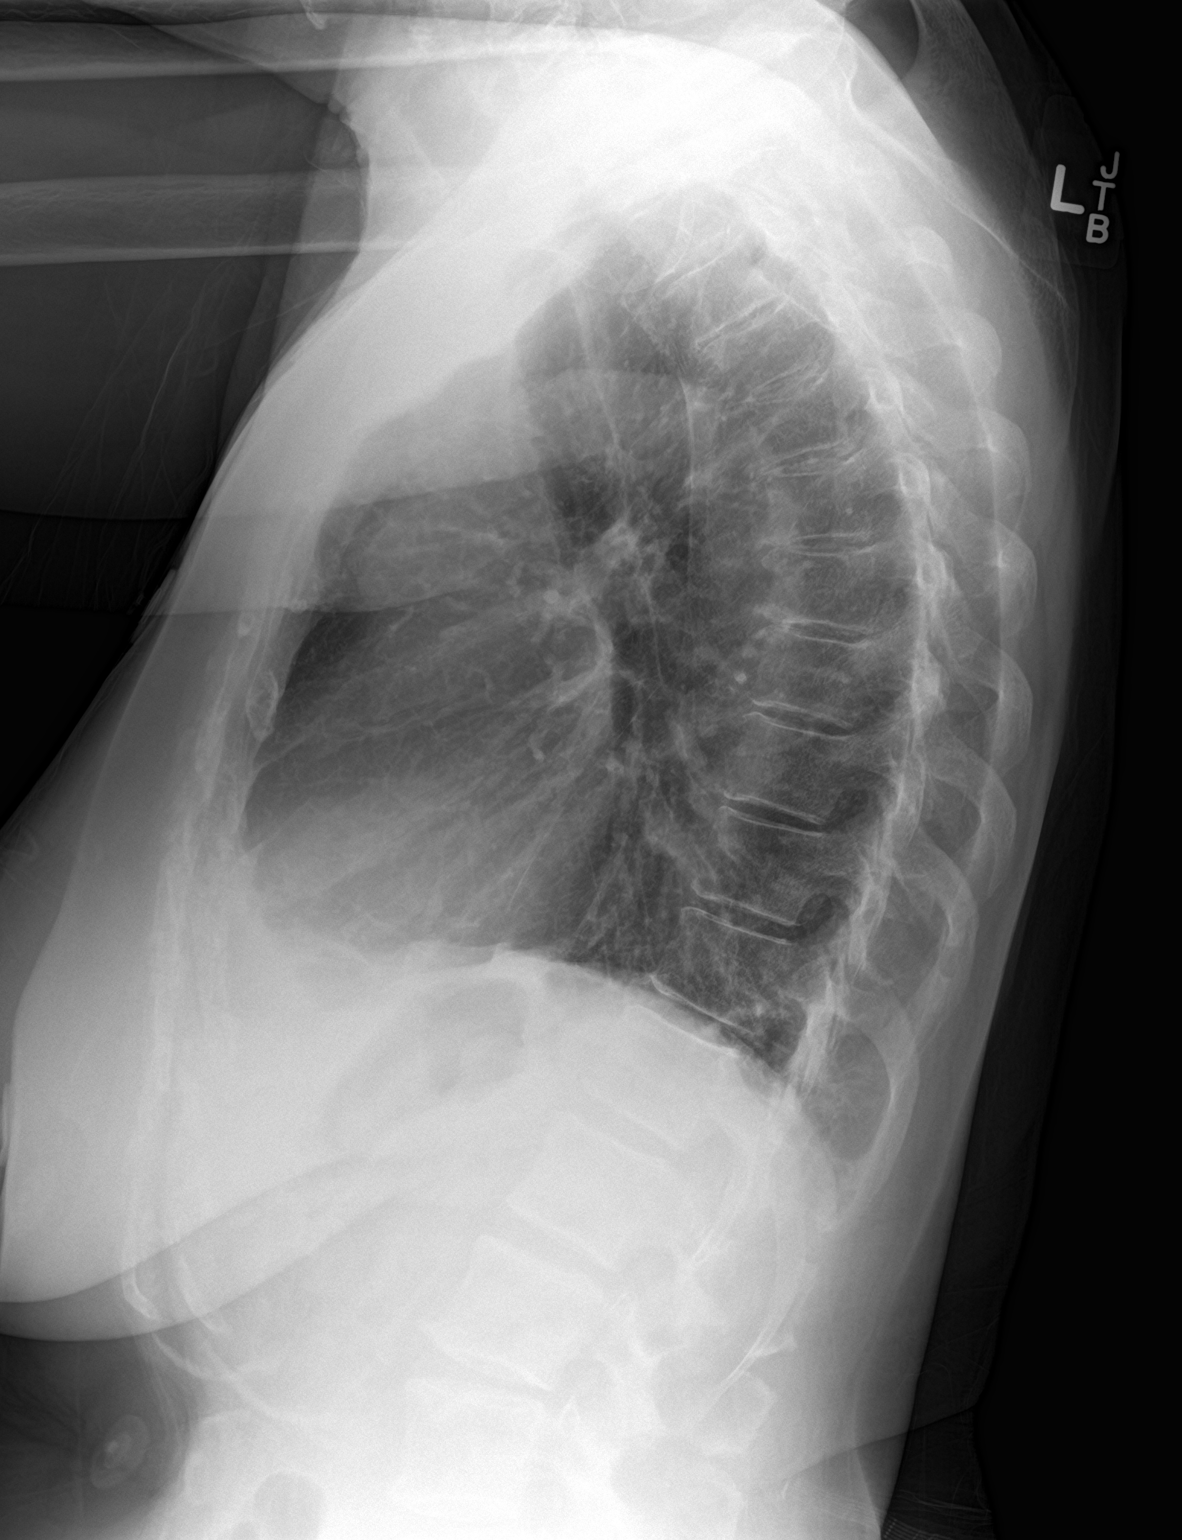

[2 of 2 positions shown; findings below may reference images not displayed]

FINDINGS: The cardiomediastinal silhouette is unremarkable.

Minimal basilar atelectasis/scarring noted.

There is no evidence of focal airspace disease, pulmonary edema,
suspicious pulmonary nodule/mass, pleural effusion, or pneumothorax.

No acute bony abnormalities are identified.

Right shoulder surgical changes noted.
IMPRESSION: Minimal bibasilar atelectasis/ scarring without other significant
abnormality.

## 2017-05-26 ENCOUNTER — Other Ambulatory Visit: Payer: Self-pay | Admitting: Internal Medicine

## 2017-05-26 DIAGNOSIS — Z1231 Encounter for screening mammogram for malignant neoplasm of breast: Secondary | ICD-10-CM

## 2017-05-26 DIAGNOSIS — E2839 Other primary ovarian failure: Secondary | ICD-10-CM

## 2017-06-29 ENCOUNTER — Ambulatory Visit
Admission: RE | Admit: 2017-06-29 | Discharge: 2017-06-29 | Disposition: A | Discharge status: Home / Self Care | Payer: Medicare Other | Source: Ambulatory Visit | Attending: Internal Medicine | Admitting: Internal Medicine

## 2017-06-29 DIAGNOSIS — E2839 Other primary ovarian failure: Secondary | ICD-10-CM

## 2017-06-29 DIAGNOSIS — Z1231 Encounter for screening mammogram for malignant neoplasm of breast: Secondary | ICD-10-CM

## 2017-12-30 ENCOUNTER — Other Ambulatory Visit: Payer: Self-pay | Admitting: Obstetrics and Gynecology

## 2018-01-21 ENCOUNTER — Encounter (HOSPITAL_COMMUNITY): Payer: Self-pay | Admitting: Emergency Medicine

## 2018-01-21 ENCOUNTER — Emergency Department (HOSPITAL_COMMUNITY): Payer: Medicare Other

## 2018-01-21 ENCOUNTER — Emergency Department (HOSPITAL_COMMUNITY)
Admission: EM | Admit: 2018-01-21 | Discharge: 2018-01-21 | Disposition: A | Discharge status: Home / Self Care | Payer: Medicare Other | Attending: Emergency Medicine | Admitting: Emergency Medicine

## 2018-01-21 ENCOUNTER — Other Ambulatory Visit: Payer: Self-pay

## 2018-01-21 DIAGNOSIS — X58XXXA Exposure to other specified factors, initial encounter: Secondary | ICD-10-CM | POA: Diagnosis not present

## 2018-01-21 DIAGNOSIS — Y939 Activity, unspecified: Secondary | ICD-10-CM | POA: Insufficient documentation

## 2018-01-21 DIAGNOSIS — Y929 Unspecified place or not applicable: Secondary | ICD-10-CM | POA: Diagnosis not present

## 2018-01-21 DIAGNOSIS — Z79899 Other long term (current) drug therapy: Secondary | ICD-10-CM | POA: Insufficient documentation

## 2018-01-21 DIAGNOSIS — S0990XA Unspecified injury of head, initial encounter: Secondary | ICD-10-CM | POA: Diagnosis present

## 2018-01-21 DIAGNOSIS — Y999 Unspecified external cause status: Secondary | ICD-10-CM | POA: Insufficient documentation

## 2018-01-21 DIAGNOSIS — S060X0A Concussion without loss of consciousness, initial encounter: Secondary | ICD-10-CM

## 2018-01-21 NOTE — ED Notes (Signed)
D/c reviewed with patient and spouse 

## 2018-01-21 NOTE — ED Triage Notes (Signed)
Pt reports being on a swing that broke 8 days ago, causing her to fall back and hit posterior head.  Pt reports HA, dizziness, "feeling fuzzy," since fall, denies LOC, n/v.  Pt denies use of blood thinners, reports hx concussion. AOx4.

## 2018-01-21 NOTE — ED Notes (Signed)
Patient ambulated independently to the bathroom without incident 

## 2018-01-21 NOTE — ED Notes (Signed)
Called once for triage- no answer.  

## 2018-01-21 NOTE — Discharge Instructions (Addendum)
If you were given medicines take as directed.  If you are on coumadin or contraceptives realize their levels and effectiveness is altered by many different medicines.  If you have any reaction (rash, tongues swelling, other) to the medicines stop taking and see a physician.    If your blood pressure was elevated in the ER make sure you follow up for management with a primary doctor or return for chest pain, shortness of breath or stroke symptoms.  Please follow up as directed and return to the ER or see a physician for new or worsening symptoms.  Thank you. Vitals:   01/21/18 1707 01/21/18 1745 01/21/18 1800 01/21/18 1815  BP:  129/70 124/78 124/69  Pulse: (!) 57 66 62   Resp: 18 15 14 14   Temp:      TempSrc:      SpO2: 100% 99% 99%

## 2018-01-21 NOTE — ED Provider Notes (Signed)
[facility] EMERGENCY DEPARTMENT Provider Note   CSN: [id number] Arrival date & time: 01/21/18  1353     History   Chief Complaint Chief Complaint  Patient presents with  . Fall  . Dizziness    HPI [name] is a 72 y.o. female.  Patient with history of concussion presents with fatigue, dizziness and mild headache is persistent for approximately 7 days.  Patient did have a head injury 8 days ago without loss of consciousness.  No focal neurologic complaints.  Patient denies chest pain or shortness of breath no blood in the stools.  Patient feels well otherwise.     Past Medical History:  Diagnosis Date  . Anginal pain (HCC)   . Arthritis    bilateral knees  . Chest pain   . Complication of anesthesia    see note about TIA  . Dizziness   . Head pain 07/25/15   recent blow to head  . Pericarditis 04/2015  . TIA (transient ischemic attack) 1988   occurred three days after anesthesia    Patient Active Problem List   Diagnosis Date Noted  . 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 Acute pericarditis 04/10/2014  . Chest pain 04/09/2014  . Vertigo 08/07/2013  . Head pain   . Concussion 05/11/2013  . Headache 05/11/2013  . OTHER CHEST PAIN 01/30/2010    Past Surgical History:  Procedure Laterality Date  . APPENDECTOMY    . CERVICAL CONE BIOPSY    . COLONOSCOPY    . CYSTOCELE REPAIR    . DILATATION & CURETTAGE/HYSTEROSCOPY WITH MYOSURE N/A 08/08/2015   Procedure: DILATATION & CURETTAGE/HYSTEROSCOPY WITH MYOSURE;  Surgeon: Maxie Better, MD;  Location: [facility];  Service: Gynecology;  Laterality: N/A;  . TONSILLECTOMY    . TUBAL LIGATION       OB History   None      Home Medications    Prior to Admission medications   Medication Sig Start Date End Date Taking? Authorizing Provider  Ascorbic Acid (VITAMIN C) 1000 MG tablet Take 1,000 mg by mouth daily.    [provider]  b complex vitamins tablet Take 1 tablet by mouth daily.     [provider]  Biotin 1000 MCG tablet Take 1,000 mcg by mouth daily.    [provider]  Capsicum, Cayenne, (CAYENNE PO) Take 1 capsule by mouth daily.     [provider]  Cholecalciferol (VITAMIN D) 2000 units tablet Take 2,000 Units by mouth daily.    [provider]  clindamycin (CLEOCIN) 150 MG capsule Take 3 capsules (450 mg total) by mouth 3 (three) times daily. 12/22/16   Lawyer, Cristal Deer, PA-C  COCONUT OIL PO Take 15 mLs by mouth daily.    [provider]  GARLIC PO Take 1 tablet by mouth 4 (four) times a week.    [provider]  Glucosamine HCl (GLUCOSAMINE PO) Take 3 capsules by mouth daily.    [provider]  LECITHIN PO Take 10 g by mouth daily.     [provider]  MAGNESIUM GLYCINATE PLUS PO Take 400 mg by mouth daily.     [provider]  NON FORMULARY Take 450 mg by mouth daily. Patient takes fennel    [provider]  OVER THE COUNTER MEDICATION Take 1 capsule by mouth daily. Patient takes Vegetal Silica    [provider]  Polyvinyl Alcohol-Povidone (REFRESH OP) Apply 1 drop to eye daily as needed (dry eyes).  [provider]  Probiotic Product (PROBIOTIC & ACIDOPHILUS EX ST PO) Take 1 tablet by mouth daily.     [provider]  Protein POWD Take 1 scoop by mouth daily. Mix with water    [provider]  Specialty Vitamins Products (ONE-A-DAY BONE STRENGTH PO) Take 3 tablets by mouth daily.    [provider]  SPIRULINA PO Take 3 capsules by mouth daily.    [provider]  Turmeric POWD Take 5 mLs by mouth daily.    [provider]  pantoprazole (PROTONIX) 40 MG tablet Take 1 tablet (40 mg total) by mouth daily. Patient not taking: Reported on 04/02/2015 04/10/14 04/02/15  Zannie CoveJoseph, Preetha, MD    Family History Family History  Problem Relation Age of Onset  . Cancer Mother   . Heart disease Father   . Heart  failure Father   . Coronary artery disease Unknown     Social History Social History   Tobacco Use  . Smoking status: Never Smoker  . Smokeless tobacco: Never Used  Substance Use Topics  . Alcohol use: No  . Drug use: No     Allergies   Patient has no known allergies.   Review of Systems Review of Systems  Constitutional: Negative for chills and fever.  HENT: Negative for congestion.   Eyes: Negative for visual disturbance.  Respiratory: Negative for shortness of breath.   Cardiovascular: Negative for chest pain.  Gastrointestinal: Negative for abdominal pain and vomiting.  Genitourinary: Negative for dysuria and flank pain.  Musculoskeletal: Negative for back pain, neck pain and neck stiffness.  Skin: Negative for rash.  Neurological: Positive for dizziness and headaches. Negative for light-headedness.     Physical Exam Updated Vital Signs BP 124/69   Pulse 62   Temp 97.8 F (36.6 C) (Oral)   Resp 14   SpO2 99%   Physical Exam  Constitutional: She is oriented to person, place, and time. She appears well-developed and well-nourished.  HENT:  Head: Normocephalic and atraumatic.  Eyes: Conjunctivae are normal. Right eye exhibits no discharge. Left eye exhibits no discharge.  Neck: Normal range of motion. Neck supple. No tracheal deviation present.  Cardiovascular: Normal rate.  Pulmonary/Chest: Effort normal.  Abdominal: Soft. She exhibits no distension. There is no tenderness. There is no guarding.  Musculoskeletal: She exhibits no edema.  Neurological: She is alert and oriented to person, place, and time.  5+ strength in UE and LE with f/e at major joints. Sensation to palpation intact in UE and LE. CNs 2-12 grossly intact.  EOMFI.  PERRL.   Finger nose and coordination intact bilateral.    No nystagmus   Skin: Skin is warm. No rash noted.  Psychiatric: She has a normal mood and affect.  Nursing note and vitals reviewed.    ED Treatments / Results    Labs (all labs ordered are listed, but only abnormal results are displayed) Labs Reviewed - No data to display  EKG None  Radiology Ct Head Wo Contrast  Result Date: 01/21/2018 CLINICAL DATA:  72 year old female with continued headache and dizziness following head injury from fall 8 days ago. Initial encounter. EXAM: CT HEAD WITHOUT CONTRAST TECHNIQUE: Contiguous axial images were obtained from the base of the skull through the vertex without intravenous contrast. COMPARISON:  08/24/2016 MR and 04/12/2013 CT FINDINGS: Brain: No evidence of acute infarction, hemorrhage, hydrocephalus, extra-axial collection or mass lesion/mass effect. Atrophy and mild chronic small-vessel white matter ischemic changes again noted. Vascular: Carotid  atherosclerotic calcifications noted. Skull: No acute abnormality Sinuses/Orbits: No acute finding. Other: None. IMPRESSION: 1. No evidence of acute intracranial abnormality. 2. Atrophy and mild chronic small-vessel white matter ischemic changes. Electronically Signed   By: Harmon PierJeffrey  Hu M.D.   On: 01/21/2018 18:40    Procedures Procedures (including critical care time)  Medications Ordered in ED Medications - No data to display   Initial Impression / Assessment and Plan / ED Course  I have reviewed the triage vital signs and the nursing notes.  Pertinent labs & imaging results that were available during my care of the patient were reviewed by me and considered in my medical decision making (see chart for details).    Patient presents with clinically concussion symptoms however with age and persistent symptoms CT scan head obtained to ensure no occult bleed.  Patient has normal neurologic exam well-appearing the ER no vomiting.  Discussed supportive care CT scan results reviewed negative.  Results and differential diagnosis were discussed with the patient/parent/guardian. Xrays were independently reviewed by myself.  Close follow up outpatient was discussed,  comfortable with the plan.   Medications - No data to display  Vitals:   01/21/18 1707 01/21/18 1745 01/21/18 1800 01/21/18 1815  BP:  129/70 124/78 124/69  Pulse: (!) 57 66 62   Resp: 18 15 14 14   Temp:      TempSrc:      SpO2: 100% 99% 99%     Final diagnoses:  Concussion without loss of consciousness, initial encounter     Final Clinical Impressions(s) / ED Diagnoses   Final diagnoses:  Concussion without loss of consciousness, initial encounter    ED Discharge Orders    None       Blane OharaZavitz, Taquisha Phung, MD 01/22/18 1524

## 2018-01-21 NOTE — ED Notes (Signed)
Patient transported to CT 

## 2018-01-24 ENCOUNTER — Telehealth: Payer: Self-pay | Admitting: Neurology

## 2018-01-24 NOTE — Telephone Encounter (Signed)
Returned call to patient - states she was instructed to allow her brain to rest.  Reviewed with her that this is best done by trying to avoid electronics and other cognitive challenging activities.  Also, she would benefit from getting physical rest.  She is using OTC NSAIDS sparingly for a headache.    She has history of concussion and verbalized understanding of the above.  She will keep her appt on 01/30/18 for evaluation by Dr. Terrace ArabiaYan.  She was last seen here in 09/2016 and no showed her follow up.  She was recently evaluated for this concussion on 01/21/18 at outpatient ED.  She will call them back if she requires immediate care prior to her appt here.

## 2018-01-24 NOTE — Telephone Encounter (Signed)
Pt was sitting in swing about 2 weeks ago and the fabric gave away causing her to fall backward hitting her head on the iron frame. She has been experiencing pain in the head, light headedness, shooting pain and pain over the right eye. He husband took her to Outpt ED on 01/21/18. She is wanting to f/u with Dr Terrace ArabiaYan since these are similar symptoms she had with previous concussion. An appt has been scheduled for 10/29 @ 7:30. She is wanting to be seen sooner if possible  Please call to advise

## 2018-01-24 NOTE — Telephone Encounter (Signed)
Spoke to patient - her appt has been moved to 01/30/18.

## 2018-01-24 NOTE — Telephone Encounter (Signed)
Pt called stating she forgot to mention a few things to RN, and would like to know if there is anything she can do in the time between now and her appt. Please call to advise

## 2018-01-30 ENCOUNTER — Ambulatory Visit: Payer: Medicare Other | Admitting: Neurology

## 2018-01-30 ENCOUNTER — Encounter: Payer: Self-pay | Admitting: Neurology

## 2018-01-30 VITALS — BP 123/81 | HR 65 | Ht 66.0 in | Wt 180.0 lb

## 2018-01-30 DIAGNOSIS — R51 Headache: Secondary | ICD-10-CM

## 2018-01-30 DIAGNOSIS — G8929 Other chronic pain: Secondary | ICD-10-CM

## 2018-01-30 DIAGNOSIS — R519 Headache, unspecified: Secondary | ICD-10-CM

## 2018-01-30 NOTE — Patient Instructions (Addendum)
Magnesium oxide $RemoveBefor [id number]$[id number]$100mgraine as needed

## 2018-01-30 NOTE — Progress Notes (Signed)
[facility]  PATIENT: [name] DOB: 1946-02-22  HISTORY OF PRESENT ILLNESS: [name] is a 72 years old right-handed African American female, accompanied by her husband, referred by her primary care physician Dr. Willey Blade for evaluation of left-sided headaches in March 2015.  She denies a previous history of migraine headaches, was very healthy, in October 18th 2014, at the hotel, close to midnight, she was trying to sit down at a rolling chair in front of the mirror but she mis-positioned herself, fell backwards on the floor, landed on her occipital region at the bathtub, there was no loss of consciousness, but she developed occipital area headaches afterwards, rest of the night, she was afraid of going to the sleep,  She was fine for 3 weeks, until early November 2014, she began to develop frequent, almost daily headaches, left parietal, left frontal, left retro-orbital region, 5 out of 7 days, the pain was moderate to severe, debilitating for her, lasting for a few hours, she has to take multiple doses of ibuprofen, she presented to the emergency room, CAT scan of the brain was normal, MRI of the brain was normal, She also complains of mild blurry vision, no gait difficulty, She is very hesitate about the potential side effect of the medication, she does not want to go on any preventive medications  UPDATE March 3rd 2015: She no longer has headaches, has been doing very well until 3 weeks ago, she woke up in the middle of the night using bathroom, felt sudden onset vertigo, gait difficulty, she was able to go back to bed, when she woke up next morning, she continued to felt vertigo, dizziness, unsteady gait, ever since the event, with sudden movement, she felt unsteadiness, no hearing loss, no tinnitus, no double vision,  UPDATE Aug 04 2015: Last clinical visit was in March 2015, she has been doing well until July 25 2015, she bumped forcefully into a  glass door, no loss of consciousness, but had persistent mild bilateral frontal headaches since the incident, overall has improved, she is planning on to have elective surgery under general anesthesia in August 08 2015, want to make sure she is fine to going through general anesthesia,  She also reported a "TIA"episode, when she presented with lateralized paresthesia, and transient weakness in 1988, she has no recollection of the detail anymore, we have personally reviewed MRI of the brain in November 2014, single isolated left periventricular gliosis. MRI of the brain and neck showed no large vessel disease,  UPDATE January 30 2018: On January 13, 2018, she was sitting in the swing, the canvas that covered the swing suddenly give out underneath her, she fell backwards, hit her occipital region on the iron bar, no loss of consciousness, she was able to walk back to home, then missed a stool in the kitchen, fell to the floor, no loss of consciousness, since then, she had low-grade bilateral frontal headaches, but with movement, she would develop left-sided severe pounding headache with light noise sensitivity, mild nauseous, for a while, she was taking Tylenol 2-3 times a day, presented to the emergency room on January 21, 2018 for persistent headaches, CT head without contrast showed mild atrophy supratentorium small vessel disease, there was no acute abnormality,  Her headache overall has some improvement, no significant headache when he sits still, with exertion, such as walking from the parking lot, she would develop mild left-sided headaches, no lateralized motor or sensory deficit,  She prefers not to take any preventive  medications  REVIEW OF SYSTEMS: Full 14 system review of systems performed and notable only for those listed, all others are neg:  Weight gain, fatigue, easy bruise, headaches, dizziness, joint pain  ALLERGIES: No Known Allergies  HOME MEDICATIONS: Outpatient Medications Prior to  Visit  Medication Sig Dispense Refill  . Ascorbic Acid (VITAMIN C) 1000 MG tablet Take 1,000 mg by mouth daily.    Marland Kitchen b complex vitamins tablet Take 1 tablet by mouth daily.    . Biotin 1000 MCG tablet Take 1,000 mcg by mouth daily.    . Capsicum, Cayenne, (CAYENNE PO) Take 1 capsule by mouth daily.     . Cholecalciferol (VITAMIN D) 2000 units tablet Take 2,000 Units by mouth daily.    . clindamycin (CLEOCIN) 150 MG capsule Take 3 capsules (450 mg total) by mouth 3 (three) times daily. 90 capsule 0  . COCONUT OIL PO Take 15 mLs by mouth daily.    . Docosahexaenoic Acid (DHA COMPLETE PO) Take 1 tablet by mouth daily.    Marland Kitchen GARLIC PO Take 1 tablet by mouth 4 (four) times a week.    . Ginkgo Biloba (GINKOBA PO) Take 1 tablet by mouth daily.    . Glucosamine HCl (GLUCOSAMINE PO) Take 3 capsules by mouth daily.    Marland Kitchen MAGNESIUM GLYCINATE PLUS PO Take 400 mg by mouth daily.     . NON FORMULARY Take 450 mg by mouth daily. Patient takes fennel    . OVER THE COUNTER MEDICATION Take 1 capsule by mouth daily. Patient takes Vegetal Silica    . Polyvinyl Alcohol-Povidone (REFRESH OP) Apply 1 drop to eye daily as needed (dry eyes).    . Probiotic Product (PROBIOTIC & ACIDOPHILUS EX ST PO) Take 1 tablet by mouth daily.     . Protein POWD Take 1 scoop by mouth daily. Mix with water    . Specialty Vitamins Products (ONE-A-DAY BONE STRENGTH PO) Take 3 tablets by mouth daily.    Marland Kitchen SPIRULINA PO Take 3 capsules by mouth daily.    . Turmeric POWD Take 5 mLs by mouth daily.    Marland Kitchen UNABLE TO FIND Med Name: [facility] oil, one tablespoon per day    . LECITHIN PO Take 10 g by mouth daily.      No facility-administered medications prior to visit.     PAST MEDICAL HISTORY: Past Medical History:  Diagnosis Date  . Anginal pain ([place])   . Arthritis    bilateral knees  . Chest pain   . Complication of anesthesia    see note about TIA  . Dizziness   . Head pain 07/25/15   recent blow to head  . History of concussion   .  Pericarditis 04/2015  . TIA (transient ischemic attack) 1988   occurred three days after anesthesia    PAST SURGICAL HISTORY: Past Surgical History:  Procedure Laterality Date  . APPENDECTOMY    . CERVICAL CONE BIOPSY    . COLONOSCOPY    . CYSTOCELE REPAIR    . DILATATION & CURETTAGE/HYSTEROSCOPY WITH MYOSURE N/A 08/08/2015   Procedure: DILATATION & CURETTAGE/HYSTEROSCOPY WITH MYOSURE;  Surgeon: Servando Salina, MD;  Location: [facility];  Service: Gynecology;  Laterality: N/A;  . TONSILLECTOMY    . TUBAL LIGATION      FAMILY HISTORY: Family History  Problem Relation Age of Onset  . Cancer Mother   . Heart disease Father   . Heart failure Father   . Coronary artery disease Unknown  SOCIAL HISTORY: Social History   Socioeconomic History  . Marital status: Married    Spouse name: Athelstan  . Number of children: 6  . Years of education: 34  . Highest education level: Master's degree (e.g., MA, MS, MEng, MEd, MSW, MBA)  Occupational History  . Occupation: Retired  Scientific laboratory technician  . Financial resource strain: Not on file  . Food insecurity:    Worry: Not on file    Inability: Not on file  . Transportation needs:    Medical: Not on file    Non-medical: Not on file  Tobacco Use  . Smoking status: Never Smoker  . Smokeless tobacco: Never Used  Substance and Sexual Activity  . Alcohol use: No  . Drug use: No  . Sexual activity: Yes    Birth control/protection: Post-menopausal  Lifestyle  . Physical activity:    Days per week: Not on file    Minutes per session: Not on file  . Stress: Not on file  Relationships  . Social connections:    Talks on phone: Not on file    Gets together: Not on file    Attends religious service: Not on file    Active member of club or organization: Not on file    Attends meetings of clubs or organizations: Not on file    Relationship status: Not on file  . Intimate partner violence:    Fear of current or ex partner: Not on file     Emotionally abused: Not on file    Physically abused: Not on file    Forced sexual activity: Not on file  Other Topics Concern  . Not on file  Social History Narrative   Patient is married Psychologist, counselling) and lives at home with her husband and her daughter.   Patient has five living children and one is deceased.   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   Patient is right handed.   Uses very little caffeine.     PHYSICAL EXAM  Vitals:   01/30/18 1121  Weight: 180 lb (81.6 kg)  Height: _0  (1.676 m)   Body mass index is 29.05 kg/m.  Generalized: Well developed, in no acute distress , well-groomed Head: normocephalic and atraumatic,. Oropharynx benign  Neck: Supple, no carotid bruits  Cardiac: Regular rate rhythm, no murmur  Musculoskeletal: No deformity   Neurological examination   Mentation: Alert oriented to time, place, history taking. MOCA 22/30.at last visit not repeated Follows all commands speech and language fluent.   Cranial nerve II-XII: Fundoscopic exam reveals sharp disc margins.Pupils were equal round reactive to light extraocular movements were full, visual field were full on confrontational test. Facial sensation and strength were normal. hearing was intact to finger rubbing bilaterally. Uvula tongue midline. head turning and shoulder shrug were normal and symmetric.Tongue protrusion into cheek strength was normal. Motor: normal bulk and tone, full strength in the BUE, BLE, fine finger movements normal, no pronator drift. No focal weakness  Sensory: normal and symmetric to light touch, pinprick, and  Vibration, in the upper and lower extremities Coordination: finger-nose-finger, heel-to-shin bilaterally, no dysmetria Reflexes: Brachioradialis 2/2, biceps 2/2, triceps 2/2, patellar 2/2, Achilles 2/2, plantar responses were flexor bilaterally. Gait and Station: Rising up from seated position without assistance, normal stance,    DIAGNOSTIC DATA  (LABS, IMAGING, TESTING)       Component Value Date/Time   NA 138 04/02/2015 1243   K 3.6 04/02/2015 1243   CL 104  04/02/2015 1243   CO2 22 04/02/2015 1243   GLUCOSE 91 04/02/2015 1243   BUN 10 04/02/2015 1243   CREATININE 0.80 04/02/2015 1243   CALCIUM 9.9 04/02/2015 1243   PROT 6.9 04/09/2014 0835   ALBUMIN 3.4 (L) 04/09/2014 0835   AST 21 04/09/2014 0835   ALT 11 04/09/2014 0835   ALKPHOS 69 04/09/2014 0835   BILITOT 0.4 04/09/2014 0835   GFRNONAA >60 04/02/2015 1243   GFRAA >60 04/02/2015 1243    ASSESSMENT AND PLAN  72 y.o. year old female  Headaches since fall on January 13, 2018  Has migraine features, triggered by fall  No significant abnormality on CAT scan of the brain  Headache has migraine features,  She does not want any prescription preventive medications  Magnesium oxide 400 mg twice a day, riboflavin 100 mg twice a day as preventive medications,  May consider Aleve, Tylenol as needed,  ESR C-reactive protein to rule out temporal arteritis  Marcial Pacas, M.D. Ph.D.  [facility] [place], [place] [address] Phone: [phone] Fax:      [phone]

## 2018-01-31 ENCOUNTER — Telehealth: Payer: Self-pay | Admitting: Neurology

## 2018-01-31 LAB — SEDIMENTATION RATE: Sed Rate: 111 mm/hr — ABNORMAL HIGH (ref 0–40)

## 2018-01-31 LAB — C-REACTIVE PROTEIN: CRP: 3 mg/L (ref 0–10)

## 2018-01-31 LAB — TSH: TSH: 0.53 u[IU]/mL (ref 0.450–4.500)

## 2018-01-31 NOTE — Telephone Encounter (Signed)
Patient is aware of her lab results and verbalized understanding.  She denies showing any signs/symptoms of any type of active infection.  She will come to our office one day this week for repeat labs.

## 2018-01-31 NOTE — Telephone Encounter (Signed)
Please call patient, laboratory evaluation showed significantly elevated ESR 111 with normal C-reactive protein,   Elevated ESR usually indicating systemic inflammatory/infectious process but it would usually correlate with elevated C-reactive protein,  So I would like to repeat ESR, C-reactive protein, CBC, and some other inflammatory markers,  Please also check to see if she has any signs of systemic infection such as UTI, upper respiratory infection,  Lab order was placed,

## 2018-02-02 ENCOUNTER — Other Ambulatory Visit (INDEPENDENT_AMBULATORY_CARE_PROVIDER_SITE_OTHER): Payer: Self-pay

## 2018-02-02 DIAGNOSIS — Z0289 Encounter for other administrative examinations: Secondary | ICD-10-CM

## 2018-02-03 ENCOUNTER — Telehealth: Payer: Self-pay | Admitting: Neurology

## 2018-02-03 DIAGNOSIS — R519 Headache, unspecified: Secondary | ICD-10-CM

## 2018-02-03 DIAGNOSIS — R51 Headache: Principal | ICD-10-CM

## 2018-02-03 LAB — CBC WITH DIFFERENTIAL/PLATELET
Basophils Absolute: 0 10*3/uL (ref 0.0–0.2)
Basos: 0 %
EOS (ABSOLUTE): 0 10*3/uL (ref 0.0–0.4)
Eos: 0 %
Hematocrit: 37 % (ref 34.0–46.6)
Hemoglobin: 11.6 g/dL (ref 11.1–15.9)
Immature Grans (Abs): 0 10*3/uL (ref 0.0–0.1)
Immature Granulocytes: 0 %
Lymphocytes Absolute: 1.6 10*3/uL (ref 0.7–3.1)
Lymphs: 52 %
MCH: 26 pg — ABNORMAL LOW (ref 26.6–33.0)
MCHC: 31.4 g/dL — ABNORMAL LOW (ref 31.5–35.7)
MCV: 83 fL (ref 79–97)
Monocytes Absolute: 0.4 10*3/uL (ref 0.1–0.9)
Monocytes: 11 %
Neutrophils Absolute: 1.2 10*3/uL — ABNORMAL LOW (ref 1.4–7.0)
Neutrophils: 37 %
Platelets: 315 10*3/uL (ref 150–450)
RBC: 4.46 x10E6/uL (ref 3.77–5.28)
RDW: 15.7 % — ABNORMAL HIGH (ref 12.3–15.4)
WBC: 3.2 10*3/uL — ABNORMAL LOW (ref 3.4–10.8)

## 2018-02-03 LAB — CK: Total CK: 73 U/L (ref 24–173)

## 2018-02-03 LAB — SEDIMENTATION RATE: Sed Rate: 78 mm/hr — ABNORMAL HIGH (ref 0–40)

## 2018-02-03 LAB — C-REACTIVE PROTEIN: CRP: 2 mg/L (ref 0–10)

## 2018-02-03 NOTE — Telephone Encounter (Signed)
Please call patient, repeat laboratory evaluation continue to demonstrate significantly elevated ESR 78, there was no significant anemia, or other explanation for her elevated ESR, C-reactive protein was normal,  If she still has significant headaches, we may consider low-dose steroid challenge for her headaches, possible condition of temporal arteritis, also check to see if she has any jaw claudication, muscle achiness,

## 2018-02-03 NOTE — Telephone Encounter (Signed)
LMTC./fim 

## 2018-02-03 NOTE — Telephone Encounter (Signed)
Tried to reach pt. on her cell# but received message that "the wireless customer you are trying to reach is not available, please try again later/fim

## 2018-02-07 NOTE — Telephone Encounter (Addendum)
I have explained to patient,  She complains of worsening headaches, blurry vision, fatigue, but she denies jaw claudication, no diffuse body achy pain,  Repeat lab showed normal CRP, significantly elevated ESR, previously was 111, repeat ESR was 78 which raised the possibility of temporal arteritis, especially her headache is 4 weeks from reported initial injury, instead of getting better, she has worsening headaches,  1, I have offered her surgical referral, temporal artery biopsy, 2. empirically treatment with prednisone 3. Symptoms control, tramadol vs Tylenol, NSAIDs as needed.  She will discuss with her family and call us back after discussion

## 2018-02-07 NOTE — Telephone Encounter (Signed)
Left another message on her home number requesting a return call.  Also, attempted cell phone but unable to leave message on it.

## 2018-02-08 ENCOUNTER — Telehealth: Payer: Self-pay | Admitting: Neurology

## 2018-02-08 MED ORDER — PREDNISONE 5 MG PO TABS: ORAL_TABLET | ORAL | 3 refills | Status: DC

## 2018-02-08 NOTE — Addendum Note (Signed)
Addended by: Levert Feinstein on: 02/08/2018 11:43 AM   Modules accepted: Orders

## 2018-02-08 NOTE — Telephone Encounter (Signed)
Please make the referral to general surgeon for temporal arteritis biopsy urgent.

## 2018-02-08 NOTE — Telephone Encounter (Signed)
Patient calling stating she would like a Rx for Prednisone sent to [facility] on [address] per conversation yesterday.

## 2018-02-08 NOTE — Telephone Encounter (Signed)
Spoke to patient and scheduled her a follow up appt for 03/08/18.

## 2018-02-08 NOTE — Telephone Encounter (Signed)
Referral has been to [facility] as Urgent Telephone [phone] - fax [phone].

## 2018-02-08 NOTE — Telephone Encounter (Addendum)
I have talked with patient, she would like to take prednisone, complains of moderate to severe daily headaches, instead of getting better, there is no significant change in past 4 weeks,  1.  Refer him to his general surgeon for temporal artery biopsy because of elevated ESR 2.  Starting prednisone tapering, 5 mg 4 tablets every morning for 1 week, then 2 tablets every morning for 1 week, then keep at 1 tablet every morning 3.  Give her a follow up visit in 4-6 weeks. 4. Call her in 2 weeks after prednisone treatment to check on her response with prednisone.

## 2018-02-17 ENCOUNTER — Ambulatory Visit: Payer: Self-pay | Admitting: General Surgery

## 2018-02-20 ENCOUNTER — Telehealth: Payer: Self-pay | Admitting: Neurology

## 2018-02-20 NOTE — Telephone Encounter (Signed)
Pt did see the surgeon. She has questions but did not want to relay these to me. Please call to advise

## 2018-02-22 NOTE — Telephone Encounter (Signed)
She is on her second week of Prednisone and her headaches have improved.  She has had a few intermittent sharp pains but much better than prior to starting the medication.  She also has an appt with [facility]yatt [facility].  She would like to discuss her appt details with Dr. Terrace ArabiaYan.  She has a pending appt on 03/02/18.

## 2018-03-02 ENCOUNTER — Ambulatory Visit: Payer: Medicare Other | Admitting: Neurology

## 2018-03-02 ENCOUNTER — Encounter: Payer: Self-pay | Admitting: Neurology

## 2018-03-02 ENCOUNTER — Telehealth: Payer: Self-pay | Admitting: Neurology

## 2018-03-02 VITALS — BP 115/79 | HR 83 | Ht 66.0 in | Wt 185.0 lb

## 2018-03-02 DIAGNOSIS — R899 Unspecified abnormal finding in specimens from other organs, systems and tissues: Secondary | ICD-10-CM

## 2018-03-02 DIAGNOSIS — R51 Headache: Secondary | ICD-10-CM

## 2018-03-02 DIAGNOSIS — R519 Headache, unspecified: Secondary | ICD-10-CM

## 2018-03-02 DIAGNOSIS — G44209 Tension-type headache, unspecified, not intractable: Secondary | ICD-10-CM | POA: Diagnosis not present

## 2018-03-02 HISTORY — DX: Unspecified abnormal finding in specimens from other organs, systems and tissues: R89.9

## 2018-03-02 NOTE — Progress Notes (Signed)
[facility]  PATIENT: [name] DOB: 1946-02-22  HISTORY OF PRESENT ILLNESS: [name] is a 72 years old right-handed African American female, accompanied by her husband, referred by her primary care physician Dr. Willey Blade for evaluation of left-sided headaches in March 2015.  She denies a previous history of migraine headaches, was very healthy, in October 18th 2014, at the hotel, close to midnight, she was trying to sit down at a rolling chair in front of the mirror but she mis-positioned herself, fell backwards on the floor, landed on her occipital region at the bathtub, there was no loss of consciousness, but she developed occipital area headaches afterwards, rest of the night, she was afraid of going to the sleep,  She was fine for 3 weeks, until early November 2014, she began to develop frequent, almost daily headaches, left parietal, left frontal, left retro-orbital region, 5 out of 7 days, the pain was moderate to severe, debilitating for her, lasting for a few hours, she has to take multiple doses of ibuprofen, she presented to the emergency room, CAT scan of the brain was normal, MRI of the brain was normal, She also complains of mild blurry vision, no gait difficulty, She is very hesitate about the potential side effect of the medication, she does not want to go on any preventive medications  UPDATE March 3rd 2015: She no longer has headaches, has been doing very well until 3 weeks ago, she woke up in the middle of the night using bathroom, felt sudden onset vertigo, gait difficulty, she was able to go back to bed, when she woke up next morning, she continued to felt vertigo, dizziness, unsteady gait, ever since the event, with sudden movement, she felt unsteadiness, no hearing loss, no tinnitus, no double vision,  UPDATE Aug 04 2015: Last clinical visit was in March 2015, she has been doing well until July 25 2015, she bumped forcefully into a  glass door, no loss of consciousness, but had persistent mild bilateral frontal headaches since the incident, overall has improved, she is planning on to have elective surgery under general anesthesia in August 08 2015, want to make sure she is fine to going through general anesthesia,  She also reported a "TIA"episode, when she presented with lateralized paresthesia, and transient weakness in 1988, she has no recollection of the detail anymore, we have personally reviewed MRI of the brain in November 2014, single isolated left periventricular gliosis. MRI of the brain and neck showed no large vessel disease,  UPDATE January 30 2018: On January 13, 2018, she was sitting in the swing, the canvas that covered the swing suddenly give out underneath her, she fell backwards, hit her occipital region on the iron bar, no loss of consciousness, she was able to walk back to home, then missed a stool in the kitchen, fell to the floor, no loss of consciousness, since then, she had low-grade bilateral frontal headaches, but with movement, she would develop left-sided severe pounding headache with light noise sensitivity, mild nauseous, for a while, she was taking Tylenol 2-3 times a day, presented to the emergency room on January 21, 2018 for persistent headaches, CT head without contrast showed mild atrophy supratentorium small vessel disease, there was no acute abnormality,  Her headache overall has some improvement, no significant headache when he sits still, with exertion, such as walking from the parking lot, she would develop mild left-sided headaches, no lateralized motor or sensory deficit,  She prefers not to take any preventive  medications  UPDATE Sept 26 2019: She was treated with prednisone tapering dose since beginning of September, starting from '20mg'$  daily for one week, then '10mg'$  daily for one week, now has stopped at 5 mg daily, she did much better with higher dose of prednisone, pressure pain, foggy  sensation, lightheaded, taking Tylenol as needed,  Laboratory evaluation February 02, 2018, CBC showed mildly decreased WBC 3.2, hemoglobin of 11.6, MCH was decreased to 26, normal CPK 73, C-reactive protein 2, ESR was mildly elevated 78, TSH 0.53,  She has been seen by surgeon, but not has temporal artery biopsy yet  REVIEW OF SYSTEMS: Full 14 system review of systems performed and notable only for those listed, all others are neg:  Fatigue, eye pain, blurry vision, excessive eating, all rest of review of the system were negative  ALLERGIES: No Known Allergies  HOME MEDICATIONS: Outpatient Medications Prior to Visit  Medication Sig Dispense Refill  . Ascorbic Acid (VITAMIN C) 1000 MG tablet Take 1,000 mg by mouth daily.    Marland Kitchen b complex vitamins tablet Take 1 tablet by mouth daily.    . Biotin 1000 MCG tablet Take 1,000 mcg by mouth daily.    . Capsicum, Cayenne, (CAYENNE PO) Take 1 capsule by mouth daily.     . Cholecalciferol (VITAMIN D) 2000 units tablet Take 2,000 Units by mouth daily.    . clindamycin (CLEOCIN) 150 MG capsule Take 3 capsules (450 mg total) by mouth 3 (three) times daily. 90 capsule 0  . COCONUT OIL PO Take 15 mLs by mouth daily.    . Docosahexaenoic Acid (DHA COMPLETE PO) Take 1 tablet by mouth daily.    Marland Kitchen GARLIC PO Take 1 tablet by mouth 4 (four) times a week.    . Ginkgo Biloba (GINKOBA PO) Take 1 tablet by mouth daily.    . Glucosamine HCl (GLUCOSAMINE PO) Take 3 capsules by mouth daily.    Marland Kitchen MAGNESIUM GLYCINATE PLUS PO Take 400 mg by mouth daily.     . NON FORMULARY Take 450 mg by mouth daily. Patient takes fennel    . OVER THE COUNTER MEDICATION Take 1 capsule by mouth daily. Patient takes Vegetal Silica    . Polyvinyl Alcohol-Povidone (REFRESH OP) Apply 1 drop to eye daily as needed (dry eyes).    . predniSONE (DELTASONE) 5 MG tablet 4 tabs every morning for one week Then 2 tabs every morning for one week. Then one tab every morning 60 tablet 3  . Probiotic  Product (PROBIOTIC & ACIDOPHILUS EX ST PO) Take 1 tablet by mouth daily.     . Protein POWD Take 1 scoop by mouth daily. Mix with water    . Specialty Vitamins Products (ONE-A-DAY BONE STRENGTH PO) Take 3 tablets by mouth daily.    Marland Kitchen SPIRULINA PO Take 3 capsules by mouth daily.    . Turmeric POWD Take 5 mLs by mouth daily.    Marland Kitchen UNABLE TO FIND Med Name: [facility] oil, one tablespoon per day     No facility-administered medications prior to visit.     PAST MEDICAL HISTORY: Past Medical History:  Diagnosis Date  . Anginal pain ([place])   . Arthritis    bilateral knees  . Chest pain   . Complication of anesthesia    see note about TIA  . Dizziness   . Head pain 07/25/15   recent blow to head  . History of concussion   . Pericarditis 04/2015  . TIA (transient ischemic attack) 1988  occurred three days after anesthesia    PAST SURGICAL HISTORY: Past Surgical History:  Procedure Laterality Date  . APPENDECTOMY    . CERVICAL CONE BIOPSY    . COLONOSCOPY    . CYSTOCELE REPAIR    . DILATATION & CURETTAGE/HYSTEROSCOPY WITH MYOSURE N/A 08/08/2015   Procedure: DILATATION & CURETTAGE/HYSTEROSCOPY WITH MYOSURE;  Surgeon: Servando Salina, MD;  Location: [facility];  Service: Gynecology;  Laterality: N/A;  . TONSILLECTOMY    . TUBAL LIGATION      FAMILY HISTORY: Family History  Problem Relation Age of Onset  . Cancer Mother   . Heart disease Father   . Heart failure Father   . Coronary artery disease Unknown     SOCIAL HISTORY: Social History   Socioeconomic History  . Marital status: Married    Spouse name: Athelstan  . Number of children: 6  . Years of education: 51  . Highest education level: Master's degree (e.g., MA, MS, MEng, MEd, MSW, MBA)  Occupational History  . Occupation: Retired  Scientific laboratory technician  . Financial resource strain: Not on file  . Food insecurity:    Worry: Not on file    Inability: Not on file  . Transportation needs:    Medical: Not on file    Non-medical:  Not on file  Tobacco Use  . Smoking status: Never Smoker  . Smokeless tobacco: Never Used  Substance and Sexual Activity  . Alcohol use: No  . Drug use: No  . Sexual activity: Yes    Birth control/protection: Post-menopausal  Lifestyle  . Physical activity:    Days per week: Not on file    Minutes per session: Not on file  . Stress: Not on file  Relationships  . Social connections:    Talks on phone: Not on file    Gets together: Not on file    Attends religious service: Not on file    Active member of club or organization: Not on file    Attends meetings of clubs or organizations: Not on file    Relationship status: Not on file  . Intimate partner violence:    Fear of current or ex partner: Not on file    Emotionally abused: Not on file    Physically abused: Not on file    Forced sexual activity: Not on file  Other Topics Concern  . Not on file  Social History Narrative   Patient is married Psychologist, counselling) and lives at home with her husband and her daughter.   Patient has five living children and one is deceased.   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   Patient is right handed.   Uses very little caffeine.     PHYSICAL EXAM  There were no vitals filed for this visit. There is no height or weight on file to calculate BMI.  Generalized: Well developed, in no acute distress , well-groomed Head: normocephalic and atraumatic,. Oropharynx benign  Neck: Supple, no carotid bruits  Cardiac: Regular rate rhythm, no murmur  Musculoskeletal: No deformity   Neurological examination   Mentation: Alert oriented to time, place, history taking. MOCA 22/30.at last visit not repeated Follows all commands speech and language fluent.   Cranial nerve II-XII:  Pupils were small reactive to light extraocular movements were full, visual field were full on confrontational test. Facial sensation and strength were normal. hearing was intact to finger rubbing bilaterally.  Uvula tongue midline. head turning and shoulder shrug were normal and  symmetric.Tongue protrusion into cheek strength was normal. Motor: normal bulk and tone, full strength in the BUE, BLE, fine finger movements normal, no pronator drift. No focal weakness  Sensory: normal and symmetric to light touch, pinprick, and  Vibration, in the upper and lower extremities Coordination: finger-nose-finger, heel-to-shin bilaterally, no dysmetria Reflexes: Brachioradialis 2/2, biceps 2/2, triceps 2/2, patellar 2/2, Achilles 2/2, plantar responses were flexor bilaterally. Gait and Station: Rising up from seated position without assistance, normal stance,    DIAGNOSTIC DATA (LABS, IMAGING, TESTING)       Component Value Date/Time   NA 138 04/02/2015 1243   K 3.6 04/02/2015 1243   CL 104 04/02/2015 1243   CO2 22 04/02/2015 1243   GLUCOSE 91 04/02/2015 1243   BUN 10 04/02/2015 1243   CREATININE 0.80 04/02/2015 1243   CALCIUM 9.9 04/02/2015 1243   PROT 6.9 04/09/2014 0835   ALBUMIN 3.4 (L) 04/09/2014 0835   AST 21 04/09/2014 0835   ALT 11 04/09/2014 0835   ALKPHOS 69 04/09/2014 0835   BILITOT 0.4 04/09/2014 0835   GFRNONAA >60 04/02/2015 1243   GFRAA >60 04/02/2015 1243    ASSESSMENT AND PLAN  72 y.o. year old female  Headaches since fall on January 13, 2018  Has migraine features, triggered by fall  No significant abnormality on CAT scan of the brain  She does not want any prescription preventive medications  Magnesium oxide 400 mg twice a day, riboflavin 100 mg twice a day as preventive medications,  May consider Aleve, Tylenol as needed,  ESR C-reactive protein was elevated, will repeat level again, if ESR remain elevated, will refer her for temporal artery biopsy to rule out temporal arteritis  She did respond well with short course of prednisone in September 2019, starting with 20 mg, tapered off to 5 mg in 3 weeks,  [name], M.D. Ph.D.  [facility] [place], [place] [address] Phone: [phone] Fax:      [phone]

## 2018-03-02 NOTE — Telephone Encounter (Signed)
[employer] Medicare order sent to GI. No auth they will reach out to the pt to schedule.  °

## 2018-03-03 ENCOUNTER — Telehealth: Payer: Self-pay | Admitting: Neurology

## 2018-03-03 ENCOUNTER — Encounter (HOSPITAL_BASED_OUTPATIENT_CLINIC_OR_DEPARTMENT_OTHER): Payer: Self-pay | Admitting: *Deleted

## 2018-03-03 ENCOUNTER — Other Ambulatory Visit: Payer: Self-pay

## 2018-03-03 LAB — CBC WITH DIFFERENTIAL/PLATELET
Basophils Absolute: 0 10*3/uL (ref 0.0–0.2)
Basos: 0 %
EOS (ABSOLUTE): 0 10*3/uL (ref 0.0–0.4)
Eos: 0 %
Hematocrit: 40.5 % (ref 34.0–46.6)
Hemoglobin: 12.6 g/dL (ref 11.1–15.9)
Immature Grans (Abs): 0 10*3/uL (ref 0.0–0.1)
Immature Granulocytes: 1 %
Lymphocytes Absolute: 1.2 10*3/uL (ref 0.7–3.1)
Lymphs: 33 %
MCH: 25.4 pg — ABNORMAL LOW (ref 26.6–33.0)
MCHC: 31.1 g/dL — ABNORMAL LOW (ref 31.5–35.7)
MCV: 82 fL (ref 79–97)
Monocytes Absolute: 0.2 10*3/uL (ref 0.1–0.9)
Monocytes: 6 %
Neutrophils Absolute: 2.1 10*3/uL (ref 1.4–7.0)
Neutrophils: 60 %
Platelets: 358 10*3/uL (ref 150–450)
RBC: 4.96 x10E6/uL (ref 3.77–5.28)
RDW: 15.3 % (ref 12.3–15.4)
WBC: 3.5 10*3/uL (ref 3.4–10.8)

## 2018-03-03 LAB — C-REACTIVE PROTEIN: CRP: 6 mg/L (ref 0–10)

## 2018-03-03 LAB — SEDIMENTATION RATE: Sed Rate: 109 mm/hr — ABNORMAL HIGH (ref 0–40)

## 2018-03-03 NOTE — Telephone Encounter (Signed)
I have called patient, the repeat ESR was elevated 108, normal CRP, cbc.  She continue have intermittent headache, foggy sensation, more symptomatic after tapering of prednisone,  I have suggested her to call surgical center for temporal artery biopsy, hold off prednisone treatment before biopsy.

## 2018-03-06 ENCOUNTER — Telehealth: Payer: Self-pay | Admitting: *Deleted

## 2018-03-06 NOTE — Telephone Encounter (Signed)
Patient wants Dr. Terrace Arabia to know the Biopsy has been scheduled for Oct. 3 with Dr. [name] at [facility].  Call if questions.

## 2018-03-07 ENCOUNTER — Ambulatory Visit
Admission: RE | Admit: 2018-03-07 | Discharge: 2018-03-07 | Disposition: A | Discharge status: Home / Self Care | Payer: Medicare Other | Source: Ambulatory Visit | Attending: General Surgery | Admitting: General Surgery

## 2018-03-07 ENCOUNTER — Encounter (HOSPITAL_BASED_OUTPATIENT_CLINIC_OR_DEPARTMENT_OTHER)
Admission: RE | Admit: 2018-03-07 | Discharge: 2018-03-07 | Disposition: A | Discharge status: Home / Self Care | Payer: Medicare Other | Source: Ambulatory Visit | Attending: General Surgery | Admitting: General Surgery

## 2018-03-07 DIAGNOSIS — H538 Other visual disturbances: Secondary | ICD-10-CM | POA: Diagnosis not present

## 2018-03-07 DIAGNOSIS — R51 Headache: Secondary | ICD-10-CM | POA: Diagnosis present

## 2018-03-07 DIAGNOSIS — Z7952 Long term (current) use of systemic steroids: Secondary | ICD-10-CM | POA: Diagnosis not present

## 2018-03-07 DIAGNOSIS — Z8673 Personal history of transient ischemic attack (TIA), and cerebral infarction without residual deficits: Secondary | ICD-10-CM | POA: Diagnosis not present

## 2018-03-07 DIAGNOSIS — Z79899 Other long term (current) drug therapy: Secondary | ICD-10-CM | POA: Diagnosis not present

## 2018-03-07 LAB — BASIC METABOLIC PANEL
Anion gap: 9 (ref 5–15)
BUN: 11 mg/dL (ref 8–23)
CO2: 23 mmol/L (ref 22–32)
Calcium: 10.2 mg/dL (ref 8.9–10.3)
Chloride: 109 mmol/L (ref 98–111)
Creatinine, Ser: 0.84 mg/dL (ref 0.44–1.00)
GFR calc Af Amer: >60 mL/min (ref >60)
GFR calc non Af Amer: >60 mL/min (ref >60)
Glucose, Bld: 71 mg/dL (ref 70–99)
Potassium: 3.9 mmol/L (ref 3.5–5.1)
Sodium: 141 mmol/L (ref 135–145)

## 2018-03-07 NOTE — Telephone Encounter (Signed)
Patient calling to discuss biopsy that is scheduled.

## 2018-03-07 NOTE — Telephone Encounter (Signed)
Spoke to patient - she wanted to confirm that she plans to move forward with her temporal artery biopsy on 03/09/18.  She is also going to call [facility] back today and schedule her MRI.  I provided her with their office number.

## 2018-03-08 ENCOUNTER — Ambulatory Visit: Payer: Self-pay | Admitting: Neurology

## 2018-03-08 ENCOUNTER — Encounter (HOSPITAL_BASED_OUTPATIENT_CLINIC_OR_DEPARTMENT_OTHER): Payer: Self-pay | Admitting: *Deleted

## 2018-03-08 NOTE — Progress Notes (Signed)
Ensure pre surgery drink given with instructions to complete by [name], pt verbalized understanding.  Pt given instructions to obtain a chest xray at [name], pt verbalized understanding.

## 2018-03-08 NOTE — H&P (Signed)
[name] Documented: 02/17/2018 2:38 PM Location: [facility] Patient #: [id number] DOB: 1945/12/03 Married / Language: English / Race: Black or African American Female   History of Present Illness [name]. Welcome Fults MD; 02/17/2018 3:00 PM) Patient words: History of headaches, flashing lights on the left in her eye, light sensitivity, This has been after a fall from a swing hitting her occipital area. Improved with steroid course which she is currently on.  The patient is a 72 year old female.   Past Surgical History (Tanisha A. Manson Passey, RMA; 02/17/2018 2:38 PM) Appendectomy  Oral Surgery  Shoulder Surgery  Right.  Diagnostic Studies History (Tanisha A. Manson Passey, RMA; 02/17/2018 2:38 PM) Colonoscopy  5-10 years ago Mammogram  within last year Pap Smear  1-5 years ago  Allergies (Tanisha A. Manson Passey, RMA; 02/17/2018 2:39 PM) No Known Drug Allergies [02/17/2018]: Allergies Reconciled   Medication History (Tanisha A. Manson Passey, RMA; 02/17/2018 2:41 PM) predniSONE (1MG  Tablet, Oral) Active. Ascorbic Acid (1000MG  Tablet, Oral) Active. B Complex (Oral) Active. Biotin ( Tablet, Oral) Active. Vitamin D (2000UNIT Tablet, Oral) Active. Garlic (1MG  Capsule, Oral) Active. Magnesium (400MG  Tablet, Oral) Active. Medications Reconciled  Social History (Tanisha A. Manson Passey, RMA; 02/17/2018 2:38 PM) Caffeine use  Carbonated beverages, Tea. No alcohol use  No drug use  Tobacco use  Never smoker.  Family History (Tanisha A. Manson Passey, RMA; 02/17/2018 2:38 PM) Arthritis  Sister. Cervical Cancer  Mother. Heart Disease  Father. Hypertension  Father.  Pregnancy / Birth History (Tanisha A. Manson Passey, RMA; 02/17/2018 2:38 PM) Age at menarche  11 years. Age of menopause  <45 Contraceptive History  Intrauterine device, Oral contraceptives. Gravida  6 Length (months) of breastfeeding  3-6 Maternal age  51-20 Para  6  Other Problems (Tanisha A. Manson Passey, RMA;  02/17/2018 2:38 PM) Back Pain  Bladder Problems  Cerebrovascular Accident  Chest pain     Review of Systems (Tanisha A. Brown RMA; 02/17/2018 2:38 PM) General Present- Fatigue, Night Sweats and Weight Gain. Not Present- Appetite Loss, Chills, Fever and Weight Loss. Skin Not Present- Change in Wart/Mole, Dryness, Hives, Jaundice, New Lesions, Non-Healing Wounds, Rash and Ulcer. HEENT Present- Visual Disturbances and Wears glasses/contact lenses. Not Present- Earache, Hearing Loss, Hoarseness, Nose Bleed, Oral Ulcers, Ringing in the Ears, Seasonal Allergies, Sinus Pain, Sore Throat and Yellow Eyes. Respiratory Not Present- Bloody sputum, Chronic Cough, Difficulty Breathing, Snoring and Wheezing. Breast Not Present- Breast Mass, Breast Pain, Nipple Discharge and Skin Changes. Cardiovascular Not Present- Chest Pain, Difficulty Breathing Lying Down, Leg Cramps, Palpitations, Rapid Heart Rate, Shortness of Breath and Swelling of Extremities. Gastrointestinal Present- Bloating. Not Present- Abdominal Pain, Bloody Stool, Change in Bowel Habits, Chronic diarrhea, Constipation, Difficulty Swallowing, Excessive gas, Gets full quickly at meals, Hemorrhoids, Indigestion, Nausea, Rectal Pain and Vomiting. Female Genitourinary Present- Urgency. Not Present- Frequency, Nocturia, Painful Urination and Pelvic Pain. Neurological Present- Headaches and Weakness. Not Present- Decreased Memory, Fainting, Numbness, Seizures, Tingling, Tremor and Trouble walking. Psychiatric Not Present- Anxiety, Bipolar, Change in Sleep Pattern, Depression, Fearful and Frequent crying. Endocrine Present- Hot flashes. Not Present- Cold Intolerance, Excessive Hunger, Hair Changes, Heat Intolerance and New Diabetes. Hematology Present- Easy Bruising. Not Present- Blood Thinners, Excessive bleeding, Gland problems, HIV and Persistent Infections.  Vitals (Tanisha A. Brown RMA; 02/17/2018 2:39 PM) 02/17/2018 2:39 PM Weight: 185 lb  Height: 66in Body Surface Area: 1.93 m Body Mass Index: 29.86 kg/m  Temp.: 98.16F  Pulse: 86 (Regular)  BP: 124/86 (Sitting, Left Arm, Standard) BP today 123/70 P79  Patient  without additional symptoms  Physical Exam [name] O. [name] MD; 02/17/2018 3:02 PM) General Mental Status-Alert. General Appearance-Well groomed. Orientation-Oriented X4. Build & Nutrition-Asthenic.  Head and Neck Note: No woody induration of her temporal areas bilatrerally. No tenderness or pain. Palpable pulse in the temporal artery.   Chest and Lung Exam Chest and lung exam reveals -normal excursion with symmetric chest walls and quiet, even and easy respiratory effort with no use of accessory muscles.  Cardiovascular Cardiovascular examination reveals -normal heart sounds, regular rate and rhythm with no murmurs.    Assessment & Plan [name] O. 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  [name]. Gae Bon, MD, FACS [phone] [facility]

## 2018-03-09 ENCOUNTER — Encounter (HOSPITAL_BASED_OUTPATIENT_CLINIC_OR_DEPARTMENT_OTHER)
Admission: RE | Disposition: A | Discharge status: Home / Self Care | Payer: Self-pay | Source: Ambulatory Visit | Attending: General Surgery

## 2018-03-09 ENCOUNTER — Ambulatory Visit (HOSPITAL_BASED_OUTPATIENT_CLINIC_OR_DEPARTMENT_OTHER)
Admission: RE | Admit: 2018-03-09 | Discharge: 2018-03-09 | Disposition: A | Discharge status: Home / Self Care | Payer: Medicare Other | Source: Ambulatory Visit | Attending: General Surgery | Admitting: General Surgery

## 2018-03-09 ENCOUNTER — Ambulatory Visit (HOSPITAL_BASED_OUTPATIENT_CLINIC_OR_DEPARTMENT_OTHER): Payer: Medicare Other | Admitting: Anesthesiology

## 2018-03-09 ENCOUNTER — Encounter (HOSPITAL_BASED_OUTPATIENT_CLINIC_OR_DEPARTMENT_OTHER): Payer: Self-pay

## 2018-03-09 ENCOUNTER — Other Ambulatory Visit: Payer: Self-pay

## 2018-03-09 ENCOUNTER — Ambulatory Visit (HOSPITAL_BASED_OUTPATIENT_CLINIC_OR_DEPARTMENT_OTHER): Admit: 2018-03-09 | Payer: Medicare Other | Admitting: General Surgery

## 2018-03-09 ENCOUNTER — Encounter (HOSPITAL_BASED_OUTPATIENT_CLINIC_OR_DEPARTMENT_OTHER): Payer: Self-pay | Admitting: Anesthesiology

## 2018-03-09 DIAGNOSIS — R51 Headache: Secondary | ICD-10-CM | POA: Insufficient documentation

## 2018-03-09 DIAGNOSIS — H538 Other visual disturbances: Secondary | ICD-10-CM | POA: Diagnosis not present

## 2018-03-09 DIAGNOSIS — Z8673 Personal history of transient ischemic attack (TIA), and cerebral infarction without residual deficits: Secondary | ICD-10-CM | POA: Insufficient documentation

## 2018-03-09 DIAGNOSIS — Z79899 Other long term (current) drug therapy: Secondary | ICD-10-CM | POA: Insufficient documentation

## 2018-03-09 DIAGNOSIS — Z7952 Long term (current) use of systemic steroids: Secondary | ICD-10-CM | POA: Insufficient documentation

## 2018-03-09 DIAGNOSIS — Z01818 Encounter for other preprocedural examination: Secondary | ICD-10-CM

## 2018-03-09 HISTORY — PX: ARTERY BIOPSY: SHX891

## 2018-03-09 SURGERY — BIOPSY TEMPORAL ARTERY
Anesthesia: Monitor Anesthesia Care | Laterality: Left

## 2018-03-09 SURGERY — BIOPSY TEMPORAL ARTERY
Anesthesia: Monitor Anesthesia Care | Site: Head | Laterality: Left

## 2018-03-09 MED ORDER — LIDOCAINE HCL (PF) 1 % IJ SOLN
INTRAMUSCULAR | Status: DC | PRN
  Administered 2018-03-09: 1 mL

## 2018-03-09 MED ORDER — FENTANYL CITRATE (PF) 100 MCG/2ML IJ SOLN
50.0000 ug | INTRAMUSCULAR | Status: DC | PRN
  Administered 2018-03-09 (×2): 50 ug via INTRAVENOUS

## 2018-03-09 MED ORDER — ACETAMINOPHEN 500 MG PO TABS: 1000.0000 mg | ORAL_TABLET | Freq: Once | ORAL | Status: DC | PRN

## 2018-03-09 MED ORDER — BUPIVACAINE-EPINEPHRINE 0.25% -1:200000 IJ SOLN
INTRAMUSCULAR | Status: DC | PRN
  Administered 2018-03-09: 3 mL

## 2018-03-09 MED ORDER — OXYCODONE HCL 5 MG PO TABS: 5.0000 mg | ORAL_TABLET | Freq: Once | ORAL | Status: DC | PRN

## 2018-03-09 MED ORDER — CHLORHEXIDINE GLUCONATE CLOTH 2 % EX PADS: 6.0000 | MEDICATED_PAD | Freq: Once | CUTANEOUS | Status: DC

## 2018-03-09 MED ORDER — ACETAMINOPHEN 500 MG PO TABS
ORAL_TABLET | ORAL | Status: AC
  Filled 2018-03-09: qty 2

## 2018-03-09 MED ORDER — FENTANYL CITRATE (PF) 100 MCG/2ML IJ SOLN
INTRAMUSCULAR | Status: AC
  Filled 2018-03-09: qty 2

## 2018-03-09 MED ORDER — MIDAZOLAM HCL 2 MG/2ML IJ SOLN: 1.0000 mg | INTRAMUSCULAR | Status: DC | PRN

## 2018-03-09 MED ORDER — LIDOCAINE 2% (20 MG/ML) 5 ML SYRINGE
INTRAMUSCULAR | Status: DC | PRN
  Administered 2018-03-09: 60 mg via INTRAVENOUS

## 2018-03-09 MED ORDER — PROPOFOL 500 MG/50ML IV EMUL
INTRAVENOUS | Status: DC | PRN
  Administered 2018-03-09: 25 ug/kg/min via INTRAVENOUS

## 2018-03-09 MED ORDER — ACETAMINOPHEN 10 MG/ML IV SOLN: 1000.0000 mg | Freq: Once | INTRAVENOUS | Status: DC | PRN

## 2018-03-09 MED ORDER — OXYCODONE HCL 5 MG/5ML PO SOLN: 5.0000 mg | Freq: Once | ORAL | Status: DC | PRN

## 2018-03-09 MED ORDER — SCOPOLAMINE 1 MG/3DAYS TD PT72: 1.0000 | MEDICATED_PATCH | Freq: Once | TRANSDERMAL | Status: DC | PRN

## 2018-03-09 MED ORDER — ACETAMINOPHEN 160 MG/5ML PO SOLN: 1000.0000 mg | Freq: Once | ORAL | Status: DC | PRN

## 2018-03-09 MED ORDER — FENTANYL CITRATE (PF) 100 MCG/2ML IJ SOLN: 25.0000 ug | INTRAMUSCULAR | Status: DC | PRN

## 2018-03-09 MED ORDER — LACTATED RINGERS IV SOLN
INTRAVENOUS | Status: DC
  Administered 2018-03-09: 10:00:00 via INTRAVENOUS

## 2018-03-09 MED ORDER — ACETAMINOPHEN 500 MG PO TABS
1000.0000 mg | ORAL_TABLET | ORAL | Status: AC
  Administered 2018-03-09: 1000 mg via ORAL

## 2018-03-09 MED ORDER — ONDANSETRON HCL 4 MG/2ML IJ SOLN
INTRAMUSCULAR | Status: DC | PRN
  Administered 2018-03-09: 4 mg via INTRAVENOUS

## 2018-03-09 SURGICAL SUPPLY — 44 items
BLADE CLIPPER SURG (BLADE) ×2 IMPLANT
BLADE SURG 15 STRL LF DISP TIS (BLADE) ×1 IMPLANT
BLADE SURG 15 STRL SS (BLADE) ×1
CANISTER SUCT 1200ML W/VALVE (MISCELLANEOUS) IMPLANT
CLEANER CAUTERY TIP 5X5 PAD (MISCELLANEOUS) ×1 IMPLANT
COVER BACK TABLE 60X90IN (DRAPES) ×2 IMPLANT
COVER MAYO STAND STRL (DRAPES) ×2 IMPLANT
DECANTER SPIKE VIAL GLASS SM (MISCELLANEOUS) IMPLANT
DERMABOND ADVANCED (GAUZE/BANDAGES/DRESSINGS) ×1
DERMABOND ADVANCED .7 DNX12 (GAUZE/BANDAGES/DRESSINGS) ×1 IMPLANT
DRAPE LAPAROTOMY 100X72 PEDS (DRAPES) IMPLANT
DRAPE U-SHAPE 76X120 STRL (DRAPES) ×2 IMPLANT
DRAPE UTILITY XL STRL (DRAPES) ×2 IMPLANT
DRSG TEGADERM 2-3/8X2-3/4 SM (GAUZE/BANDAGES/DRESSINGS) ×2 IMPLANT
DRSG TELFA 3X8 NADH (GAUZE/BANDAGES/DRESSINGS) ×2 IMPLANT
ELECT NEEDLE BLADE 2-5/6 (NEEDLE) ×2 IMPLANT
ELECT REM PT RETURN 9FT ADLT (ELECTROSURGICAL) ×2
ELECTRODE REM PT RTRN 9FT ADLT (ELECTROSURGICAL) ×1 IMPLANT
GAUZE 4X4 16PLY RFD (DISPOSABLE) IMPLANT
GLOVE BIOGEL PI IND STRL 6.5 (GLOVE) ×1 IMPLANT
GLOVE BIOGEL PI IND STRL 7.0 (GLOVE) ×1 IMPLANT
GLOVE BIOGEL PI IND STRL 8 (GLOVE) ×1 IMPLANT
GLOVE BIOGEL PI INDICATOR 6.5 (GLOVE) ×1
GLOVE BIOGEL PI INDICATOR 7.0 (GLOVE) ×1
GLOVE BIOGEL PI INDICATOR 8 (GLOVE) ×1
GLOVE ECLIPSE 7.5 STRL STRAW (GLOVE) ×2 IMPLANT
GLOVE SURG SS PI 6.5 STRL IVOR (GLOVE) ×2 IMPLANT
GOWN STRL REUS W/ TWL LRG LVL3 (GOWN DISPOSABLE) ×2 IMPLANT
GOWN STRL REUS W/TWL LRG LVL3 (GOWN DISPOSABLE) ×2
NEEDLE PRECISIONGLIDE 27X1.5 (NEEDLE) ×2 IMPLANT
PACK BASIN DAY SURGERY FS (CUSTOM PROCEDURE TRAY) ×2 IMPLANT
PAD CLEANER CAUTERY TIP 5X5 (MISCELLANEOUS) ×1
PENCIL BUTTON HOLSTER BLD 10FT (ELECTRODE) ×2 IMPLANT
SLEEVE SCD COMPRESS KNEE MED (MISCELLANEOUS) IMPLANT
STRIP CLOSURE SKIN 1/4X4 (GAUZE/BANDAGES/DRESSINGS) ×2 IMPLANT
SUT VIC AB 4-0 SH 27 (SUTURE)
SUT VIC AB 4-0 SH 27XANBCTRL (SUTURE) IMPLANT
SUT VIC AB 5-0 P-3 18X BRD (SUTURE) ×1 IMPLANT
SUT VIC AB 5-0 P3 18 (SUTURE) ×2
SUT VICRYL AB 3 0 TIES (SUTURE) ×2 IMPLANT
SYR CONTROL 10ML LL (SYRINGE) ×2 IMPLANT
TOWEL GREEN STERILE FF (TOWEL DISPOSABLE) ×2 IMPLANT
TUBE CONNECTING 20X1/4 (TUBING) ×2 IMPLANT
YANKAUER SUCT BULB TIP NO VENT (SUCTIONS) IMPLANT

## 2018-03-09 NOTE — Discharge Instructions (Addendum)
Temporal Artery Biopsy, Care After Refer to this sheet in the next few weeks. These instructions provide you with information about caring for yourself after your procedure. Your health care provider may also give you more specific instructions. Your treatment has been planned according to current medical practices, but problems sometimes occur. Call your health care provider if you have any problems or questions after your procedure. What can I expect after the procedure? After the procedure, it is common to have:  Soreness.  Bruising.  Numbness.  Swelling.  Follow these instructions at home: Incision care  Follow instructions from your health care provider about how to take care of the cut made during surgery (incision). Make sure you: ? Wash your hands with soap and water before you change your bandage (dressing). If soap and water are not available, use hand sanitizer. ? Change your dressing as told by your health care provider. ? Leave stitches (sutures), skin glue, or adhesive strips in place. These skin closures may need to stay in place for 2 weeks or longer. If adhesive strip edges start to loosen and curl up, you may trim the loose edges. Do not remove adhesive strips completely unless your health care provider tells you to do ththe Steri=Strips will come off in a few days  Check your incision area every day for signs of infection. Watch for: ? More redness, swelling, or pain. ? More fluid or blood. ? Warmth. ? Pus or a bad smell. Activity  Do not do any physical work or strenuous exercise until your health care provider approves.  Do not lift anything that is heavier than 10 lb (4.5 kg).  No straining General instructions   Take over-the-counter and prescription medicines only as told by your health care provider. Do not take aspirin or other NSAIDs unless told by your health care provider. Aspirin may increase your risk of bleeding at the incision site.  Follow your  health care provider's instructions on bathing or showering.  Keep all follow-up visits as told by your health care provider. This is important. Contact a health care provider if:  Medicine does not help your pain.  You have a fever or chills.  You have more redness, swelling, or pain around your incision site.  You have more fluid or blood coming from your incision site.  Your incision feels warm to the touch.  You have pus or a bad smell coming from your incision site.  You have a fever.  You develop nausea or vomiting. Get help right away if:  You have bleeding from the incision that does not stop after 30 minutes of applying heavy pressure.  You have chest pain.  You have shortness of breath.  You faint.  You have sudden vision loss.  You develop weakness or drooping in your face or eye. This information is not intended to replace advice given to you by your health care provider. Make sure you discuss any questions you have with your health care provider.  Marta Lamas. Gae Bon, MD, FACS [phone] Surgery     Post Anesthesia Home Care Instructions  Activity: Get plenty of rest for the remainder of the day. A responsible individual must stay with you for 24 hours following the procedure.  For the next 24 hours, DO NOT: -Drive a car -Advertising copywriter -Drink alcoholic beverages -Take any medication unless instructed by your physician -Make any legal decisions or sign important papers.  Meals: Start with liquid foods such as  gelatin or soup. Progress to regular foods as tolerated. Avoid greasy, spicy, heavy foods. If nausea and/or vomiting occur, drink only clear liquids until the nausea and/or vomiting subsides. Call your physician if vomiting continues.  Special Instructions/Symptoms: Your throat may feel dry or sore from the anesthesia or the breathing tube placed in your throat during surgery. If this  causes discomfort, gargle with warm salt water. The discomfort should disappear within 24 hours.  If you had a scopolamine patch placed behind your ear for the management of post- operative nausea and/or vomiting:  1. The medication in the patch is effective for 72 hours, after which it should be removed.  Wrap patch in a tissue and discard in the trash. Wash hands thoroughly with soap and water. 2. You may remove the patch earlier than 72 hours if you experience unpleasant side effects which may include dry mouth, dizziness or visual disturbances. 3. Avoid touching the patch. Wash your hands with soap and water after contact with the patch.

## 2018-03-09 NOTE — Op Note (Signed)
OPERATIVE REPORT  DATE OF OPERATION: 03/09/2018  PATIENT:  [name]  72 y.o. female  PRE-OPERATIVE DIAGNOSIS:  TEMPORAL ARTERITIS  POST-OPERATIVE DIAGNOSIS:  TEMPORAL ARTERITIS  INDICATION(S) FOR OPERATION:  Headaches and eye symptoms on the left side of her head primarily  FINDINGS:  Appeared to be normal anatomy and texture of the vessel  PROCEDURE:  Procedure(s): BIOPSY LEFT TEMPORAL ARTERY  SURGEON:  Surgeon(s): Judeth Horn, MD  ASSISTANT: None  ANESTHESIA:   local and MAC  COMPLICATIONS:  None  EBL: 3 ml  BLOOD ADMINISTERED: none  DRAINS: none   SPECIMEN:  Source of Specimen:  Left temporal artery  COUNTS CORRECT:  YES  PROCEDURE DETAILS: The patient was taken to the operating room and placed on the table in supine position.  After an adequate amount of IV sedation was given, she was prepped and draped in usual sterile manner with her head turned to the right on support exposing the preauricular area of the left side.  A proper timeout was performed identifying the patient and the procedure to be performed.  We were able to palpate the temporal artery on the left side just above the ear on the left side we marked the area.  We then injected 1% Xylocaine without epinephrine into the subcutaneous tissue along the line of the palpable pulse.  We subsequently made a 3 to 4 cm incision longitudinally from the left side using a #15 blade.  We dissected down to the subcu with electrocautery and also with tenotomy scissors.  We were able to find the temporal artery easily on superiorly subsequently lifted with 3-0 Vicryl suture.  We then dissected out a 3 cm segment of the artery, ligating its branches, then removing the screws in the medial segment for biopsy.  Both proximal distal ends were tied up with 3-0 Vicryl.  The sediment was sent off and a specimen cup wrapped in Telfa moistened with saline.  We closed in 2 layers.  The subcu was reapproximated using 5-0 Vicryl  and skin was closed using Dermabond.  Quarter inch Steri-Strips were used.  We also injected 3 cc of 0.25% Marcaine with epinephrine into the subcutaneous tissue prior to closure.  All needle counts, sponge counts, and instrument counts were correct.  PATIENT DISPOSITION:  PACU - hemodynamically stable.   Judeth Horn 10/3/201910:39 AM

## 2018-03-09 NOTE — Transfer of Care (Signed)
Immediate Anesthesia Transfer of Care Note  Patient: [name]  Procedure(s) Performed: BIOPSY LEFT TEMPORAL ARTERY (Left Head)  Patient Location: PACU  Anesthesia Type:MAC  Level of Consciousness: awake and alert   Airway & Oxygen Therapy: Patient Spontanous Breathing and Patient connected to face mask oxygen  Post-op Assessment: Report given to RN and Post -op Vital signs reviewed and stable  Post vital signs: Reviewed and stable  Last Vitals:  Vitals Value Taken Time  BP    Temp    Pulse    Resp    SpO2      Last Pain:  Vitals:   03/09/18 0910  TempSrc: Oral  PainSc: 0-No pain         Complications: No apparent anesthesia complications

## 2018-03-09 NOTE — Anesthesia Preprocedure Evaluation (Addendum)
Anesthesia Evaluation  Patient identified by MRN, date of birth, ID band Patient awake  General Assessment Comment:Had TIA 3 days post op cystocele repair  Reviewed: Allergy & Precautions, NPO status , Patient's Chart, lab work & pertinent test results  History of Anesthesia Complications Negative for: history of anesthetic complications  Airway Mallampati: II  TM Distance: >3 FB Neck ROM: Full    Dental  (+) Teeth Intact   Pulmonary neg pulmonary ROS,    breath sounds clear to auscultation       Cardiovascular (-) hypertension(-) angina(-) Past MI and (-) CHF  Rhythm:Regular Rate:Normal  Had pericarditis Nov. 2016 took colchicine for 3 months - resolved Last Echo 2015 LVEF 52-84%, Grade I diastolic dysfunction   Neuro/Psych  Headaches, Hit her head 2 weeks ago, seen by her neurologist who feels she is stable for planned GYN surgery TIAnegative psych ROS   GI/Hepatic negative GI ROS, Neg liver ROS,   Endo/Other  negative endocrine ROS  Renal/GU negative Renal ROS  negative genitourinary   Musculoskeletal  (+) Arthritis ,   Abdominal   Peds  Hematology negative hematology ROS (+)   Anesthesia Other Findings 2016 tte: Left ventricle: The cavity size was normal. Wall thickness was   normal. Systolic function was normal. The estimated ejection   fraction was in the range of 60% to 65%. Wall motion was normal;   there were no regional wall motion abnormalities. Doppler   parameters are consistent with abnormal left ventricular   relaxation (grade 1 diastolic dysfunction). - Aortic valve: There was no stenosis. - Mitral valve: There was no significant regurgitation. - Right ventricle: The cavity size was normal. Systolic function   was normal. - Tricuspid valve: Peak RV-RA gradient (S): 20 mm Hg. - Pulmonary arteries: PA peak pressure: 23 mm Hg (S). - Inferior vena cava: The vessel was normal in size. The    respirophasic diameter changes were in the normal range (>= 50%),   consistent with normal central venous pressure. - Pericardium, extracardiac: A trivial pericardial effusion was   identified.  Reproductive/Obstetrics Post Menopausal Bleeding Endometrial mass                            Anesthesia Physical Anesthesia Plan  ASA: II  Anesthesia Plan: MAC   Post-op Pain Management:    Induction: Intravenous  PONV Risk Score and Plan: 2 and Ondansetron and Treatment may vary due to age or medical condition  Airway Management Planned: Nasal Cannula  Additional Equipment: None  Intra-op Plan:   Post-operative Plan:   Informed Consent: I have reviewed the patients History and Physical, chart, labs and discussed the procedure including the risks, benefits and alternatives for the proposed anesthesia with the patient or authorized representative who has indicated his/her understanding and acceptance.   Dental advisory given  Plan Discussed with: CRNA  Anesthesia Plan Comments:         Anesthesia Quick Evaluation

## 2018-03-10 ENCOUNTER — Encounter (HOSPITAL_BASED_OUTPATIENT_CLINIC_OR_DEPARTMENT_OTHER): Payer: Self-pay | Admitting: General Surgery

## 2018-03-16 NOTE — Anesthesia Postprocedure Evaluation (Signed)
Anesthesia Post Note  Patient: [name]  Procedure(s) Performed: BIOPSY LEFT TEMPORAL ARTERY (Left Head)     Patient location during evaluation: PACU Anesthesia Type: MAC Level of consciousness: awake and alert Pain management: pain level controlled Vital Signs Assessment: post-procedure vital signs reviewed and stable Respiratory status: spontaneous breathing, nonlabored ventilation, respiratory function stable and patient connected to nasal cannula oxygen Cardiovascular status: stable and blood pressure returned to baseline Postop Assessment: no apparent nausea or vomiting Anesthetic complications: no    Last Vitals:  Vitals:   03/09/18 1100 03/09/18 1125  BP: 126/67 119/83  Pulse: 67 65  Resp: 20 18  Temp:  36.5 C  SpO2: 100% 98%    Last Pain:  Vitals:   03/09/18 1125  TempSrc:   PainSc: 0-No pain                 Brantlee Hinde

## 2018-03-20 ENCOUNTER — Other Ambulatory Visit: Payer: Medicare Other

## 2018-03-23 ENCOUNTER — Telehealth: Payer: Self-pay | Admitting: Neurology

## 2018-03-23 NOTE — Telephone Encounter (Signed)
Left message requesting a return call.

## 2018-03-23 NOTE — Telephone Encounter (Signed)
Please call patient, I did get her surgical pathology report, left temporal artery biopsy was unremarkable there is no evidence of temporal arteritis, please also check on her headaches,

## 2018-03-23 NOTE — Telephone Encounter (Addendum)
Spoke to patient - she is aware of her results.  States her headaches have greatly improved.  She estimates three headaches per week that last around one hour.  She tends to get mild, temporary relief with OTC Tylenol.  She has her MRI brain scheduled on 03/27/18.

## 2018-03-27 ENCOUNTER — Ambulatory Visit
Admission: RE | Admit: 2018-03-27 | Discharge: 2018-03-27 | Disposition: A | Discharge status: Home / Self Care | Payer: Medicare Other | Source: Ambulatory Visit | Attending: Neurology | Admitting: Neurology

## 2018-03-27 DIAGNOSIS — R51 Headache: Principal | ICD-10-CM

## 2018-03-27 DIAGNOSIS — R519 Headache, unspecified: Secondary | ICD-10-CM

## 2018-03-28 ENCOUNTER — Telehealth: Payer: Self-pay | Admitting: Neurology

## 2018-03-28 NOTE — Telephone Encounter (Signed)
Spoke to patient - she is aware of her MRI results.  She is still having frequent headaches.  She has an appt on 04/06/18 to further discuss her treatment plan with Dr. Terrace Arabia.

## 2018-03-28 NOTE — Telephone Encounter (Signed)
MRI of the brain showed age related generalized atrophy and mild supratentorium small vessel disease, there was no acute abnormality.  IMPRESSION: This MRI of the brain without contrast shows the following: 1.    Few scattered T2/FLAIR hyperintense foci in the hemispheres consistent with age-appropriate minimal chronic microvascular ischemic changes.    Brain volume is normal for age. 2.    There are no acute findings.

## 2018-04-03 ENCOUNTER — Telehealth: Payer: Self-pay | Admitting: Neurology

## 2018-04-03 DIAGNOSIS — R899 Unspecified abnormal finding in specimens from other organs, systems and tissues: Secondary | ICD-10-CM

## 2018-04-03 NOTE — Telephone Encounter (Signed)
I have ordered repeat ESR, CRP

## 2018-04-03 NOTE — Addendum Note (Signed)
Addended by: Levert Feinstein on: 04/03/2018 03:16 PM   Modules accepted: Orders

## 2018-04-03 NOTE — Telephone Encounter (Signed)
Patient has appointment with Dr. Terrace Arabia on 04-06-18 and would like to come in to have lab work done before appointment so it can be discussed at appointment. Please call and advise.

## 2018-04-03 NOTE — Telephone Encounter (Signed)
She is aware that lab orders have been placed.  She is still having good and bad days.  On her bad days, she has shooting pains in the back of her head, dizziness, difficulty focusing and weakness.  She will keep her appt this week to further discuss with Dr. Terrace Arabia.

## 2018-04-04 ENCOUNTER — Ambulatory Visit: Payer: Medicare Other | Admitting: Neurology

## 2018-04-04 ENCOUNTER — Other Ambulatory Visit (INDEPENDENT_AMBULATORY_CARE_PROVIDER_SITE_OTHER): Payer: Self-pay

## 2018-04-04 DIAGNOSIS — R899 Unspecified abnormal finding in specimens from other organs, systems and tissues: Secondary | ICD-10-CM

## 2018-04-04 DIAGNOSIS — Z0289 Encounter for other administrative examinations: Secondary | ICD-10-CM

## 2018-04-05 LAB — C-REACTIVE PROTEIN: CRP: 2 mg/L (ref 0–10)

## 2018-04-05 LAB — SEDIMENTATION RATE: Sed Rate: 92 mm/hr — ABNORMAL HIGH (ref 0–40)

## 2018-04-06 ENCOUNTER — Encounter: Payer: Self-pay | Admitting: Neurology

## 2018-04-06 ENCOUNTER — Ambulatory Visit: Payer: Medicare Other | Admitting: Neurology

## 2018-04-06 VITALS — BP 133/81 | HR 83 | Ht 66.0 in | Wt 187.5 lb

## 2018-04-06 DIAGNOSIS — G44209 Tension-type headache, unspecified, not intractable: Secondary | ICD-10-CM

## 2018-04-06 NOTE — Progress Notes (Addendum)
[facility]  PATIENT: [name] DOB: 1946-02-22  HISTORY OF PRESENT ILLNESS: [name] is a 72 years old right-handed African American female, accompanied by her husband, referred by her primary care physician Dr. Willey Blade for evaluation of left-sided headaches in March 2015.  She denies a previous history of migraine headaches, was very healthy, in October 18th 2014, at the hotel, close to midnight, she was trying to sit down at a rolling chair in front of the mirror but she mis-positioned herself, fell backwards on the floor, landed on her occipital region at the bathtub, there was no loss of consciousness, but she developed occipital area headaches afterwards, rest of the night, she was afraid of going to the sleep,  She was fine for 3 weeks, until early November 2014, she began to develop frequent, almost daily headaches, left parietal, left frontal, left retro-orbital region, 5 out of 7 days, the pain was moderate to severe, debilitating for her, lasting for a few hours, she has to take multiple doses of ibuprofen, she presented to the emergency room, CAT scan of the brain was normal, MRI of the brain was normal, She also complains of mild blurry vision, no gait difficulty, She is very hesitate about the potential side effect of the medication, she does not want to go on any preventive medications  UPDATE March 3rd 2015: She no longer has headaches, has been doing very well until 3 weeks ago, she woke up in the middle of the night using bathroom, felt sudden onset vertigo, gait difficulty, she was able to go back to bed, when she woke up next morning, she continued to felt vertigo, dizziness, unsteady gait, ever since the event, with sudden movement, she felt unsteadiness, no hearing loss, no tinnitus, no double vision,  UPDATE Aug 04 2015: Last clinical visit was in March 2015, she has been doing well until July 25 2015, she bumped forcefully into a  glass door, no loss of consciousness, but had persistent mild bilateral frontal headaches since the incident, overall has improved, she is planning on to have elective surgery under general anesthesia in August 08 2015, want to make sure she is fine to going through general anesthesia,  She also reported a "TIA"episode, when she presented with lateralized paresthesia, and transient weakness in 1988, she has no recollection of the detail anymore, we have personally reviewed MRI of the brain in November 2014, single isolated left periventricular gliosis. MRI of the brain and neck showed no large vessel disease,  UPDATE January 30 2018: On January 13, 2018, she was sitting in the swing, the canvas that covered the swing suddenly give out underneath her, she fell backwards, hit her occipital region on the iron bar, no loss of consciousness, she was able to walk back to home, then missed a stool in the kitchen, fell to the floor, no loss of consciousness, since then, she had low-grade bilateral frontal headaches, but with movement, she would develop left-sided severe pounding headache with light noise sensitivity, mild nauseous, for a while, she was taking Tylenol 2-3 times a day, presented to the emergency room on January 21, 2018 for persistent headaches, CT head without contrast showed mild atrophy supratentorium small vessel disease, there was no acute abnormality,  Her headache overall has some improvement, no significant headache when he sits still, with exertion, such as walking from the parking lot, she would develop mild left-sided headaches, no lateralized motor or sensory deficit,  She prefers not to take any preventive  medications  UPDATE Sept 26 2019: She was treated with prednisone tapering dose since beginning of September, starting from 75m daily for one week, then 185mdaily for one week, now has stopped at 5 mg daily, she did much better with higher dose of prednisone, pressure pain, foggy  sensation, lightheaded, taking Tylenol as needed,  Laboratory evaluation February 02, 2018, CBC showed mildly decreased WBC 3.2, hemoglobin of 11.6, MCH was decreased to 26, normal CPK 73, C-reactive protein 2, ESR was mildly elevated 78, TSH 0.53,  She has been seen by surgeon, but not has temporal artery biopsy yet  UPDATE Apr 06 2018: Left temporal artery biopsy was unremarkable  We personally reviewed MRI of the brain on March 27, 2018: Mild generalized atrophy, mild supratentorium small vessel disease, no acute abnormality.  Laboratory evaluation seen October 2019 showed elevated ESR, 92, with normal C-reactive protein,  She continues to have frequent intermittent sharp shooting pain at different spots in her skull, lasting for few seconds, occasionally pressure headache  She responded somewhat to prednisone treatment, but remains symptomatic even when she was receiving prednisone 20 mg daily, she does not want to take steroid again, she has significant tenderness along bilateral nuchal line, also offered trigger point injection, she does not want to have that now,  Her [facility] REVIEW OF SYSTEMS: Full 14 system review of systems performed and notable only for those listed, all others are neg:  Fatigue, dizziness, weakness, frequent awakening,  Rest review systems were negative.  ALLERGIES: No Known Allergies  HOME MEDICATIONS: Outpatient Medications Prior to Visit  Medication Sig Dispense Refill  . Ascorbic Acid (VITAMIN C) 1000 MG tablet Take 1,000 mg by mouth daily.    . Marland Kitchen complex vitamins tablet Take 1 tablet by mouth daily.    . Capsicum, Cayenne, (CAYENNE PO) Take 1 capsule by mouth daily.     . Cholecalciferol (VITAMIN D) 2000 units tablet Take 2,000 Units by mouth daily.    . COCONUT OIL PO Take 15 mLs by mouth daily.    . Docosahexaenoic Acid (DHA COMPLETE PO) Take 1 tablet by mouth daily.    .  Flaxseed, Linseed, (FLAXSEED OIL) 1000 MG CAPS Take by mouth.    . Marland KitchenARLIC PO Take 1 tablet by mouth 4 (four) times a week.    . Ginkgo Biloba (GINKOBA PO) Take 1 tablet by mouth daily.    . Glucosamine HCl (GLUCOSAMINE PO) Take 3 capsules by mouth daily.    . Marland KitchenAGNESIUM GLYCINATE PLUS PO Take 400 mg by mouth daily.     . NON FORMULARY Take 450 mg by mouth daily. Patient takes fennel    . Polyvinyl Alcohol-Povidone (REFRESH OP) Apply 1 drop to eye daily as needed (dry eyes).    . Probiotic Product (PROBIOTIC & ACIDOPHILUS EX ST PO) Take 1 tablet by mouth daily.     . Protein POWD Take 1 scoop by mouth daily. Mix with water    . Specialty Vitamins Products (ONE-A-DAY BONE STRENGTH PO) Take 3 tablets by mouth daily.    . Turmeric POWD Take 5 mLs by mouth daily.    . Marland KitchenNABLE TO FIND Med Name: MCT oil, one tablespoon per day     No facility-administered medications prior to visit.     PAST MEDICAL HISTORY: Past Medical History:  Diagnosis Date  . [place]  . Arthritis    bilateral knees  .  Chest pain   . Complication of anesthesia    see note about TIA-32 yrs ago  . Dizziness   . Head pain 07/25/15   recent blow to head  . History of concussion   . Pericarditis 04/2015  . TIA (transient ischemic attack) 1988   occurred three days after anesthesia    PAST SURGICAL HISTORY: Past Surgical History:  Procedure Laterality Date  . APPENDECTOMY    . ARTERY BIOPSY Left 03/09/2018   Procedure: BIOPSY LEFT TEMPORAL ARTERY;  Surgeon: Judeth Horn, MD;  Location: [facility];  Service: General;  Laterality: Left;  MAC  . CERVICAL CONE BIOPSY    . COLONOSCOPY    . CYSTOCELE REPAIR    . DILATATION & CURETTAGE/HYSTEROSCOPY WITH MYOSURE N/A 08/08/2015   Procedure: DILATATION & CURETTAGE/HYSTEROSCOPY WITH MYOSURE;  Surgeon: Servando Salina, MD;  Location: [facility];  Service: Gynecology;  Laterality: N/A;  . TONSILLECTOMY    . TUBAL LIGATION      FAMILY HISTORY: Family  History  Problem Relation Age of Onset  . Cancer Mother   . Heart disease Father   . Heart failure Father   . Coronary artery disease Unknown     SOCIAL HISTORY: Social History   Socioeconomic History  . Marital status: Married    Spouse name: Athelstan  . Number of children: 6  . Years of education: 45  . Highest education level: Master's degree (e.g., MA, MS, MEng, MEd, MSW, MBA)  Occupational History  . Occupation: Retired  Scientific laboratory technician  . Financial resource strain: Not on file  . Food insecurity:    Worry: Not on file    Inability: Not on file  . Transportation needs:    Medical: Not on file    Non-medical: Not on file  Tobacco Use  . Smoking status: Never Smoker  . Smokeless tobacco: Never Used  Substance and Sexual Activity  . Alcohol use: No  . Drug use: No  . Sexual activity: Yes    Birth control/protection: Post-menopausal  Lifestyle  . Physical activity:    Days per week: Not on file    Minutes per session: Not on file  . Stress: Not on file  Relationships  . Social connections:    Talks on phone: Not on file    Gets together: Not on file    Attends religious service: Not on file    Active member of club or organization: Not on file    Attends meetings of clubs or organizations: Not on file    Relationship status: Not on file  . Intimate partner violence:    Fear of current or ex partner: Not on file    Emotionally abused: Not on file    Physically abused: Not on file    Forced sexual activity: Not on file  Other Topics Concern  . Not on file  Social History Narrative   Patient is married Psychologist, counselling) and lives at home with her husband and her daughter.   Patient has five living children and one is deceased.   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   Patient is right handed.   Uses very little caffeine.     PHYSICAL EXAM  Vitals:   04/06/18 0846  BP: 133/81  Pulse: 83  Weight: 187 lb 8 oz (85 kg)  Height: _0  (1.676 m)    Body mass index is 30.26 kg/m.  Generalized: Well developed, in no acute distress , well-groomed  Head: normocephalic and atraumatic,. Oropharynx benign  Neck: Supple, no carotid bruits  Cardiac: Regular rate rhythm, no murmur  Musculoskeletal: No deformity   Neurological examination   Mentation: Alert oriented to time, place, history taking. MOCA 22/30.at last visit not repeated Follows all commands speech and language fluent.   Cranial nerve II-XII:  Pupils were small reactive to light extraocular movements were full, visual field were full on confrontational test. Facial sensation and strength were normal. hearing was intact to finger rubbing bilaterally. Uvula tongue midline. head turning and shoulder shrug were normal and symmetric.Tongue protrusion into cheek strength was normal. Motor: normal bulk and tone, full strength in the BUE, BLE, fine finger movements normal, no pronator drift. No focal weakness  Sensory: normal and symmetric to light touch, pinprick, and  Vibration, in the upper and lower extremities Coordination: finger-nose-finger, heel-to-shin bilaterally, no dysmetria Reflexes: Brachioradialis 2/2, biceps 2/2, triceps 2/2, patellar 2/2, Achilles 2/2, plantar responses were flexor bilaterally. Gait and Station: Rising up from seated position without assistance, normal stance,    DIAGNOSTIC DATA (LABS, IMAGING, TESTING)       Component Value Date/Time   NA 141 03/07/2018 1500   K 3.9 03/07/2018 1500   CL 109 03/07/2018 1500   CO2 23 03/07/2018 1500   GLUCOSE 71 03/07/2018 1500   BUN 11 03/07/2018 1500   CREATININE 0.84 03/07/2018 1500   CALCIUM 10.2 03/07/2018 1500   PROT 6.9 04/09/2014 0835   ALBUMIN 3.4 (L) 04/09/2014 0835   AST 21 04/09/2014 0835   ALT 11 04/09/2014 0835   ALKPHOS 69 04/09/2014 0835   BILITOT 0.4 04/09/2014 0835   GFRNONAA >60 03/07/2018 1500   GFRAA >60 03/07/2018 1500    ASSESSMENT AND PLAN  72 y.o. year old female  Headaches  since fall on January 13, 2018  Has migraine features, triggered by fall  No significant abnormality on CAT scan of the brain  She does not want any prescription preventive medications  Magnesium oxide 400 mg twice a day, riboflavin 100 mg twice a day as preventive medications,  May consider Aleve, Tylenol as needed,  ESR C-reactive protein was elevated, will repeat level again, if ESR remain elevated, will refer her for temporal artery biopsy to rule out temporal arteritis  She did respond well with short course of prednisone in September 2019, starting with 20 mg, tapered off to 5 mg in 3 weeks,  Marcial Pacas, M.D. Ph.D.  [facility] [place], [place] [address] Phone: [phone] Fax:      [phone]       [facility]  PATIENT: [name] DOB: [birth year]  HISTORY OF PRESENT ILLNESS: [name] is a 72 years old right-handed African American female, accompanied by her husband, referred by her primary care physician Dr. Willey Blade for evaluation of left-sided headaches in March 2015.  She denies a previous history of migraine headaches, was very healthy, in October 18th 2014, at the hotel, close to midnight, she was trying to sit down at a rolling chair in front of the mirror but she mis-positioned herself, fell backwards on the floor, landed on her occipital region at the bathtub, there was no loss of consciousness, but she developed occipital area headaches afterwards, rest of the night, she was afraid of going to the sleep,  She was fine for 3 weeks, until early November 2014, she began to develop frequent, almost daily headaches, left parietal, left frontal, left retro-orbital region, 5 out of 7 days, the pain  was moderate to severe, debilitating for her, lasting for a few hours, she has to take multiple doses of ibuprofen, she presented to the emergency room, CAT scan of the brain was normal, MRI of the brain was normal, She  also complains of mild blurry vision, no gait difficulty, She is very hesitate about the potential side effect of the medication, she does not want to go on any preventive medications  UPDATE March 3rd 2015: She no longer has headaches, has been doing very well until 3 weeks ago, she woke up in the middle of the night using bathroom, felt sudden onset vertigo, gait difficulty, she was able to go back to bed, when she woke up next morning, she continued to felt vertigo, dizziness, unsteady gait, ever since the event, with sudden movement, she felt unsteadiness, no hearing loss, no tinnitus, no double vision,  UPDATE Aug 04 2015: Last clinical visit was in March 2015, she has been doing well until July 25 2015, she bumped forcefully into a glass door, no loss of consciousness, but had persistent mild bilateral frontal headaches since the incident, overall has improved, she is planning on to have elective surgery under general anesthesia in August 08 2015, want to make sure she is fine to going through general anesthesia,  She also reported a "TIA"episode, when she presented with lateralized paresthesia, and transient weakness in 1988, she has no recollection of the detail anymore, we have personally reviewed MRI of the brain in November 2014, single isolated left periventricular gliosis. MRI of the brain and neck showed no large vessel disease,  UPDATE January 30 2018: On January 13, 2018, she was sitting in the swing, the canvas that covered the swing suddenly give out underneath her, she fell backwards, hit her occipital region on the iron bar, no loss of consciousness, she was able to walk back to home, then missed a stool in the kitchen, fell to the floor, no loss of consciousness, since then, she had low-grade bilateral frontal headaches, but with movement, she would develop left-sided severe pounding headache with light noise sensitivity, mild nauseous, for a while, she was taking Tylenol 2-3  times a day, presented to the emergency room on January 21, 2018 for persistent headaches, CT head without contrast showed mild atrophy supratentorium small vessel disease, there was no acute abnormality,  Her headache overall has some improvement, no significant headache when he sits still, with exertion, such as walking from the parking lot, she would develop mild left-sided headaches, no lateralized motor or sensory deficit,  She prefers not to take any preventive medications  UPDATE Sept 26 2019: She was treated with prednisone tapering dose since beginning of September, starting from 63m daily for one week, then 157mdaily for one week, now has stopped at 5 mg daily, she did much better with higher dose of prednisone, pressure pain, foggy sensation, lightheaded, taking Tylenol as needed,  Laboratory evaluation February 02, 2018, CBC showed mildly decreased WBC 3.2, hemoglobin of 11.6, MCH was decreased to 26, normal CPK 73, C-reactive protein 2, ESR was mildly elevated 78, TSH 0.53,  She has been seen by surgeon, but not has temporal artery biopsy yet  REVIEW OF SYSTEMS: Full 14 system review of systems performed and notable only for those listed, all others are neg:  Fatigue, eye pain, blurry vision, excessive eating, all rest of review of the system were negative  ALLERGIES: No Known Allergies  HOME MEDICATIONS: Outpatient Medications Prior to Visit  Medication Sig Dispense  Refill  . Ascorbic Acid (VITAMIN C) 1000 MG tablet Take 1,000 mg by mouth daily.    Marland Kitchen b complex vitamins tablet Take 1 tablet by mouth daily.    . Capsicum, Cayenne, (CAYENNE PO) Take 1 capsule by mouth daily.     . Cholecalciferol (VITAMIN D) 2000 units tablet Take 2,000 Units by mouth daily.    . COCONUT OIL PO Take 15 mLs by mouth daily.    . Docosahexaenoic Acid (DHA COMPLETE PO) Take 1 tablet by mouth daily.    . Flaxseed, Linseed, (FLAXSEED OIL) 1000 MG CAPS Take by mouth.    Marland Kitchen GARLIC PO Take 1 tablet by  mouth 4 (four) times a week.    . Ginkgo Biloba (GINKOBA PO) Take 1 tablet by mouth daily.    . Glucosamine HCl (GLUCOSAMINE PO) Take 3 capsules by mouth daily.    Marland Kitchen MAGNESIUM GLYCINATE PLUS PO Take 400 mg by mouth daily.     . NON FORMULARY Take 450 mg by mouth daily. Patient takes fennel    . Polyvinyl Alcohol-Povidone (REFRESH OP) Apply 1 drop to eye daily as needed (dry eyes).    . Probiotic Product (PROBIOTIC & ACIDOPHILUS EX ST PO) Take 1 tablet by mouth daily.     . Protein POWD Take 1 scoop by mouth daily. Mix with water    . Specialty Vitamins Products (ONE-A-DAY BONE STRENGTH PO) Take 3 tablets by mouth daily.    . Turmeric POWD Take 5 mLs by mouth daily.    Marland Kitchen UNABLE TO FIND Med Name: MCT oil, one tablespoon per day     No facility-administered medications prior to visit.     PAST MEDICAL HISTORY: Past Medical History:  Diagnosis Date  . Anginal pain ([place])   . Arthritis    bilateral knees  . Chest pain   . Complication of anesthesia    see note about TIA-32 yrs ago  . Dizziness   . Head pain 07/25/15   recent blow to head  . History of concussion   . Pericarditis 04/2015  . TIA (transient ischemic attack) 1988   occurred three days after anesthesia    PAST SURGICAL HISTORY: Past Surgical History:  Procedure Laterality Date  . APPENDECTOMY    . ARTERY BIOPSY Left 03/09/2018   Procedure: BIOPSY LEFT TEMPORAL ARTERY;  Surgeon: Judeth Horn, MD;  Location: [facility];  Service: General;  Laterality: Left;  MAC  . CERVICAL CONE BIOPSY    . COLONOSCOPY    . CYSTOCELE REPAIR    . DILATATION & CURETTAGE/HYSTEROSCOPY WITH MYOSURE N/A 08/08/2015   Procedure: DILATATION & CURETTAGE/HYSTEROSCOPY WITH MYOSURE;  Surgeon: Servando Salina, MD;  Location: [facility];  Service: Gynecology;  Laterality: N/A;  . TONSILLECTOMY    . TUBAL LIGATION      FAMILY HISTORY: Family History  Problem Relation Age of Onset  . Cancer Mother   . Heart disease Father   . Heart  failure Father   . Coronary artery disease Unknown     SOCIAL HISTORY: Social History   Socioeconomic History  . Marital status: Married    Spouse name: Athelstan  . Number of children: 6  . Years of education: 42  . Highest education level: Master's degree (e.g., MA, MS, MEng, MEd, MSW, MBA)  Occupational History  . Occupation: Retired  Scientific laboratory technician  . Financial resource strain: Not on file  . Food insecurity:    Worry: Not on file    Inability: Not  on file  . Transportation needs:    Medical: Not on file    Non-medical: Not on file  Tobacco Use  . Smoking status: Never Smoker  . Smokeless tobacco: Never Used  Substance and Sexual Activity  . Alcohol use: No  . Drug use: No  . Sexual activity: Yes    Birth control/protection: Post-menopausal  Lifestyle  . Physical activity:    Days per week: Not on file    Minutes per session: Not on file  . Stress: Not on file  Relationships  . Social connections:    Talks on phone: Not on file    Gets together: Not on file    Attends religious service: Not on file    Active member of club or organization: Not on file    Attends meetings of clubs or organizations: Not on file    Relationship status: Not on file  . Intimate partner violence:    Fear of current or ex partner: Not on file    Emotionally abused: Not on file    Physically abused: Not on file    Forced sexual activity: Not on file  Other Topics Concern  . Not on file  Social History Narrative   Patient is married Psychologist, counselling) and lives at home with her husband and her daughter.   Patient has five living children and one is deceased.   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   Patient is right handed.   Uses very little caffeine.     PHYSICAL EXAM  Vitals:   04/06/18 0846  BP: 133/81  Pulse: 83  Weight: 187 lb 8 oz (85 kg)  Height: _0  (1.676 m)   Body mass index is 30.26 kg/m.  Generalized: Well developed, in no acute distress ,  well-groomed Head: normocephalic and atraumatic,. Oropharynx benign  Neck: Supple, no carotid bruits  Cardiac: Regular rate rhythm, no murmur  Musculoskeletal: No deformity   Neurological examination   Mentation: Alert oriented to time, place, history taking. MOCA 22/30.at last visit not repeated Follows all commands speech and language fluent.   Cranial nerve II-XII:  Pupils were small reactive to light extraocular movements were full, visual field were full on confrontational test. Facial sensation and strength were normal. hearing was intact to finger rubbing bilaterally. Uvula tongue midline. head turning and shoulder shrug were normal and symmetric.Tongue protrusion into cheek strength was normal. Motor: normal bulk and tone, full strength in the BUE, BLE,  Sensory: normal and symmetric to light touch, pinprick, and  Vibration, in the upper and lower extremities Coordination: finger-nose-finger, heel-to-shin bilaterally, no dysmetria Reflexes: Brachioradialis 2/2, biceps 2/2, triceps 2/2, patellar 2/2, Achilles 2/2, plantar responses were flexor bilaterally. Gait and Station: Rising up from seated position without assistance, normal stance,   DIAGNOSTIC DATA (LABS, IMAGING, TESTING)       Component Value Date/Time   NA 141 03/07/2018 1500   K 3.9 03/07/2018 1500   CL 109 03/07/2018 1500   CO2 23 03/07/2018 1500   GLUCOSE 71 03/07/2018 1500   BUN 11 03/07/2018 1500   CREATININE 0.84 03/07/2018 1500   CALCIUM 10.2 03/07/2018 1500   PROT 6.9 04/09/2014 0835   ALBUMIN 3.4 (L) 04/09/2014 0835   AST 21 04/09/2014 0835   ALT 11 04/09/2014 0835   ALKPHOS 69 04/09/2014 0835   BILITOT 0.4 04/09/2014 0835   GFRNONAA >60 03/07/2018 1500   GFRAA >60 03/07/2018 1500    ASSESSMENT AND PLAN  72 y.o. year old female  Headaches since fall on January 13, 2018  Has migraine features, triggered by fall  No significant abnormality on CAT scan and MRI of brain  She does not want any  prescription preventive medications  Magnesium oxide 400 mg twice a day, riboflavin 100 mg twice a day as preventive medications,  May consider Aleve, Tylenol as needed,  ESR was elevated with normal CRP  Left temporal artery biopsy was negative.      She did respond somewhat with short course of prednisone in September 2019, starting with 20 mg, tapered off to 5 mg in 3 weeks, does not want to have repeat injections again  Significant tenderness of bilateral nuchal line, does not want to receive trigger point injection, I have suggested heating pad, massage  Face to face time was 25 minutes, greater than 50% of the time was spent in counseling and coordination of care with the patient.     Marcial Pacas, M.D. Ph.D.  [facility] [place], [place] [address] Phone: [phone] Fax:      [phone]

## 2018-04-13 ENCOUNTER — Telehealth: Payer: Self-pay | Admitting: Neurology

## 2018-04-13 DIAGNOSIS — R899 Unspecified abnormal finding in specimens from other organs, systems and tissues: Secondary | ICD-10-CM

## 2018-04-13 NOTE — Telephone Encounter (Signed)
Returned call to patient - states Dr. Terrace Arabia spoke to her about a rheumatology referral at her last appt.  She would like to move forward with this recommendation.  Ok, per vo by Dr. Terrace Arabia, to place order in Epic.   The patient is aware to expect a call to be scheduled.

## 2018-04-13 NOTE — Telephone Encounter (Signed)
Pt requesting a call stating she would like to discuss a referral for her on going symptoms

## 2018-04-17 ENCOUNTER — Telehealth: Payer: Self-pay | Admitting: *Deleted

## 2018-04-17 NOTE — Telephone Encounter (Signed)
Referral made to Dr. Corliss Skains was declined by her office.  Per vo by Dr. Terrace Arabia, provide patient's lab results to her PCP.  The PCP may continue to monitor and may choose to attempt a referral to another rheumatologist.  Dr. Terrace Arabia feels the referral may be better received from her PCP office.  Patient is aware and will follow up with primary care.

## 2018-10-05 ENCOUNTER — Telehealth: Payer: Self-pay | Admitting: *Deleted

## 2018-10-05 NOTE — Telephone Encounter (Signed)
REFERRAL SENT TO SCHEDULING AND NOTES ON FILE FROM DR. Andi Devon [phone]

## 2018-10-10 ENCOUNTER — Telehealth: Payer: Self-pay | Admitting: Cardiology

## 2018-10-10 NOTE — Telephone Encounter (Signed)
Virtual Visit Pre-Appointment Phone Call  "(Name), I am calling you today to discuss your upcoming appointment. We are currently trying to limit exposure to the virus that causes COVID-19 by seeing patients at home rather than in the office."  1. "What is the BEST phone number to call the day of the visit?" - include this in appointment notes  2. Do you have or have access to (through a family member/friend) a smartphone with video capability that we can use for your visit?" a. If yes - list this number in appt notes as cell (if different from BEST phone #) and list the appointment type as a VIDEO visit in appointment notes b. If no - list the appointment type as a PHONE visit in appointment notes  Confirm consent - "In the setting of the current Covid19 crisis, you are scheduled for a (phone or video) visit with your provider on (date) at (time).  Just as we do with many in-office visits, in order for you to participate in this visit, we must obtain consent.  If you'd like, I can send this to your mychart (if signed up) or email for you to review.  Otherwise, I can obtain your verbal consent now.  All virtual visits are billed to your insurance company just like a normal visit would be.  By agreeing to a virtual visit, we'd like you to understand that the technology does not allow for your provider to perform an examination, and thus may limit your provider's ability to fully assess your condition. If your provider identifies any concerns that need to be evaluated in person, we will make arrangements to do so.  Finally, though the technology is pretty good, we cannot assure that it will always work on either your or our end, and in the setting of a video visit, we may have to convert it to a phone-only visit.  In either situation, we cannot ensure that we have a secure connection.  Are you willing to proceed?"  Yes/new patient/telephone only at [phone] consent 5.5.20/  3. Advise  patient to be prepared - "Two hours prior to your appointment, go ahead and check your blood pressure, pulse, oxygen saturation, and your weight (if you have the equipment to check those) and write them all down. When your visit starts, your provider will ask you for this information. If you have an Apple Watch or Kardia device, please plan to have heart rate information ready on the day of your appointment. Please have a pen and paper handy nearby the day of the visit as well."  4. Give patient instructions for MyChart download to smartphone OR Doximity/Doxy.me as below if video visit (depending on what platform provider is using)  5. Inform patient they will receive a phone call 15 minutes prior to their appointment time (may be from unknown caller ID) so they should be prepared to answer    TELEPHONE CALL NOTE  [name] has been deemed a candidate for a follow-up tele-health visit to limit community exposure during the Covid-19 pandemic. I spoke with the patient via phone to ensure availability of phone/video source, confirm preferred email & phone number, and discuss instructions and expectations.  I reminded [name] to be prepared with any vital sign and/or heart rhythm information that could potentially be obtained via home monitoring, at the time of her visit. I reminded [name] to expect a phone call prior to her visit.  Pricilla HolmSharon B Ferguson 10/10/2018 3:20 PM  INSTRUCTIONS FOR DOWNLOADING THE MYCHART APP TO SMARTPHONE  - The patient must first make sure to have activated MyChart and know their login information - If Apple, go to [employer] and type in MyChart in the search bar and download the app. If Android, ask patient to go to [employer] and type in [place] in the search bar and download the app. The app is free but as with any other app downloads, their phone may require them to verify saved payment information or Apple/Android password.  - The patient will  need to then log into the app with their MyChart username and password, and select [facility] as their healthcare provider to link the account. When it is time for your visit, go to the MyChart app, find appointments, and click Begin Video Visit. Be sure to Select Allow for your device to access the Microphone and Camera for your visit. You will then be connected, and your provider will be with you shortly.  **If they have any issues connecting, or need assistance please contact MyChart service desk [phone])**  **If using a computer, in order to ensure the best quality for their visit they will need to use either of the following Internet Browsers: [employer] or Google Chrome**  IF USING DOXIMITY or DOXY.ME - The patient will receive a link just prior to their visit by text.     FULL LENGTH CONSENT FOR TELE-HEALTH VISIT   I hereby voluntarily request, consent and authorize [facility] and its employed or contracted physicians, physician assistants, nurse practitioners or other licensed health care professionals (the Practitioner), to provide me with telemedicine health care services (the Services") as deemed necessary by the treating Practitioner. I acknowledge and consent to receive the Services by the Practitioner via telemedicine. I understand that the telemedicine visit will involve communicating with the Practitioner through live audiovisual communication technology and the disclosure of certain medical information by electronic transmission. I acknowledge that I have been given the opportunity to request an in-person assessment or other available alternative prior to the telemedicine visit and am voluntarily participating in the telemedicine visit.  I understand that I have the right to withhold or withdraw my consent to the use of telemedicine in the course of my care at any time, without affecting my right to future care or treatment, and that the Practitioner or I  may terminate the telemedicine visit at any time. I understand that I have the right to inspect all information obtained and/or recorded in the course of the telemedicine visit and may receive copies of available information for a reasonable fee.  I understand that some of the potential risks of receiving the Services via telemedicine include:   Delay or interruption in medical evaluation due to technological equipment failure or disruption;  Information transmitted may not be sufficient (e.g. poor resolution of images) to allow for appropriate medical decision making by the Practitioner; and/or   In rare instances, security protocols could fail, causing a breach of personal health information.  Furthermore, I acknowledge that it is my responsibility to provide information about my medical history, conditions and care that is complete and accurate to the best of my ability. I acknowledge that Practitioner's advice, recommendations, and/or decision may be based on factors not within their control, such as incomplete or inaccurate data provided by me or distortions of diagnostic images or specimens that may result from electronic transmissions. I understand that the practice of medicine is not an exact science  and that Practitioner makes no warranties or guarantees regarding treatment outcomes. I acknowledge that I will receive a copy of this consent concurrently upon execution via email to the email address I last provided but may also request a printed copy by calling the office of [facility].    I understand that my insurance will be billed for this visit.   I have read or had this consent read to me.  I understand the contents of this consent, which adequately explains the benefits and risks of the Services being provided via telemedicine.   I have been provided ample opportunity to ask questions regarding this consent and the Services and have had my questions answered to my satisfaction.  I  give my informed consent for the services to be provided through the use of telemedicine in my medical care  By participating in this telemedicine visit I agree to the above.

## 2018-10-11 ENCOUNTER — Telehealth: Payer: Medicare Other | Admitting: Cardiology

## 2018-10-12 ENCOUNTER — Encounter: Payer: Self-pay | Admitting: Cardiology

## 2018-10-12 ENCOUNTER — Other Ambulatory Visit: Payer: Self-pay

## 2018-10-12 ENCOUNTER — Telehealth (INDEPENDENT_AMBULATORY_CARE_PROVIDER_SITE_OTHER): Payer: Medicare Other | Admitting: Cardiology

## 2018-10-12 VITALS — BP 172/81 | HR 60 | Ht 66.0 in | Wt 173.0 lb

## 2018-10-12 DIAGNOSIS — R079 Chest pain, unspecified: Secondary | ICD-10-CM

## 2018-10-12 DIAGNOSIS — R030 Elevated blood-pressure reading, without diagnosis of hypertension: Secondary | ICD-10-CM | POA: Diagnosis not present

## 2018-10-12 DIAGNOSIS — Z7189 Other specified counseling: Secondary | ICD-10-CM

## 2018-10-12 DIAGNOSIS — Z8679 Personal history of other diseases of the circulatory system: Secondary | ICD-10-CM | POA: Diagnosis not present

## 2018-10-12 NOTE — Progress Notes (Signed)
Virtual Visit via Telephone Note   This visit type was conducted due to national recommendations for restrictions regarding the COVID-19 Pandemic (e.g. social distancing) in an effort to limit this patient's exposure and mitigate transmission in our community.  Due to her co-morbid illnesses, this patient is at least at moderate risk for complications without adequate follow up.  This format is felt to be most appropriate for this patient at this time.  The patient did not have access to video technology/had technical difficulties with video requiring transitioning to audio format only (telephone).  All issues noted in this document were discussed and addressed.  No physical exam could be performed with this format.  Please refer to the patient's chart for her  consent to telehealth for [facility].  Evaluation Performed:  Follow-up visit  This visit type was conducted due to national recommendations for restrictions regarding the COVID-19 Pandemic (e.g. social distancing).  This format is felt to be most appropriate for this patient at this time.  All issues noted in this document were discussed and addressed.  No physical exam was performed (except for noted visual exam findings with Video Visits).  Please refer to the patient's chart (MyChart message for video visits and phone note for telephone visits) for the patient's consent to telehealth for [facility].  Date:  10/12/2018   ID:  [name], DOB 10-Jan-1946, MRN [id number]  Patient Location:  Home  Provider location:   [place]  PCP:  Willey Blade, MD  Cardiologist:  NEW Electrophysiologist:  None   Chief Complaint:  Chest pain  History of Present Illness:    [name] is a 73 y.o. female who presents via audio/video conferencing for a telehealth visit today in referral from Willey Blade, MD for evaluation of chest pain.    This is a 73yo female with a history of pericarditis remotely and TIA who is here  for evaluation of chest pain.   Se has a hx of pericarditis years ago and was hospitalized for a while and then was treated with medication.  Now the pain is back over the past few months.  She describes it as a tightness in the midsternal area of her chest.  There is no radiation of the discomfort. She tells me that for the past few weeks it has resolved.  It is off and on in occurrence and says that she has a lot of stress in her life.  The discomfort is similar but not the same as what she had with her pericarditis.  It was worse when she would lay supine.  She denies any diaphoresis or nausea with the discomfort.   Rarely she will have some mild DOE.  She has been sheltering in place for [place] but has been trying to get out walking and her symptoms have improved. She does occasionally have the discomfort when walking and will have some DOE when walking.  She denies any LE edema, PND, orthopnea, dizziness, palpitations or syncope.   She does not smoke but her father had CAD.    The patient does not have symptoms concerning for COVID-19 infection (fever, chills, cough, or new shortness of breath).    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   Pericarditis 04/2015   TIA (transient ischemic attack) 1988   occurred three days after anesthesia   Vertigo 08/07/2013   Past Surgical History:  Procedure Laterality Date   APPENDECTOMY     ARTERY BIOPSY Left 03/09/2018   Procedure: BIOPSY LEFT TEMPORAL ARTERY;  Surgeon: Judeth Horn, MD;  Location: [facility];  Service: General;  Laterality: Left;  MAC   CERVICAL CONE BIOPSY      COLONOSCOPY     Gross N/A 08/08/2015   Procedure: [place];  Surgeon: Servando Salina, MD;  Location: [facility];  Service: Gynecology;  Laterality: N/A;   TONSILLECTOMY     TUBAL LIGATION       Current Meds  Medication Sig   Ascorbic Acid (VITAMIN C) 1000 MG tablet Take 1,000 mg by mouth daily.   b complex vitamins tablet Take 1 tablet by mouth daily.   BLACK CURRANT SEED OIL PO Take 1/2 tsp by mouth twice a day   Cholecalciferol (VITAMIN D) 2000 units tablet Take 2,000 Units by mouth daily.   COCONUT OIL PO Take 15 mLs by mouth daily.   Docosahexaenoic Acid (DHA COMPLETE PO) Take 1 tablet by mouth daily.   fluorometholone (FML) 0.1 % ophthalmic suspension Place 1 drop into the left eye. Four times a day for 5 days and then twice a day for 5 days and then stop.   GARLIC PO Take 1 tablet by mouth 4 (four) times a week.   Ginkgo Biloba (GINKOBA PO) Take 1 tablet by mouth daily.   Glucosamine HCl (GLUCOSAMINE PO) Take 3 capsules by mouth daily.   MAGNESIUM GLYCINATE PLUS PO Take 400 mg by mouth daily.    NON FORMULARY Take 450 mg by mouth daily. Patient takes fennel   Polyvinyl Alcohol-Povidone (REFRESH OP) Apply 1 drop to eye daily as needed (dry eyes).   Probiotic Product (PROBIOTIC & ACIDOPHILUS EX ST PO) Take 1 tablet by mouth daily.    Protein POWD Take 1 scoop by mouth daily. Mix with water   Specialty Vitamins Products (ONE-A-DAY BONE STRENGTH PO) Take 3 tablets by mouth daily.   UNABLE TO FIND Med Name: MCT oil, one tablespoon per day     Allergies:   Patient has no known allergies.   Social History   Tobacco Use   Smoking status: Never Smoker   Smokeless tobacco: Never Used  Substance Use Topics   Alcohol use: No   Drug use: No     Family Hx: The patient's family history includes Cancer in her mother; Coronary artery disease in an other family  member; Heart disease in her father; Heart failure in her father.  ROS:   Please see the history of present illness.     All other systems reviewed and are negative.   Labs/Other Tests and Data Reviewed:    Recent Labs: 01/30/2018: TSH 0.530 03/02/2018: Hemoglobin 12.6; Platelets 358 03/07/2018: BUN 11; Creatinine, Ser 0.84; Potassium 3.9; Sodium 141   Recent Lipid Panel Lab Results  Component Value Date/Time   CHOL 124 04/09/2014 08:35 AM   TRIG 32 04/09/2014 08:35 AM   HDL 62 04/09/2014 08:35 AM   CHOLHDL 2.0 04/09/2014 08:35 AM   LDLCALC 56 04/09/2014 08:35 AM    Wt  Readings from Last 3 Encounters:  10/12/18 173 lb (78.5 kg)  04/06/18 187 lb 8 oz (85 kg)  03/09/18 184 lb 11.9 oz (83.8 kg)     Objective:    Vital Signs:  BP (!) 172/81    Pulse 60    Ht _0  (1.676 m)    Wt 173 lb (78.5 kg)    BMI 27.92 kg/m    CONSTITUTIONAL:  Well nourished, well developed female in no acute distress.  EYES: anicteric MOUTH: oral mucosa is pink RESPIRATORY: Normal respiratory effort, symmetric expansion CARDIOVASCULAR: No peripheral edema SKIN: No rash, lesions or ulcers MUSCULOSKELETAL: no digital cyanosis NEURO: Cranial Nerves II-XII grossly intact, moves all extremities PSYCH: Intact judgement and insight.  A&O x 3, Mood/affect appropriate   ASSESSMENT & PLAN:    1.  Chest pain -her chest pain is somewhat atypical in that it is more prominent at night and actually after she started getting out and walking she says the chest pain has significantly improved and almost resolved.  She would notice it some when she would be out walking with some mild shortness of breath initially but she says as the days gone by this discomfort has gotten better.  She has been under a lot of stress because of COVID-19.  She denies any associated symptoms of nausea or diaphoresis with the pain and there is no radiation of the pain.  She does have risk factors and that her father had CAD but she has  never smoked.  She says her pain may be slightly like it was when she had pericarditis but for the most part is not similar.  I have recommended getting a 2D echocardiogram to assess for pericardial effusion as well as Lexiscan Myoview to rule out ischemia.  2.  H/O pericarditis  -she had pericarditis several years ago and was hospitalized for a short period of time and was treated with medication and this resolved and she has not had any problems with it since.  3.  Elevated BP - this was done on her husbands cuff and she could not get the cuff on her arm to fit tightly because her arms are so small and likely a false reading  She has never had high BP and it was normal at her last OV at 100/55mHg.   3.  COVID-19 Education:The signs and symptoms of COVID-19 were discussed with the patient and how to seek care for testing (follow up with PCP or arrange E-visit).  The importance of social distancing was discussed today.  Patient Risk:   After full review of this patient's clinical status, I feel that they are at least moderate risk at this time.  Time:   Today, I have spent 20 minutes directly with the patient on telephone discussing medical problems including chest pain, prior pericarditis, BP readings, SOB.  We also reviewed the symptoms of COVID 19 and the ways to protect against contracting the virus with telehealth technology.  I spent an additional 5 minutes reviewing patient's chart including PCP OV notes.  Medication Adjustments/Labs and Tests Ordered: Current medicines are reviewed at length with the patient today.  Concerns regarding medicines are outlined above.  Tests Ordered: No orders of the defined types were placed in this encounter.  Medication Changes: No orders of the defined types were placed in this encounter.   Disposition:  Follow up prn  Signed, TFransico Him MD  10/12/2018 9:10 AM    [place]

## 2018-10-12 NOTE — Patient Instructions (Signed)
Medication Instructions:  Your physician recommends that you continue on your current medications as directed. Please refer to the Current Medication list given to you today.  If you need a refill on your cardiac medications before your next appointment, please call your pharmacy.   Lab work: None If you have labs (blood work) drawn today and your tests are completely normal, you will receive your results only by: Marland Kitchen MyChart Message (if you have MyChart) OR . A paper copy in the mail If you have any lab test that is abnormal or we need to change your treatment, we will call you to review the results.  Testing/Procedures: Your physician has requested that you have an echocardiogram in the next 2 weeks. Echocardiography is a painless test that uses sound waves to create images of your heart. It provides your doctor with information about the size and shape of your heart and how well your heart's chambers and valves are working. This procedure takes approximately one hour. There are no restrictions for this procedure.  Your physician has requested that you have a lexiscan myoview in the next 2 weeks. For further information please visit [email or web address] Please follow instruction sheet, as given.  Follow-Up: As needed, pending results.

## 2018-11-20 ENCOUNTER — Other Ambulatory Visit (HOSPITAL_COMMUNITY): Payer: Medicare Other

## 2018-11-20 ENCOUNTER — Encounter (HOSPITAL_COMMUNITY): Payer: Medicare Other

## 2018-12-06 ENCOUNTER — Telehealth (HOSPITAL_COMMUNITY): Payer: Self-pay | Admitting: *Deleted

## 2018-12-06 NOTE — Telephone Encounter (Signed)
Spoke with husband.  He said he would have patient call us back for instructions.

## 2018-12-11 ENCOUNTER — Encounter (HOSPITAL_COMMUNITY): Payer: Medicare Other

## 2018-12-11 ENCOUNTER — Other Ambulatory Visit (HOSPITAL_COMMUNITY): Payer: Medicare Other

## 2018-12-12 ENCOUNTER — Telehealth (HOSPITAL_COMMUNITY): Payer: Self-pay | Admitting: Radiology

## 2018-12-12 NOTE — Telephone Encounter (Signed)
Patient given detailed instructions per Myocardial Perfusion Study Information Sheet for the test on 12/20/2018 at 11:00. Patient notified to arrive 15 minutes early and that it is imperative to arrive on time for appointment to keep from having the test rescheduled.  If you need to cancel or reschedule your appointment, please call the office within 24 hours of your appointment. . Patient verbalized understanding.EHK

## 2018-12-18 ENCOUNTER — Telehealth (HOSPITAL_COMMUNITY): Payer: Self-pay | Admitting: *Deleted

## 2018-12-18 NOTE — Telephone Encounter (Signed)
Patient given detailed instructions per Myocardial Perfusion Study Information Sheet for the test on 12/20/18. Patient notified to arrive 15 minutes early and that it is imperative to arrive on time for appointment to keep from having the test rescheduled.  If you need to cancel or reschedule your appointment, please call the office within 24 hours of your appointment. . Patient verbalized understanding. [name] Jacqueline    

## 2018-12-20 ENCOUNTER — Ambulatory Visit (HOSPITAL_BASED_OUTPATIENT_CLINIC_OR_DEPARTMENT_OTHER): Payer: Medicare Other

## 2018-12-20 ENCOUNTER — Ambulatory Visit (HOSPITAL_COMMUNITY): Payer: Medicare Other | Attending: Cardiology

## 2018-12-20 ENCOUNTER — Encounter (HOSPITAL_COMMUNITY): Payer: Self-pay

## 2018-12-20 ENCOUNTER — Other Ambulatory Visit: Payer: Self-pay

## 2018-12-20 DIAGNOSIS — R079 Chest pain, unspecified: Secondary | ICD-10-CM

## 2018-12-20 LAB — ECHOCARDIOGRAM COMPLETE
Height: 66 in
Weight: 2768 oz

## 2018-12-20 LAB — MYOCARDIAL PERFUSION IMAGING
LV dias vol: 56 mL (ref 46–106)
LV sys vol: 22 mL
Peak HR: 93 {beats}/min
Rest HR: 55 {beats}/min
SRS: 0
SSS: 1
TID: 0.87

## 2018-12-20 MED ORDER — TECHNETIUM TC 99M TETROFOSMIN IV KIT
31.5000 | PACK | Freq: Once | INTRAVENOUS | Status: AC | PRN
  Administered 2018-12-20: 31.5 via INTRAVENOUS
  Filled 2018-12-20: qty 32

## 2018-12-20 MED ORDER — TECHNETIUM TC 99M TETROFOSMIN IV KIT
9.9000 | PACK | Freq: Once | INTRAVENOUS | Status: AC | PRN
  Administered 2018-12-20: 9.9 via INTRAVENOUS
  Filled 2018-12-20: qty 10

## 2018-12-20 MED ORDER — REGADENOSON 0.4 MG/5ML IV SOLN
0.4000 mg | Freq: Once | INTRAVENOUS | Status: AC
  Administered 2018-12-20: 0.4 mg via INTRAVENOUS

## 2018-12-25 ENCOUNTER — Telehealth: Payer: Self-pay | Admitting: *Deleted

## 2018-12-25 NOTE — Telephone Encounter (Signed)
-----   Message from Nuala Alpha, LPN sent at 7/71/1657  9:07 AM EDT -----  ----- Message ----- From: Sarina Ill, RN Sent: 12/22/2018   5:09 PM EDT To: [address] Triage   ----- Message ----- From: Sueanne Margarita, MD Sent: 12/20/2018   4:09 PM EDT To: Sarina Ill, RN  Please let patient know that stress test was fine

## 2018-12-25 NOTE — Telephone Encounter (Signed)
Pt has been notified of both echo and Myoview results by phone with verbal understanding. Pt thanked me for the call. Pt did request copy of test to be mailed to her as well. I have verified her address today by phone. I will forward results to PCP Dr. Willey Blade. The patient has been notified of the result and verbalized understanding.  All questions (if any) were answered. Julaine Hua, [facility] 12/25/2018 12:33 PM

## 2019-01-28 NOTE — Progress Notes (Addendum)
[facility] Note  01/29/2019     CHIEF COMPLAINT Patient presents for Retina Evaluation   HISTORY OF PRESENT ILLNESS: [name] is a 73 y.o. female who presents to the clinic today for:   HPI    Retina Evaluation    In left eye.  This started months ago.  Duration of months.  Associated Symptoms Flashes and Floaters.  Context:  distance vision and near vision.  I, the attending physician,  performed the HPI with the patient and updated documentation appropriately.          Comments    New patient retina eval (iritis OS) Patient states her vision has remained stable in both eyes over the course of recurrent iritis.  Patient states she had her first case of iritis in May of 2020 and since this occurrence, she has had multiple cases leading up to today's visit.  Patient had an episode OS last week and went to see Dr. Kathlen Mody once again for treatment--patient was initially using Loteprednol 6x/day OS and has since reduced to BID OS per Dr. Kathleen Argue recommendation.  Patient reports that she does miss an occasional dose of Loteprednol.  Patient states she has had extensive lab work but that at the time of labs, they missed a lab test that Dr. Kathlen Mody is interested in ([id number]).  Patient has not had gone in for this portion of lab testing...wanted to see Dr. Coralyn Pear first.  Patient denies any autoimmune conditions.  Patient states yesterday, she had shower of new floaters and flashes of light that she had never seen before.       Last edited by Bernarda Caffey, MD on 01/29/2019  2:06 PM. (History)    pt states she was sent here by Dr. 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 fluorometholone (FML) 0.1 % ophthalmic suspension Place 1 drop into the left eye. Four times a day for 5 days and then twice a day for 5 days and then stop.  Marland Kitchen loteprednol (LOTEMAX) 0.5 % ophthalmic suspension   . Polyvinyl Alcohol-Povidone (REFRESH OP) Apply 1 drop to eye daily as needed (dry eyes).   No current facility-administered medications for this visit.  (Ophthalmic Drugs)   Current Outpatient Medications (Other)  Medication Sig  . Ascorbic Acid (VITAMIN C) 1000 MG tablet Take 1,000 mg by mouth daily.  Marland Kitchen b complex vitamins tablet Take 1 tablet by mouth daily.  Marland Kitchen BLACK CURRANT SEED OIL PO Take 1/2 tsp by mouth twice a day  . Cholecalciferol (VITAMIN D) 2000 units tablet Take 2,000 Units by mouth daily.  . Coconut  Oil 1000 MG CAPS Take by mouth.  . COCONUT OIL PO Take 15 mLs by mouth daily.  . Docosahexaenoic Acid (DHA COMPLETE PO) Take 1 tablet by mouth daily.  Marland Kitchen GARLIC PO Take 1 tablet by mouth 4 (four) times a week.  . Ginkgo Biloba (GINKOBA PO) Take 1 tablet by mouth daily.  . Glucosamine HCl (GLUCOSAMINE PO) Take 3 capsules by mouth daily.  . Iron-Vitamin C (IRON 100/C) 100-250 MG TABS Fusion Plus 130 mg iron-1,250 mcg capsule  Take 1 capsule every day by oral route.  Marland Kitchen MAGNESIUM GLYCINATE PLUS PO Take 400 mg by mouth daily.   . NON FORMULARY Take 450 mg by mouth daily. Patient takes fennel  .  Probiotic Product (PROBIOTIC & ACIDOPHILUS EX ST PO) Take 1 tablet by mouth daily.   . Protein POWD Take 1 scoop by mouth daily. Mix with water  . Specialty Vitamins Products (ONE-A-DAY BONE STRENGTH PO) Take 3 tablets by mouth daily.  Marland Kitchen UNABLE TO FIND Med Name: MCT oil, one tablespoon per day   No current facility-administered medications for this visit.  (Other)      REVIEW OF SYSTEMS: ROS    Positive for: Eyes   Negative for: Constitutional, Gastrointestinal, Neurological, Skin, Genitourinary, Musculoskeletal, HENT, Endocrine, Cardiovascular, Respiratory, Psychiatric, Allergic/Imm, Heme/Lymph   Last edited by Doneen Poisson on 01/29/2019  1:45 PM. (History)       ALLERGIES No Known Allergies  PAST MEDICAL HISTORY Past Medical History:  Diagnosis Date  . Abnormal laboratory test 03/02/2018  . Arthritis    bilateral knees  . Chest pain   . Complication of anesthesia    see note about TIA-32 yrs ago  . Concussion 05/11/2013  . Dizziness   . Head pain 07/25/15   recent blow to head  . History of concussion   . 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 Pericarditis 04/2015  . TIA (transient ischemic attack) 1988   occurred three days after anesthesia  . Vertigo 08/07/2013   Past Surgical History:  Procedure Laterality Date  . APPENDECTOMY    . ARTERY BIOPSY Left 03/09/2018   Procedure: BIOPSY LEFT TEMPORAL ARTERY;  Surgeon: Judeth Horn, MD;  Location: [place];  Service: General;  Laterality: Left;  MAC  . CERVICAL CONE BIOPSY    . COLONOSCOPY    . CYSTOCELE REPAIR    . DILATATION & CURETTAGE/HYSTEROSCOPY WITH MYOSURE N/A 08/08/2015   Procedure: DILATATION & CURETTAGE/HYSTEROSCOPY WITH MYOSURE;  Surgeon: Servando Salina, MD;  Location: [facility];  Service: Gynecology;  Laterality: N/A;  . TONSILLECTOMY    . TUBAL LIGATION      FAMILY HISTORY Family History  Problem Relation Age of Onset  . Cancer Mother   . Heart disease Father   . Heart failure Father   .  Coronary artery disease Other     SOCIAL HISTORY Social History   Tobacco Use  . Smoking status: Never Smoker  . Smokeless tobacco: Never Used  Substance Use Topics  . Alcohol use: No  . Drug use: No         OPHTHALMIC EXAM:  Base Eye Exam    Visual Acuity (Snellen - Linear)      Right Left   Dist cc 20/30 -2 20/30 -1   Dist ph cc 20/25 -2 20/25 -1   Correction: Glasses       Tonometry (Tonopen, 1:45 PM)      Right Left   Pressure 20 19  Pupils      Dark Light Shape React APD   Right 2 1 Round Minimal 0   Left 2 1 Round Minimal 0       Visual Fields      Left Right    Full Full       Extraocular Movement      Right Left    Full Full       Neuro/Psych    Oriented x3: Yes   Mood/Affect: Normal       Dilation    Both eyes: 1.0% Mydriacyl, 2.5% Phenylephrine @ 1:45 PM        Slit Lamp and Fundus Exam    Slit Lamp Exam      Right Left   Lids/Lashes Dermatochalasis - upper lid, mild Meibomian gland dysfunction Dermatochalasis - upper lid, mild Meibomian gland dysfunction   Conjunctiva/Sclera White and quiet White and quiet   Cornea mild Arcus, 1+ Punctate epithelial erosions, 1+ Guttata mild Arcus, 1+ Punctate epithelial erosions, 1+ Guttata   Anterior Chamber deep and clear deep, 0.5+cell/pigment   Iris Round and dilated slightly irregular, Round and dilated, Posterior synechiae at 0130 and 0600   Lens 2-3+ Nuclear sclerosis with early brunescence, 2+ Cortical cataract 2-3+ Nuclear sclerosis with early brunescence, 2+ Cortical cataract, focal pigment on anterior capsule   Vitreous Vitreous syneresis Vitreous syneresis       Fundus Exam      Right Left   Disc Pink and Sharp, mild temporal PPP Pink and Sharp, mild temporal PPP   C/D Ratio 0.4 0.5   Macula Blunted foveal reflex, RPE mottling and clumping, No heme or edema Blunted foveal reflex, RPE mottling and clumping, No heme or edema   Vessels Vascular attenuation Vascular attenuation    Periphery Attached, mild RPE changes, no snowballs or snowbanking  Attached, mild RPE changes, no snowballs or snowbanking         Refraction    Wearing Rx      Sphere Cylinder Axis Add   Right +1.25 +0.25 160 +2.50   Left +1.25 Sphere         Manifest Refraction      Sphere Cylinder Dist VA   Right +0.75 Sphere 20/25-2   Left +0.75 Sphere 20/25-2          IMAGING AND PROCEDURES  Imaging and Procedures for _0 @  OCT, Retina - OU - Both Eyes       Right Eye Quality was good. Central Foveal Thickness: 226. Progression has no prior data. Findings include normal foveal contour, no IRF, no SRF (Trace ERM with focal flattening of central fovea).   Left Eye Quality was good. Central Foveal Thickness: 232. Progression has no prior data. Findings include normal foveal contour, no IRF, no SRF (Trace ERM with focal flattening of central fovea).   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 CME - cystoid macular edema. PED - pigment epithelial detachment. IRF - intraretinal fluid. SRF - subretinal fluid. EZ - ellipsoid zone. ERM - epiretinal membrane. ORA - outer retinal atrophy. ORT - outer retinal tubulation. SRHM - subretinal hyper-reflective material        Fluorescein Angiography Optos (Transit OS)       Right Eye   Progression has no prior data. Early phase findings include normal observations. Mid/Late  phase findings include normal observations.   Left Eye   Progression has no prior data. Early phase findings include normal observations. Mid/Late phase findings include normal observations (No leakage, CME or vasculitis).   Notes **Images stored on drive**  Impression: Normal study OU OS:  No posterior uveitis, leakage or vasculitis                  ASSESSMENT/PLAN:     ICD-10-CM   1. Iritis of left eye  H20.9   2. Retinal edema  H35.81 OCT, Retina - OU - Both Eyes  3. Combined forms of age-related cataract of both eyes  H25.813   4. Hypertensive retinopathy of both eyes  H35.033 Fluorescein Angiography Optos (Transit OS)    1,2. Recurrent / Chronic Iritis OS  - by history recurrent episodes of iritis w/ photophobia, tearing and redness OS starting in May 2020  - limited lab work up initiated by Dr. Kathlen Mody and PCP -- unable to see labs/results  - exam today exhibits minimal AC cell OS, but clear evidence of prior anterior uveitis/inflammation -- posterior synechiae and pigment on anterior capsule  - today pt remains asymptomatic, tapered loteprednol to BID from QID OS last week  - FA (8.24.20) and dilated exam without evidence of posterior or intermediate uveitis OU  - OCT without CME or retinal edema OU  - discussed findings, prognosis and treatment options -- probably just requires extended steroid taper  - recommend continuing loteprednol BID OS until next visit in 4 wks  - will hold off on further laboratory work up for now, but will check labs if iritis worsens or fails to resolve completely  - f/u 4 weeks -- DFE/OCT  3. Mixed form age related cataract  - The symptoms of cataract, surgical options, and treatments and risks were discussed with patient.  - discussed diagnosis and progression  - not yet visually significant  - monitor for now   Ophthalmic Meds Ordered this visit:  No orders of the defined types were placed in this encounter.      Return in about 4 weeks (around 02/26/2019) for f/u uveitis OS, DFE, OCT.  There are no Patient Instructions on file for this visit.   Explained the diagnoses, plan, and follow up with the patient and they expressed understanding.  Patient expressed understanding of the importance of proper follow up care.   This document serves as a record of services personally performed by Gardiner Sleeper, MD,  PhD. It was created on their behalf by Ernest Mallick, OA, an ophthalmic assistant. The creation of this record is the provider's dictation and/or activities during the visit.    Electronically signed by: Ernest Mallick, OA  08.23.2020 4:36 PM    Gardiner Sleeper, M.D., Ph.D. Diseases & Surgery of the Retina and Vitreous [facility]   I have reviewed the above documentation for accuracy and completeness, and I agree with the above. Gardiner Sleeper, M.D., Ph.D. 01/29/19 4:36 PM    Abbreviations: M myopia (nearsighted); A astigmatism; H hyperopia (farsighted); P presbyopia; Mrx spectacle prescription;  CTL contact lenses; OD right eye; OS left eye; OU both eyes  XT exotropia; ET esotropia; PEK punctate epithelial keratitis; PEE punctate epithelial erosions; DES dry eye syndrome; MGD meibomian gland dysfunction; ATs artificial tears; PFAT's preservative free artificial tears; [place] nuclear sclerotic cataract; PSC posterior subcapsular cataract; ERM epi-retinal membrane; PVD posterior vitreous detachment; RD retinal detachment; DM diabetes mellitus; DR diabetic retinopathy; NPDR non-proliferative  diabetic retinopathy; PDR proliferative diabetic retinopathy; CSME clinically significant macular edema; DME diabetic macular edema; dbh dot blot hemorrhages; CWS cotton wool spot; POAG primary open angle glaucoma; C/D cup-to-disc ratio; HVF humphrey visual field; GVF goldmann visual field; OCT optical coherence tomography; IOP intraocular pressure; BRVO Branch retinal vein occlusion; CRVO central retinal vein occlusion; CRAO central retinal artery occlusion; BRAO branch retinal artery occlusion; RT retinal tear; SB scleral buckle; PPV pars plana vitrectomy; VH Vitreous hemorrhage; PRP panretinal laser photocoagulation; IVK intravitreal kenalog; VMT vitreomacular traction; MH Macular hole;  NVD neovascularization of the disc; NVE neovascularization elsewhere; AREDS age related eye disease study;  ARMD age related macular degeneration; POAG primary open angle glaucoma; EBMD epithelial/anterior basement membrane dystrophy; ACIOL anterior chamber intraocular lens; IOL intraocular lens; PCIOL posterior chamber intraocular lens; Phaco/IOL phacoemulsification with intraocular lens placement; [place] photorefractive keratectomy; LASIK laser assisted in situ keratomileusis; HTN hypertension; DM diabetes mellitus; COPD chronic obstructive pulmonary disease

## 2019-01-29 ENCOUNTER — Encounter (INDEPENDENT_AMBULATORY_CARE_PROVIDER_SITE_OTHER): Payer: Self-pay | Admitting: Ophthalmology

## 2019-01-29 ENCOUNTER — Ambulatory Visit (INDEPENDENT_AMBULATORY_CARE_PROVIDER_SITE_OTHER): Payer: Medicare Other | Admitting: Ophthalmology

## 2019-01-29 ENCOUNTER — Other Ambulatory Visit: Payer: Self-pay

## 2019-01-29 DIAGNOSIS — H3581 Retinal edema: Secondary | ICD-10-CM | POA: Diagnosis not present

## 2019-01-29 DIAGNOSIS — H209 Unspecified iridocyclitis: Secondary | ICD-10-CM

## 2019-01-29 DIAGNOSIS — H35033 Hypertensive retinopathy, bilateral: Secondary | ICD-10-CM | POA: Diagnosis not present

## 2019-01-29 DIAGNOSIS — H25813 Combined forms of age-related cataract, bilateral: Secondary | ICD-10-CM | POA: Diagnosis not present

## 2019-02-13 ENCOUNTER — Encounter (HOSPITAL_COMMUNITY): Payer: Medicare Other

## 2019-02-13 ENCOUNTER — Other Ambulatory Visit (HOSPITAL_COMMUNITY): Payer: Medicare Other

## 2019-02-16 ENCOUNTER — Other Ambulatory Visit: Payer: Self-pay

## 2019-02-16 ENCOUNTER — Emergency Department (HOSPITAL_COMMUNITY)
Admission: EM | Admit: 2019-02-16 | Discharge: 2019-02-17 | Disposition: A | Discharge status: Home / Self Care | Payer: Medicare Other | Attending: Emergency Medicine | Admitting: Emergency Medicine

## 2019-02-16 ENCOUNTER — Encounter (HOSPITAL_COMMUNITY): Payer: Self-pay

## 2019-02-16 DIAGNOSIS — R42 Dizziness and giddiness: Secondary | ICD-10-CM | POA: Insufficient documentation

## 2019-02-16 DIAGNOSIS — R5383 Other fatigue: Secondary | ICD-10-CM | POA: Diagnosis present

## 2019-02-16 DIAGNOSIS — Z79899 Other long term (current) drug therapy: Secondary | ICD-10-CM | POA: Diagnosis not present

## 2019-02-16 LAB — COMPREHENSIVE METABOLIC PANEL
ALT: 12 U/L (ref 0–44)
AST: 22 U/L (ref 15–41)
Albumin: 4.1 g/dL (ref 3.5–5.0)
Alkaline Phosphatase: 77 U/L (ref 38–126)
Anion gap: 10 (ref 5–15)
BUN: 12 mg/dL (ref 8–23)
CO2: 24 mmol/L (ref 22–32)
Calcium: 10.2 mg/dL (ref 8.9–10.3)
Chloride: 104 mmol/L (ref 98–111)
Creatinine, Ser: 0.89 mg/dL (ref 0.44–1.00)
GFR calc Af Amer: >60 mL/min (ref >60)
GFR calc non Af Amer: >60 mL/min (ref >60)
Glucose, Bld: 84 mg/dL (ref 70–99)
Potassium: 3.1 mmol/L — ABNORMAL LOW (ref 3.5–5.1)
Sodium: 138 mmol/L (ref 135–145)
Total Bilirubin: 0.5 mg/dL (ref 0.3–1.2)
Total Protein: 8.2 g/dL — ABNORMAL HIGH (ref 6.5–8.1)

## 2019-02-16 LAB — CBC
HCT: 40.5 % (ref 36.0–46.0)
Hemoglobin: 11.6 g/dL — ABNORMAL LOW (ref 12.0–15.0)
MCH: 26.5 pg (ref 26.0–34.0)
MCHC: 28.6 g/dL — ABNORMAL LOW (ref 30.0–36.0)
MCV: 92.7 fL (ref 80.0–100.0)
Platelets: 256 10*3/uL (ref 150–400)
RBC: 4.37 MIL/uL (ref 3.87–5.11)
RDW: 17.2 % — ABNORMAL HIGH (ref 11.5–15.5)
WBC: 3.2 10*3/uL — ABNORMAL LOW (ref 4.0–10.5)
nRBC: 0 % (ref 0.0–0.2)

## 2019-02-16 LAB — TYPE AND SCREEN
ABO/RH(D): O POS
Antibody Screen: POSITIVE

## 2019-02-16 MED ORDER — SODIUM CHLORIDE 0.9 % IV BOLUS
500.0000 mL | Freq: Once | INTRAVENOUS | Status: AC
  Administered 2019-02-16: 500 mL via INTRAVENOUS

## 2019-02-16 MED ORDER — MECLIZINE HCL 25 MG PO TABS
25.0000 mg | ORAL_TABLET | Freq: Once | ORAL | Status: AC
  Administered 2019-02-16: 25 mg via ORAL
  Filled 2019-02-16: qty 1

## 2019-02-16 NOTE — ED Notes (Addendum)
Pt ambulated to the restroom and was unsuccessful in providing a urine specimen. She was provided with another cup of water.

## 2019-02-16 NOTE — ED Notes (Signed)
Pt able to ambulate to BR with assist x1 from Probation officer. Pt states "I feel much better and am less dizzy than before". Pt needing x1 assist while ambulating.

## 2019-02-16 NOTE — ED Notes (Signed)
Pt stated that she is unable to give urine sample at this time. Pt informed to call nurse or tech when she feels able.

## 2019-02-16 NOTE — ED Notes (Signed)
Pt given water for PO challenge. She tolerated it well. Also, she said she is unable to provide a urine specimen at this time.

## 2019-02-16 NOTE — ED Provider Notes (Signed)
[facility] DEPT Provider Note   CSN: [id number] Arrival date & time: 02/16/19  1405     History   Chief Complaint Chief Complaint  Patient presents with  . lethargic  . GI Bleeding    HPI Kilah Drahos is a 73 y.o. female.     Patient is a 73 year old female who presents with possible low hemoglobin.  She reports over the last 2 to 3 days she has felt tired and dizzy.  She is dizzy when she walks around.  She denies any spinning sensation but says it feels more like a lightheadedness.  She does not have dizziness on laying down.  She had a headache yesterday but no headache today.  No numbness or weakness to extremities.  No speech deficits or vision changes.  No chest pain or shortness of breath.  No abdominal pain.  No blood in her stools or melena.  She had a GI bleed after taking ibuprofen during the summer.  She states her hemoglobin dropped in the 6.  She feels similar now to when it did then and she wanted to have her hemoglobin checked.  She denies any urinary symptoms.  No diarrhea.  No nausea or vomiting.     Past Medical History:  Diagnosis Date  . Abnormal laboratory test 03/02/2018  . Arthritis    bilateral knees  . Chest pain   . Complication of anesthesia    see note about TIA-32 yrs ago  . Concussion 05/11/2013  . Dizziness   . Head pain 07/25/15   recent blow to head  . History of concussion   . 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 Pericarditis 04/2015  . TIA (transient ischemic attack) 1988   occurred three days after anesthesia  . Vertigo 08/07/2013    Patient Active Problem List   Diagnosis Date Noted  . Elevated blood pressure reading 10/12/2018  . History of pericarditis 10/12/2018  . Abnormal laboratory test 03/02/2018  . 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 Chest pain 04/09/2014  . Vertigo 08/07/2013  . Head pain   . Concussion 05/11/2013  . Headache 05/11/2013    Past Surgical History:  Procedure Laterality Date  .  APPENDECTOMY    . ARTERY BIOPSY Left 03/09/2018   Procedure: BIOPSY LEFT TEMPORAL ARTERY;  Surgeon: Judeth Horn, MD;  Location: [facility];  Service: General;  Laterality: Left;  MAC  . CERVICAL CONE BIOPSY    . COLONOSCOPY    . CYSTOCELE REPAIR    . DILATATION & CURETTAGE/HYSTEROSCOPY WITH MYOSURE N/A 08/08/2015   Procedure: DILATATION & CURETTAGE/HYSTEROSCOPY WITH MYOSURE;  Surgeon: Servando Salina, MD;  Location: [facility];  Service: Gynecology;  Laterality: N/A;  . TONSILLECTOMY    . TUBAL LIGATION       OB History   No obstetric history on file.      Home Medications    Prior to Admission medications   Medication Sig Start Date End Date Taking? Authorizing Provider  Ascorbic Acid (VITAMIN C) 1000 MG tablet Take 1,000 mg by mouth daily.    [provider]  b complex vitamins tablet Take 1 tablet by mouth daily.    [provider]  BLACK CURRANT SEED OIL PO Take 1/2 tsp by mouth twice a day    [provider]  Cholecalciferol (VITAMIN D) 2000 units tablet Take 2,000 Units by mouth daily.    [provider]  Coconut Oil 1000 MG CAPS Take by mouth.    [provider]  COCONUT OIL PO Take 15 mLs by mouth daily.    [provider]  Docosahexaenoic Acid (DHA COMPLETE PO) Take 1 tablet by mouth daily.    [provider]  fluorometholone (FML) 0.1 % ophthalmic suspension Place 1 drop into the left eye. Four times a day for 5 days and then twice a day for 5 days and then stop.    [provider]  GARLIC PO Take 1 tablet by mouth 4 (four) times a week.    [provider]  Ginkgo Biloba (GINKOBA PO) Take 1 tablet by mouth daily.    [provider]  Glucosamine HCl (GLUCOSAMINE PO) Take 3 capsules by mouth daily.    [provider]  Iron-Vitamin C (IRON 100/C) 100-250 MG TABS Fusion Plus 130 mg iron-1,250 mcg capsule  Take 1 capsule every day by oral route.    [provider]  loteprednol (LOTEMAX) 0.5 % ophthalmic suspension  12/01/18   [provider]  MAGNESIUM GLYCINATE PLUS PO Take 400 mg by mouth daily.     [provider]  NON FORMULARY Take 450 mg by mouth daily. Patient takes fennel    [provider]  Polyvinyl Alcohol-Povidone (REFRESH OP) Apply 1 drop to eye daily as needed (dry eyes).    [provider]  Probiotic Product (PROBIOTIC & ACIDOPHILUS EX ST PO) Take 1 tablet by mouth daily.     [provider]  Protein POWD Take 1 scoop by mouth daily. Mix with water    [provider]  Specialty Vitamins Products (ONE-A-DAY BONE STRENGTH PO) Take 3 tablets by mouth daily.    [provider]  UNABLE TO FIND Med Name: MCT oil, one tablespoon per day    [provider]    Family History Family History  Problem Relation Age of Onset  . Cancer Mother   . Heart disease Father   . Heart failure Father   . Coronary artery disease Other     Social History Social History   Tobacco Use  . Smoking status: Never Smoker  . Smokeless tobacco: Never Used  Substance Use Topics  . Alcohol use: No  . Drug use: No     Allergies   Patient has no known allergies.   Review of Systems Review of Systems  Constitutional: Positive for fatigue. Negative for chills, diaphoresis and fever.  HENT: Negative for congestion, rhinorrhea and sneezing.   Eyes: Negative.   Respiratory: Negative for cough, chest tightness and shortness of breath.   Cardiovascular: Negative for chest pain and leg swelling.  Gastrointestinal: Negative for abdominal pain, blood in stool, diarrhea, nausea and vomiting.  Genitourinary: Negative for difficulty urinating, flank pain, frequency and hematuria.  Musculoskeletal: Negative for arthralgias and back pain.  Skin: Negative for rash.  Neurological: Positive for light-headedness. Negative for dizziness, speech difficulty, weakness, numbness and  headaches.     Physical Exam Updated Vital Signs BP 136/83   Pulse 68   Temp 97.8 F (36.6 C) (Oral)   Resp 18   Ht _0  (1.676 m)   Wt 77.1 kg   SpO2 100%   BMI 27.44 kg/m   Physical Exam Constitutional:      Appearance: She is well-developed.  HENT:     Head: Normocephalic and atraumatic.  Eyes:     Pupils: Pupils are equal, round, and reactive to light.     Comments: Subtle horizontal nystagmus with a fast component to the left.  No vertical or rotational nystagmus  Neck:     Musculoskeletal: Normal range of motion and neck supple.  Cardiovascular:     Rate and Rhythm: Normal rate and regular rhythm.     Heart sounds: Normal heart sounds.  Pulmonary:     Effort: Pulmonary effort is normal. No respiratory distress.     Breath sounds: Normal breath sounds. No wheezing or rales.  Chest:     Chest wall: No tenderness.  Abdominal:     General: Bowel sounds are normal.     Palpations: Abdomen is soft.     Tenderness: There is no abdominal tenderness. There is no guarding or rebound.  Musculoskeletal: Normal range of motion.  Lymphadenopathy:     Cervical: No cervical adenopathy.  Skin:    General: Skin is warm and dry.     Findings: No rash.  Neurological:     Mental Status: She is alert and oriented to person, place, and time.     Comments: Motor 5/5 all extremities Sensation grossly intact to LT all extremities Finger to Nose intact, no pronator drift CN II-XII grossly intact        ED Treatments / Results  Labs (all labs ordered are listed, but only abnormal results are displayed) Labs Reviewed  COMPREHENSIVE METABOLIC PANEL - Abnormal; Notable for the following components:      Result Value   Potassium 3.1 (*)    Total Protein 8.2 (*)    All other components within normal limits  CBC - Abnormal; Notable for the following components:   WBC 3.2 (*)    Hemoglobin 11.6 (*)    MCHC 28.6 (*)    RDW 17.2 (*)    All other components within normal  limits  URINALYSIS, ROUTINE W REFLEX MICROSCOPIC  TYPE AND SCREEN    EKG None  Radiology No results found.  Procedures Procedures (including critical care time)  Medications Ordered in ED Medications  sodium chloride 0.9 % bolus 500 mL (0 mLs Intravenous Stopped 02/16/19 2156)  meclizine (ANTIVERT) tablet 25 mg (25 mg Oral Given 02/16/19 2211)     Initial Impression / Assessment and Plan / ED Course  I have reviewed the triage vital signs and the nursing notes.  Pertinent labs & imaging results that were available during my care of the patient were reviewed by me and considered in my medical decision making (see chart for details).        Patient is a 73 year old who presents with dizziness and fatigue.  She was concerned that her blood counts were low as they were when she had a recent GI bleed.  Her hemoglobin is ok.  No neuro deficits.  She seems to have some spinning sensation.  I will try some meclizine.  Her other labs are non-concerning.  She is awaiting a urinalysis and will try the meclizine if she is able to ambulate without ataxia following the meclizine, she can likely be discharged.  Care turned over to [facility], [place].  Final Clinical Impressions(s) / ED Diagnoses   Final diagnoses:  None    ED Discharge Orders    None       Malvin Johns, MD 02/16/19 2235

## 2019-02-16 NOTE — ED Triage Notes (Signed)
Patient states she had surgery in July and states 2 weeks later she had some dark stools. Patient states her Hgb was 6.2 and received blood.  Patient states she has been feeling tired and dizzy at times and states her stools are dark,but not black. Patient states, "I want my Hgb check.

## 2019-02-17 LAB — URINALYSIS, ROUTINE W REFLEX MICROSCOPIC
Bacteria, UA: NONE SEEN
Bilirubin Urine: NEGATIVE
Glucose, UA: NEGATIVE mg/dL
Hgb urine dipstick: NEGATIVE
Ketones, ur: 5 mg/dL — AB
Nitrite: NEGATIVE
Protein, ur: NEGATIVE mg/dL
Specific Gravity, Urine: 1.026 (ref 1.005–1.030)
pH: 5 (ref 5.0–8.0)

## 2019-02-17 MED ORDER — MECLIZINE HCL 12.5 MG PO TABS: 12.5000 mg | ORAL_TABLET | Freq: Three times a day (TID) | ORAL | 0 refills | Status: DC | PRN

## 2019-02-17 NOTE — ED Provider Notes (Signed)
1:30 AM Patient care assumed from MD Belfi at change of shift.  In short, patient has been experiencing dizziness over the past 2 to 3 days.  She has felt increasingly weak and tired.  Initially felt that she may have been experiencing a GI bleed, but labs today are reassuring.  Pending urine at shift change.  Urinalysis reviewed and is negative for UTI.  The patient was treated with meclizine.  On my repeat assessment of the patient, she states that she feels that this medication has helped her significantly.  She had a second attempt at ambulation and the husband reports that she seemed more steady during this trial.  I did walk the patient myself.  She requires a 1 person assist in order to ambulate steadily.  Upon further discussion with the patient, she states that she has had waxing and waning issues with ambulation; however ambulation status as a whole has been declining.  Did discuss transfer to [facility] for MRI to rule out cerebellar stroke.  The patient's waxing and waning type symptoms are more inconsistent with this.  She states that she does not desire transfer at this time given that she is feeling better.  Patient expresses preference for discharge on meclizine with primary care follow-up.  She states that she will return to the emergency department if her symptoms continue to worsen.  I do not think this is unreasonable given chronicity of this issue.  Should patient require further work-up, she has been instructed to present to [facility] where 24-hour MRI is available.  She verbalizes understanding.  Has been also expressing comfort with plan.  Return precautions discussed and provided. Patient discharged in stable condition with no unaddressed concerns.   Results for orders placed or performed during the hospital encounter of 02/16/19  Comprehensive metabolic panel  Result Value Ref Range   Sodium 138 135 - 145 mmol/L   Potassium 3.1 (L) 3.5 - 5.1 mmol/L   Chloride 104 98 - 111  mmol/L   CO2 24 22 - 32 mmol/L   Glucose, Bld 84 70 - 99 mg/dL   BUN 12 8 - 23 mg/dL   Creatinine, Ser 1.610.89 0.44 - 1.00 mg/dL   Calcium 09.610.2 8.9 - 04.510.3 mg/dL   Total Protein 8.2 (H) 6.5 - 8.1 g/dL   Albumin 4.1 3.5 - 5.0 g/dL   AST 22 15 - 41 U/L   ALT 12 0 - 44 U/L   Alkaline Phosphatase 77 38 - 126 U/L   Total Bilirubin 0.5 0.3 - 1.2 mg/dL   GFR calc non Af Amer >60 >60 mL/min   GFR calc Af Amer >60 >60 mL/min   Anion gap 10 5 - 15  CBC  Result Value Ref Range   WBC 3.2 (L) 4.0 - 10.5 K/uL   RBC 4.37 3.87 - 5.11 MIL/uL   Hemoglobin 11.6 (L) 12.0 - 15.0 g/dL   HCT 40.940.5 81.136.0 - 91.446.0 %   MCV 92.7 80.0 - 100.0 fL   MCH 26.5 26.0 - 34.0 pg   MCHC 28.6 (L) 30.0 - 36.0 g/dL   RDW 78.217.2 (H) 95.611.5 - 21.315.5 %   Platelets 256 150 - 400 K/uL   nRBC 0.0 0.0 - 0.2 %  Urinalysis, Routine w reflex microscopic  Result Value Ref Range   Color, Urine YELLOW YELLOW   APPearance CLEAR CLEAR   Specific Gravity, Urine 1.026 1.005 - 1.030   pH 5.0 5.0 - 8.0   Glucose, UA NEGATIVE NEGATIVE mg/dL  Hgb urine dipstick NEGATIVE NEGATIVE   Bilirubin Urine NEGATIVE NEGATIVE   Ketones, ur 5 (A) NEGATIVE mg/dL   Protein, ur NEGATIVE NEGATIVE mg/dL   Nitrite NEGATIVE NEGATIVE   Leukocytes,Ua TRACE (A) NEGATIVE   RBC / HPF 6-10 0 - 5 RBC/hpf   WBC, UA 0-5 0 - 5 WBC/hpf   Bacteria, UA NONE SEEN NONE SEEN   Squamous Epithelial / LPF 0-5 0 - 5   Mucus PRESENT   Type and screen [place]  Result Value Ref Range   ABO/RH(D) O POS    Antibody Screen POS    Sample Expiration 02/19/2019,2359    Antibody Identification      ANTI E Performed at [facility] [address], [place], [place] [address]      Antonietta Breach, PA-C 02/17/19 3291    Fatima Blank, MD 02/18/19 [phone]

## 2019-02-17 NOTE — Discharge Instructions (Signed)
Your work-up in the emergency department was reassuring.  We recommend close follow-up with your primary care doctor within the week.  You would benefit from use of a cane or rolling walker for stability.  If you experience new or worsening symptoms, promptly return to the ED.

## 2019-02-18 ENCOUNTER — Emergency Department (HOSPITAL_COMMUNITY)
Admission: EM | Admit: 2019-02-18 | Discharge: 2019-02-18 | Disposition: A | Discharge status: Home / Self Care | Payer: Medicare Other | Attending: Emergency Medicine | Admitting: Emergency Medicine

## 2019-02-18 ENCOUNTER — Encounter (HOSPITAL_COMMUNITY): Payer: Self-pay | Admitting: *Deleted

## 2019-02-18 ENCOUNTER — Emergency Department (HOSPITAL_COMMUNITY): Payer: Medicare Other

## 2019-02-18 ENCOUNTER — Other Ambulatory Visit: Payer: Self-pay

## 2019-02-18 ENCOUNTER — Ambulatory Visit (HOSPITAL_COMMUNITY)
Admission: EM | Admit: 2019-02-18 | Discharge: 2019-02-18 | Disposition: A | Discharge status: Home / Self Care | Payer: Medicare Other | Source: Home / Self Care

## 2019-02-18 ENCOUNTER — Encounter (HOSPITAL_COMMUNITY): Payer: Self-pay

## 2019-02-18 DIAGNOSIS — Z79899 Other long term (current) drug therapy: Secondary | ICD-10-CM | POA: Insufficient documentation

## 2019-02-18 DIAGNOSIS — R55 Syncope and collapse: Secondary | ICD-10-CM | POA: Diagnosis not present

## 2019-02-18 DIAGNOSIS — U071 COVID-19: Secondary | ICD-10-CM | POA: Diagnosis not present

## 2019-02-18 DIAGNOSIS — R42 Dizziness and giddiness: Secondary | ICD-10-CM | POA: Diagnosis not present

## 2019-02-18 DIAGNOSIS — I950 Idiopathic hypotension: Secondary | ICD-10-CM

## 2019-02-18 HISTORY — DX: Anemia, unspecified: D64.9

## 2019-02-18 LAB — COMPREHENSIVE METABOLIC PANEL
ALT: 11 U/L (ref 0–44)
AST: 24 U/L (ref 15–41)
Albumin: 3 g/dL — ABNORMAL LOW (ref 3.5–5.0)
Alkaline Phosphatase: 58 U/L (ref 38–126)
Anion gap: 11 (ref 5–15)
BUN: 10 mg/dL (ref 8–23)
CO2: 21 mmol/L — ABNORMAL LOW (ref 22–32)
Calcium: 8.8 mg/dL — ABNORMAL LOW (ref 8.9–10.3)
Chloride: 105 mmol/L (ref 98–111)
Creatinine, Ser: 1.06 mg/dL — ABNORMAL HIGH (ref 0.44–1.00)
GFR calc Af Amer: >60 mL/min (ref >60)
GFR calc non Af Amer: 52 mL/min — ABNORMAL LOW (ref >60)
Glucose, Bld: 120 mg/dL — ABNORMAL HIGH (ref 70–99)
Potassium: 3.1 mmol/L — ABNORMAL LOW (ref 3.5–5.1)
Sodium: 137 mmol/L (ref 135–145)
Total Bilirubin: 0.5 mg/dL (ref 0.3–1.2)
Total Protein: 6.2 g/dL — ABNORMAL LOW (ref 6.5–8.1)

## 2019-02-18 LAB — CBC WITH DIFFERENTIAL/PLATELET
Abs Immature Granulocytes: 0.01 10*3/uL (ref 0.00–0.07)
Basophils Absolute: 0 10*3/uL (ref 0.0–0.1)
Basophils Relative: 0 %
Eosinophils Absolute: 0 10*3/uL (ref 0.0–0.5)
Eosinophils Relative: 0 %
HCT: 33.8 % — ABNORMAL LOW (ref 36.0–46.0)
Hemoglobin: 10 g/dL — ABNORMAL LOW (ref 12.0–15.0)
Immature Granulocytes: 0 %
Lymphocytes Relative: 47 %
Lymphs Abs: 1.6 10*3/uL (ref 0.7–4.0)
MCH: 26.8 pg (ref 26.0–34.0)
MCHC: 29.6 g/dL — ABNORMAL LOW (ref 30.0–36.0)
MCV: 90.6 fL (ref 80.0–100.0)
Monocytes Absolute: 0.9 10*3/uL (ref 0.1–1.0)
Monocytes Relative: 26 %
Neutro Abs: 0.9 10*3/uL — ABNORMAL LOW (ref 1.7–7.7)
Neutrophils Relative %: 27 %
Platelets: 176 10*3/uL (ref 150–400)
RBC: 3.73 MIL/uL — ABNORMAL LOW (ref 3.87–5.11)
RDW: 16.8 % — ABNORMAL HIGH (ref 11.5–15.5)
WBC: 3.4 10*3/uL — ABNORMAL LOW (ref 4.0–10.5)
nRBC: 0 % (ref 0.0–0.2)

## 2019-02-18 LAB — SARS CORONAVIRUS 2 BY RT PCR (HOSPITAL ORDER, PERFORMED IN ~~LOC~~ HOSPITAL LAB): SARS Coronavirus 2: POSITIVE — AB

## 2019-02-18 LAB — URINALYSIS, ROUTINE W REFLEX MICROSCOPIC
Bacteria, UA: NONE SEEN
Bilirubin Urine: NEGATIVE
Glucose, UA: NEGATIVE mg/dL
Ketones, ur: NEGATIVE mg/dL
Leukocytes,Ua: NEGATIVE
Nitrite: NEGATIVE
Protein, ur: NEGATIVE mg/dL
Specific Gravity, Urine: 1.005 (ref 1.005–1.030)
pH: 5 (ref 5.0–8.0)

## 2019-02-18 MED ORDER — SODIUM CHLORIDE 0.9% FLUSH: 3.0000 mL | Freq: Once | INTRAVENOUS | Status: DC

## 2019-02-18 MED ORDER — SODIUM CHLORIDE 0.9 % IV BOLUS
1000.0000 mL | Freq: Once | INTRAVENOUS | Status: AC
  Administered 2019-02-18: 1000 mL via INTRAVENOUS

## 2019-02-18 NOTE — ED Triage Notes (Signed)
Pt arrives from Urgent Care via EMS. The pt went to urgent care today with c/o right ear pain, she said she thought she had an ear infection and it was causing her to have vertigo. The pt was sitting in the lobby and had a near syncopal episode. BP for EMS was in the 70's. IV was established prior to transport, 1Liter NS infusing on arrival to the ED. Pt reports she was recently in the hospital for a GI Bleed, and that she has bleeding ulcers. Currently, she says she does feel better and denies any pain.

## 2019-02-18 NOTE — ED Notes (Addendum)
When this nurse arrived to treatment room, patient gazing distant.  Patient is clammy, initially responded with grimace to sternal rub.  Patient answered questions weakly, but accurately. Reclined head of exam table to flat position Requested dr Meda Coffee to bedside.

## 2019-02-18 NOTE — Discharge Instructions (Addendum)
°  You had a positive COVID-19 test.  Self-isolation should last until: At least 3 days (72 hours) have passed since recovery, defined as resolution of fever without the use of fever reducing medications and improvement in respiratory symptoms (e.g., cough, shortness of breath), and At least 7 days have passed since symptoms first appeared.  Dehydration  We suspect you may be dehydrated.  Symptoms of any other illness will be intensified and complicated by dehydration. Dehydration can also extend the duration of symptoms. Dehydration typically causes its own symptoms including lightheadedness, nausea, headaches, fatigue increased thirst, and generally feeling unwell. Drink plenty of fluids and get plenty of rest. You should be drinking at least half a liter of water every hour or two to stay hydrated. Electrolyte drinks (ex. Gatorade, Powerade, Pedialyte) are also encouraged. You should be drinking enough fluids to make your urine light yellow, almost clear. If this is not the case, you are not drinking enough water.  The status of those with COVID-19 can change quickly.  Be sure to reassess throughout the day, every day and be alert for worsening symptoms.  Return to the emergency department for shortness of breath, chest pain, repeat passing out, persistent vomiting, confusion, or any other major concerns.

## 2019-02-18 NOTE — ED Triage Notes (Signed)
Pt states she has had a fever off and on for 2 days. Pt states her right has been hurting as well. Pt daughter has given her tylenol for her fever.

## 2019-02-18 NOTE — ED Notes (Signed)
Patient Alert and oriented to baseline. Stable and ambulatory to baseline. Patient verbalized understanding of the discharge instructions.  Patient belongings were taken by the patient.   

## 2019-02-18 NOTE — ED Notes (Signed)
ED Provider at bedside. 

## 2019-02-18 NOTE — ED Provider Notes (Signed)
[facility] EMERGENCY DEPARTMENT Provider Note   CSN: [id number] Arrival date & time: 02/18/19  1719     History   Chief Complaint Chief Complaint  Patient presents with  . Near Syncope    HPI [name] is a 73 y.o. female.     HPI   [name] is a 73 y.o. female, with a history of anemia, GI bleed, presenting to the ED with generalized weakness beginning 9/9.  This is accompanied with loss of balance with standing and ambulation.  The symptoms improved with rest. She has been experiencing right ear pain for the last week, throbbing, intermittent, nonradiating. This afternoon, patient was at urgent care to have her ears examined when she had a possible syncopal episode.  Patient's daughter, who was present at the time, notes patient still had her eyes open but was not responding to anyone or anything.  There was no shaking or seizure activity noted.  There was no noted postictal period.  She notes she had a fever this morning for which she took Tylenol.  Patient denies chest pain, shortness of breath, cough, headache, neck/back pain, persistent abdominal pain, N/V/D, headache, vision changes, focal neurologic deficits, urinary symptoms, or any other complaints.  Patient states she underwent hysterectomy and prolapsed bladder repair at [facility] July 24.  1 week later, she experienced melanotic GI bleed with anemia requiring 2 units RBC transfusion.  Subsequent endoscopy showed 2 upper GI ulcers.  She has been experiencing occasional dark stools since then, but not as severe as that first episode.   Past Medical History:  Diagnosis Date  . Abnormal laboratory test 03/02/2018  . Anemia   . Arthritis    bilateral knees  . Chest pain   . Complication of anesthesia    see note about TIA-32 yrs ago  . Concussion 05/11/2013  . Dizziness   . Head pain 07/25/15   recent blow to head  . History of concussion   . 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 Pericarditis  04/2015  . TIA (transient ischemic attack) 1988   occurred three days after anesthesia  . Vertigo 08/07/2013    Patient Active Problem List   Diagnosis Date Noted  . Elevated blood pressure reading 10/12/2018  . History of pericarditis 10/12/2018  . Abnormal laboratory test 03/02/2018  . 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 Chest pain 04/09/2014  . Vertigo 08/07/2013  . Head pain   . Concussion 05/11/2013  . Headache 05/11/2013    Past Surgical History:  Procedure Laterality Date  . APPENDECTOMY    . ARTERY BIOPSY Left 03/09/2018   Procedure: BIOPSY LEFT TEMPORAL ARTERY;  Surgeon: Judeth Horn, MD;  Location: [facility];  Service: General;  Laterality: Left;  MAC  . CERVICAL CONE BIOPSY    . COLONOSCOPY    . CYSTOCELE REPAIR    . DILATATION & CURETTAGE/HYSTEROSCOPY WITH MYOSURE N/A 08/08/2015   Procedure: DILATATION & CURETTAGE/HYSTEROSCOPY WITH MYOSURE;  Surgeon: Servando Salina, MD;  Location: [facility];  Service: Gynecology;  Laterality: N/A;  . TONSILLECTOMY    . TUBAL LIGATION       OB History   No obstetric history on file.      Home Medications    Prior to Admission medications   Medication Sig Start Date End Date Taking? Authorizing Provider  acetaminophen (TYLENOL) 500 MG tablet Take 1,000 mg by mouth every 6 (six) hours as needed for moderate pain.   Yes [provider]  Ascorbic Acid (  VITAMIN C) 1000 MG tablet Take 1,000 mg by mouth daily.   Yes [provider]  b complex vitamins tablet Take 1 tablet by mouth daily.   Yes [provider]  BLACK CURRANT SEED OIL PO Take 2.5 mLs by mouth daily.    Yes [provider]  Black Elderberry,Berry-Flower, 575 MG CAPS Take 2 capsules by mouth daily.   Yes [provider]  Cholecalciferol (VITAMIN D) 2000 units tablet Take 2,000 Units by mouth daily.   Yes [provider]  COCONUT OIL-FLAXSEED OIL PO Take 15 mLs by mouth daily.   Yes [provider]   Docosahexaenoic Acid (DHA COMPLETE PO) Take 1 tablet by mouth every other day.    Yes [provider]  Ginkgo Biloba (GINKOBA PO) Take 1 tablet by mouth daily.   Yes [provider]  Glucosamine HCl (GLUCOSAMINE PO) Take 3 capsules by mouth daily.   Yes [provider]  Iron-Vitamin C (IRON 100/C) 100-250 MG TABS Take 1 capsule by mouth daily.    Yes [provider]  loteprednol (LOTEMAX) 0.5 % ophthalmic suspension Place 1 drop into the left eye 2 (two) times daily.  12/01/18  Yes [provider]  MAGNESIUM GLYCINATE PLUS PO Take 400 mg by mouth daily.    Yes [provider]  meclizine (ANTIVERT) 12.5 MG tablet Take 1-2 tablets (12.5-25 mg total) by mouth 3 (three) times daily as needed for dizziness. 02/17/19  Yes Antonietta Breach, PA-C  pantoprazole (PROTONIX) 40 MG tablet Take 40 mg by mouth 2 (two) times daily.   Yes [provider]  Polyvinyl Alcohol-Povidone (REFRESH OP) Apply 1 drop to eye daily as needed (dry eyes).   Yes [provider]  Probiotic Product (PROBIOTIC & ACIDOPHILUS EX ST PO) Take 1 tablet by mouth daily.    Yes [provider]  Protein POWD Take 1 scoop by mouth daily. Mix with water   Yes [provider]  Specialty Vitamins Products (ONE-A-DAY BONE STRENGTH PO) Take 3 tablets by mouth daily.   Yes [provider]  UNABLE TO FIND Take 5 mLs by mouth daily. Med Name: MCT oil   Yes [provider]    Family History Family History  Problem Relation Age of Onset  . Cancer Mother   . Heart disease Father   . Heart failure Father   . Coronary artery disease Other     Social History Social History   Tobacco Use  . Smoking status: Never Smoker  . Smokeless tobacco: Never Used  Substance Use Topics  . Alcohol use: No  . Drug use: No     Allergies   Patient has no known allergies.   Review of Systems Review of Systems  Constitutional: Positive for fever.  Negative for chills.  HENT: Positive for ear pain. Negative for sore throat, trouble swallowing and voice change.   Respiratory: Negative for cough and shortness of breath.   Cardiovascular: Negative for chest pain.  Gastrointestinal: Positive for abdominal pain. Negative for blood in stool, constipation, diarrhea, nausea and vomiting.  Genitourinary: Negative for dysuria, flank pain, frequency, hematuria, vaginal bleeding and vaginal discharge.  Musculoskeletal: Negative for back pain and neck pain.  Neurological: Positive for dizziness and syncope. Negative for headaches.  All other systems reviewed and are negative.    Physical Exam Updated Vital Signs BP (!) 89/51 (BP Location: Right Arm)   Pulse 74   Temp 98.1 F (36.7 C) (Oral)   Resp Marland Kitchen)  24   Ht _0  (1.676 m)   Wt 77.1 kg   SpO2 96%   BMI 27.44 kg/m   Physical Exam Vitals signs and nursing note reviewed.  Constitutional:      General: She is not in acute distress.    Appearance: She is well-developed. She is not diaphoretic.  HENT:     Head: Normocephalic and atraumatic.     Mouth/Throat:     Mouth: Mucous membranes are moist.     Pharynx: Oropharynx is clear.  Eyes:     Conjunctiva/sclera: Conjunctivae normal.  Neck:     Musculoskeletal: Neck supple.  Cardiovascular:     Rate and Rhythm: Normal rate and regular rhythm.     Pulses: Normal pulses.          Radial pulses are 2+ on the right side and 2+ on the left side.       Posterior tibial pulses are 2+ on the right side and 2+ on the left side.     Heart sounds: Normal heart sounds.     Comments: Tactile temperature in the extremities appropriate and equal bilaterally. Pulmonary:     Effort: Pulmonary effort is normal. No respiratory distress.     Breath sounds: Normal breath sounds.  Abdominal:     Palpations: Abdomen is soft.     Tenderness: There is no abdominal tenderness. There is no guarding.  Genitourinary:    Rectum: Guaiac result negative.      Comments: Rectal Exam:  No external hemorrhoids, fissures, or lesions noted.  No frank blood or melena, though her stool appears to be darker than expected. No stool burden.  No rectal tenderness. No foreign bodies noted.   RN, Claiborne Billings, served as chaperone during the rectal exam.  Hemoccult was verbally reported to me as negative. Musculoskeletal:     Right lower leg: No edema.     Left lower leg: No edema.  Lymphadenopathy:     Cervical: No cervical adenopathy.  Skin:    General: Skin is warm and dry.  Neurological:     Mental Status: She is alert and oriented to person, place, and time.     Comments: Sensation grossly intact to light touch in the extremities.  Grip strengths equal bilaterally.  Strength 5/5 in all extremities. No gait disturbance. Coordination intact. Cranial nerves III-XII grossly intact. No facial droop.   Psychiatric:        Mood and Affect: Mood and affect normal.        Speech: Speech normal.        Behavior: Behavior normal.      ED Treatments / Results  Labs (all labs ordered are listed, but only abnormal results are displayed) Labs Reviewed  SARS CORONAVIRUS 2 (HOSPITAL ORDER, [facility]) - Abnormal; Notable for the following components:      Result Value   SARS Coronavirus 2 POSITIVE (*)    All other components within normal limits  URINALYSIS, ROUTINE W REFLEX MICROSCOPIC - Abnormal; Notable for the following components:   Color, Urine STRAW (*)    Hgb urine dipstick SMALL (*)    All other components within normal limits  COMPREHENSIVE METABOLIC PANEL - Abnormal; Notable for the following components:   Potassium 3.1 (*)    CO2 21 (*)    Glucose, Bld 120 (*)    Creatinine, Ser 1.06 (*)    Calcium 8.8 (*)    Total Protein 6.2 (*)    Albumin  3.0 (*)    GFR calc non Af Amer 52 (*)    All other components within normal limits  CBC WITH DIFFERENTIAL/PLATELET - Abnormal; Notable for the following components:   WBC 3.4 (*)     RBC 3.73 (*)    Hemoglobin 10.0 (*)    HCT 33.8 (*)    MCHC 29.6 (*)    RDW 16.8 (*)    Neutro Abs 0.9 (*)    All other components within normal limits  LACTIC ACID, PLASMA  LACTIC ACID, PLASMA  POC OCCULT BLOOD, ED  TYPE AND SCREEN    EKG EKG Interpretation  Date/Time:  Sunday February 18 2019 17:21:05 EDT Ventricular Rate:  77 PR Interval:    QRS Duration: 90 QT Interval:  383 QTC Calculation: 434 R Axis:   -25 Text Interpretation:  Sinus rhythm Borderline left axis deviation Low voltage, precordial leads Borderline T abnormalities, anterior leads T wave inversion inferiorly Confirmed by Malvin Johns [phone]) on 02/18/2019 6:49:35 PM   Radiology Dg Chest Portable 1 View  Result Date: 02/18/2019 CLINICAL DATA:  Near syncope EXAM: PORTABLE CHEST 1 VIEW COMPARISON:  03/07/2018 FINDINGS: No focal opacity or pleural effusion. Stable cardiomediastinal silhouette. No pneumothorax. Postsurgical changes at the right humeral head. IMPRESSION: No active disease. Electronically Signed   By: Donavan Foil M.D.   On: 02/18/2019 19:01    Procedures Procedures (including critical care time)  Medications Ordered in ED Medications  sodium chloride flush (NS) 0.9 % injection 3 mL (3 mLs Intravenous Not Given 02/18/19 1754)  sodium chloride 0.9 % bolus 1,000 mL (0 mLs Intravenous Stopped 02/18/19 1856)     Initial Impression / Assessment and Plan / ED Course  I have reviewed the triage vital signs and the nursing notes.  Pertinent labs & imaging results that were available during my care of the patient were reviewed by me and considered in my medical decision making (see chart for details).        Patient presents following a near syncopal episode.  Initially hypotensive, but patient is nontoxic appearing, afebrile, not tachycardic, not tachypneic, not maintains excellent SPO2 on room air, and is in no apparent distress.  Even hypotensive she is alert and oriented. Repeat GI bleed  causing hypotension was initially high on the differential.  She did have a shift in her hemoglobin from 11.6-10.0 over the last couple days, however, Hemoccult was negative. She does have evidence of dehydration with small elevation in her creatinine and slight decrease in her CO2. Mild hypokalemia.  Patient will address this through dietary changes and follow-up on it with her PCP. Patient tested positive for COVID-19.  I suspect this may be the driving factor behind her current symptoms. Once patient was rehydrated, she was able to stand and ambulate unassisted without any gait deficit. Shared decision making conversation was had with the patient regarding admission versus discharge home with isolation.  Patient opted for discharge.  Signs and symptoms of worsening COVID-19 infection were discussed. The patient was given instructions for home care as well as return precautions. Patient voices understanding of these instructions, accepts the plan, and is comfortable with discharge.  Findings and plan of care discussed with Malvin Johns, MD. Dr. Tamera Punt personally evaluated and examined this patient.  Vitals:   02/18/19 1845 02/18/19 1900 02/18/19 1915 02/18/19 1930  BP: 109/60 103/67 94/74 113/66  Pulse:      Resp: _0 (!) 21  Temp:      TempSrc:  SpO2:      Weight:      Height:         Final Clinical Impressions(s) / ED Diagnoses   Final diagnoses:  Syncope and collapse  COVID-19    ED Discharge Orders    None       Layla Maw 02/18/19 2239    Malvin Johns, MD 02/27/19 [phone]

## 2019-02-18 NOTE — ED Provider Notes (Signed)
Patient was registered at the urgent care center to be seen for ongoing dizziness.  She had been to [facility] long emergency room on 02/16/2019 for similar symptoms.  As she was being placed into a room to receive vital signs in triage she had a syncopal episode.  She was unaware for several minutes before she was able to make eye contact and verbalize.  Blood pressure very low at 59/38.  Patient pale and clammy.  EMS was called for transfer to the emergency room.  IV started.  I remained at patient bedside until she was safely out of the department.  Albin Felling, MD 02/18/19 Lurena Nida

## 2019-02-18 NOTE — ED Notes (Addendum)
Patient is being discharged from the Urgent [facility] and sent to the Emergency Department via [facility]. Per Dr. Meda Coffee, patient is unstable and in need of higher level of care due to severe hypotension and altered LOC. Patient is aware and verbalizes understanding of plan of care.  Vitals:   02/18/19 1639  BP: (!) 59/38  Pulse: 78  Resp: 18  Temp: 98 F (36.7 C)  SpO2: 98%   Report called to Penelope Coop in the ED.

## 2019-02-19 LAB — POC OCCULT BLOOD, ED: Fecal Occult Bld: NEGATIVE

## 2019-02-20 LAB — TYPE AND SCREEN
ABO/RH(D): O POS
Antibody Screen: POSITIVE

## 2019-02-26 ENCOUNTER — Encounter (INDEPENDENT_AMBULATORY_CARE_PROVIDER_SITE_OTHER): Payer: Medicare Other | Admitting: Ophthalmology

## 2019-03-30 ENCOUNTER — Encounter (INDEPENDENT_AMBULATORY_CARE_PROVIDER_SITE_OTHER): Payer: Medicare Other | Admitting: Ophthalmology

## 2019-04-17 ENCOUNTER — Encounter (INDEPENDENT_AMBULATORY_CARE_PROVIDER_SITE_OTHER): Payer: Medicare Other | Admitting: Ophthalmology

## 2019-04-25 ENCOUNTER — Encounter (INDEPENDENT_AMBULATORY_CARE_PROVIDER_SITE_OTHER): Payer: Medicare Other | Admitting: Ophthalmology

## 2019-11-22 ENCOUNTER — Other Ambulatory Visit: Payer: Self-pay

## 2019-11-22 ENCOUNTER — Other Ambulatory Visit: Payer: Self-pay | Admitting: Internal Medicine

## 2019-11-22 ENCOUNTER — Ambulatory Visit
Admission: RE | Admit: 2019-11-22 | Discharge: 2019-11-22 | Disposition: A | Discharge status: Home / Self Care | Payer: Medicare Other | Source: Ambulatory Visit | Attending: Internal Medicine | Admitting: Internal Medicine

## 2019-11-22 DIAGNOSIS — R0902 Hypoxemia: Secondary | ICD-10-CM

## 2020-05-12 ENCOUNTER — Other Ambulatory Visit: Payer: Self-pay | Admitting: Internal Medicine

## 2020-05-14 ENCOUNTER — Other Ambulatory Visit: Payer: Self-pay | Admitting: Internal Medicine

## 2020-05-14 DIAGNOSIS — Z1231 Encounter for screening mammogram for malignant neoplasm of breast: Secondary | ICD-10-CM

## 2020-06-26 ENCOUNTER — Other Ambulatory Visit: Payer: Self-pay

## 2020-06-26 ENCOUNTER — Other Ambulatory Visit: Payer: Self-pay | Admitting: Internal Medicine

## 2020-06-26 ENCOUNTER — Ambulatory Visit
Admission: RE | Admit: 2020-06-26 | Discharge: 2020-06-26 | Disposition: A | Discharge status: Home / Self Care | Payer: Medicare PPO | Source: Ambulatory Visit | Attending: Internal Medicine | Admitting: Internal Medicine

## 2020-06-26 DIAGNOSIS — Z1231 Encounter for screening mammogram for malignant neoplasm of breast: Secondary | ICD-10-CM

## 2020-07-02 ENCOUNTER — Ambulatory Visit
Admission: RE | Admit: 2020-07-02 | Discharge: 2020-07-02 | Disposition: A | Discharge status: Home / Self Care | Payer: Medicare PPO | Source: Ambulatory Visit | Attending: Internal Medicine | Admitting: Internal Medicine

## 2020-07-02 ENCOUNTER — Other Ambulatory Visit: Payer: Self-pay | Admitting: Internal Medicine

## 2020-07-02 ENCOUNTER — Other Ambulatory Visit: Payer: Self-pay

## 2020-07-02 DIAGNOSIS — M25552 Pain in left hip: Secondary | ICD-10-CM

## 2021-04-01 ENCOUNTER — Telehealth: Payer: Self-pay | Admitting: Hematology

## 2021-04-01 NOTE — Telephone Encounter (Signed)
Scheduled appt per 10/24 referral. Pt is aware of appt date and time.  

## 2021-04-09 ENCOUNTER — Inpatient Hospital Stay: Payer: Medicare PPO | Admitting: Hematology

## 2021-04-09 ENCOUNTER — Telehealth: Payer: Self-pay | Admitting: Hematology

## 2021-04-09 NOTE — Telephone Encounter (Signed)
R/s pt's new hem appt per pt request. She called in stating she wasn't feeling well. I gave pt new appt date and time.

## 2021-04-27 ENCOUNTER — Inpatient Hospital Stay: Payer: Medicare PPO

## 2021-04-27 ENCOUNTER — Inpatient Hospital Stay: Payer: Medicare PPO | Attending: Hematology | Admitting: Hematology

## 2021-04-27 ENCOUNTER — Other Ambulatory Visit: Payer: Self-pay

## 2021-04-27 VITALS — BP 148/80 | HR 98 | Temp 97.8°F | Resp 16 | Ht 66.0 in | Wt 173.9 lb

## 2021-04-27 DIAGNOSIS — H2010 Chronic iridocyclitis, unspecified eye: Secondary | ICD-10-CM | POA: Insufficient documentation

## 2021-04-27 DIAGNOSIS — D709 Neutropenia, unspecified: Secondary | ICD-10-CM | POA: Insufficient documentation

## 2021-04-27 DIAGNOSIS — D649 Anemia, unspecified: Secondary | ICD-10-CM | POA: Insufficient documentation

## 2021-04-27 LAB — CBC WITH DIFFERENTIAL (CANCER CENTER ONLY)
Abs Immature Granulocytes: 0.02 10*3/uL (ref 0.00–0.07)
Basophils Absolute: 0 10*3/uL (ref 0.0–0.1)
Basophils Relative: 0 %
Eosinophils Absolute: 0 10*3/uL (ref 0.0–0.5)
Eosinophils Relative: 0 %
HCT: 39.3 % (ref 36.0–46.0)
Hemoglobin: 12 g/dL (ref 12.0–15.0)
Immature Granulocytes: 1 %
Lymphocytes Relative: 47 %
Lymphs Abs: 1.1 10*3/uL (ref 0.7–4.0)
MCH: 25.3 pg — ABNORMAL LOW (ref 26.0–34.0)
MCHC: 30.5 g/dL (ref 30.0–36.0)
MCV: 82.9 fL (ref 80.0–100.0)
Monocytes Absolute: 0.4 10*3/uL (ref 0.1–1.0)
Monocytes Relative: 17 %
Neutro Abs: 0.8 10*3/uL — ABNORMAL LOW (ref 1.7–7.7)
Neutrophils Relative %: 35 %
Platelet Count: 235 10*3/uL (ref 150–400)
RBC: 4.74 MIL/uL (ref 3.87–5.11)
RDW: 15.9 % — ABNORMAL HIGH (ref 11.5–15.5)
WBC Count: 2.3 10*3/uL — ABNORMAL LOW (ref 4.0–10.5)
nRBC: 0 % (ref 0.0–0.2)

## 2021-04-27 LAB — CMP (CANCER CENTER ONLY)
ALT: 14 U/L (ref 0–44)
AST: 19 U/L (ref 15–41)
Albumin: 4.5 g/dL (ref 3.5–5.0)
Alkaline Phosphatase: 122 U/L (ref 38–126)
Anion gap: 11 (ref 5–15)
BUN: 10 mg/dL (ref 8–23)
CO2: 24 mmol/L (ref 22–32)
Calcium: 10.8 mg/dL — ABNORMAL HIGH (ref 8.9–10.3)
Chloride: 106 mmol/L (ref 98–111)
Creatinine: 0.75 mg/dL (ref 0.44–1.00)
GFR, Estimated: >60 mL/min (ref >60)
Glucose, Bld: 71 mg/dL (ref 70–99)
Potassium: 3.7 mmol/L (ref 3.5–5.1)
Sodium: 141 mmol/L (ref 135–145)
Total Bilirubin: 0.5 mg/dL (ref 0.3–1.2)
Total Protein: 8.7 g/dL — ABNORMAL HIGH (ref 6.5–8.1)

## 2021-04-27 LAB — LACTATE DEHYDROGENASE: LDH: 163 U/L (ref 98–192)

## 2021-04-27 LAB — VITAMIN B12: Vitamin B-12: 2542 pg/mL — ABNORMAL HIGH (ref 180–914)

## 2021-04-27 LAB — SEDIMENTATION RATE: Sed Rate: 27 mm/hr — ABNORMAL HIGH (ref 0–22)

## 2021-04-27 LAB — VITAMIN D 25 HYDROXY (VIT D DEFICIENCY, FRACTURES): Vit D, 25-Hydroxy: 51.77 ng/mL (ref 30–100)

## 2021-04-27 NOTE — Progress Notes (Signed)
Marland Kitchen   HEMATOLOGY/ONCOLOGY CONSULTATION NOTE  Date of Service: 04/27/2021  Patient Care Team: Willey Blade, MD as PCP - General (Internal Medicine) Servando Salina, MD as Consulting Physician (Obstetrics and Gynecology)  CHIEF COMPLAINTS/PURPOSE OF CONSULTATION:  Low WBC counts  HISTORY OF PRESENTING ILLNESS:   [name] is a wonderful 75 y.o. female who has been referred to [place] by Dr Willey Blade for evaluation and management of leukopenia.  Patient has a history of concussion with dizziness and headaches for which she follows with Dr. Krista Blue in neurology.  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  This was thought to be a part of an autoimmune systemic disorder.  She notes she has been seen by Dr. Amil Amen in rheumatology and has had a discussion with rheumatology about needing to possibly be on methotrexate for treatment of her recurrent uveitis.  She previously has been treated with steroids with resolution of her symptoms. Rheumatology was concerned about her leukopenia and whether it would be safe to start methotrexate.  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  She notes that she was seen by Dr. 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 She notes no issues with recurrent unusual sinus infections, pneumonias or other signs of chronic infections.  She denies any uncontrolled severe infections that have landed on her hospitalized. Review of available labs in our system shows that the patient has had mild leukopenia since at least 2019 but as per patient's report she has had this for much longer than that.  Patient currently notes no fevers no chills no night sweats.  No unexpected weight loss.  No new lumps or bumps.  No new bone pains. Labs today CBC shows hemoglobin of 12 with an MCV of 82.9, normal platelet count of 235k,  WBC count of 2.3k with an ANC of 800.  Rest of the labs ordered today are currently pending.    MEDICAL HISTORY:  Past Medical History:  Diagnosis Date   Abnormal laboratory test 03/02/2018   Anemia    Arthritis    bilateral knees   Chest pain    Complication of anesthesia    see note about TIA-32 yrs ago   Concussion 05/11/2013   Dizziness    Head pain 07/25/15   recent blow to head   History of concussion    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   Pericarditis 04/2015   TIA (transient ischemic attack) 1988   occurred three days after anesthesia   Vertigo 08/07/2013    SURGICAL HISTORY: Past Surgical History:  Procedure Laterality Date   APPENDECTOMY     ARTERY BIOPSY Left 03/09/2018   Procedure: BIOPSY LEFT TEMPORAL ARTERY;  Surgeon: Judeth Horn, MD;  Location: [facility];  Service: General;  Laterality: Left;  MAC   CERVICAL CONE BIOPSY     COLONOSCOPY     [place] N/A 08/08/2015   Procedure: [place];  Surgeon: Servando Salina, MD;  Location: [facility];  Service: Gynecology;  Laterality: N/A;   TONSILLECTOMY     TUBAL LIGATION      SOCIAL HISTORY: Social History   Socioeconomic History   Marital status: Married    Spouse name: Athelstan   Number of children: 6   Years of education: 19   Highest education level: Master's degree (e.g.,  MA, MS, MEng, MEd, MSW, MBA)  Occupational History   Occupation: Retired  Tobacco Use   Smoking status: Never   Smokeless tobacco: Never  Vaping Use   Vaping Use: Never used  Substance and Sexual Activity   Alcohol use: No   Drug use: No   Sexual activity: Yes    Birth control/protection: Post-menopausal  Other Topics Concern   Not on file  Social History Narrative   Patient is married Psychologist, counselling) and lives at home with her husband and her daughter.   Patient has five living children and one is deceased.   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   Patient is right handed.   Uses very little caffeine.   Social Determinants of Health   Financial Resource Strain: Not on file  Food Insecurity: Not on file  Transportation Needs: Not on file  Physical Activity: Not on file  Stress: Not on file  Social Connections: Not on file  Intimate Partner Violence: Not on file    FAMILY HISTORY: Family History  Problem Relation Age of Onset   Cancer Mother    Heart disease Father    Heart failure Father    Coronary artery disease Other     ALLERGIES:  has No Known Allergies.  MEDICATIONS:  Current Outpatient Medications  Medication Sig Dispense Refill   acetaminophen (TYLENOL) 500 MG tablet Take 1,000 mg by mouth every 6 (six) hours as needed for moderate pain.     Ascorbic Acid (VITAMIN C) 1000 MG tablet Take 1,000 mg by mouth daily.     b complex vitamins tablet Take 1 tablet by mouth daily.     BLACK CURRANT SEED OIL PO Take 2.5 mLs by mouth daily.      Black Elderberry,Berry-Flower, 575 MG CAPS Take 2 capsules by mouth daily.     Cholecalciferol (VITAMIN D) 2000 units tablet Take 2,000 Units by mouth daily.     COCONUT OIL-FLAXSEED OIL PO Take 15 mLs by mouth daily.     Docosahexaenoic Acid (DHA COMPLETE PO) Take 1 tablet by mouth every other day.      Ginkgo Biloba (GINKOBA PO) Take 1 tablet by mouth daily.     Glucosamine HCl (GLUCOSAMINE PO) Take 3 capsules by mouth daily.     Iron-Vitamin C (IRON 100/C) 100-250 MG TABS Take 1 capsule by mouth daily.      loteprednol (LOTEMAX) 0.5 % ophthalmic suspension Place 1 drop into the left eye 2 (two) times daily.      MAGNESIUM GLYCINATE PLUS PO Take 400 mg by mouth daily.      meclizine (ANTIVERT) 12.5 MG tablet Take 1-2 tablets (12.5-25 mg total) by mouth 3 (three) times daily as needed for dizziness. 20 tablet 0   pantoprazole (PROTONIX) 40 MG tablet Take 40 mg by mouth 2 (two) times daily.     Polyvinyl Alcohol-Povidone (REFRESH OP)  Apply 1 drop to eye daily as needed (dry eyes).     Probiotic Product (PROBIOTIC & ACIDOPHILUS EX ST PO) Take 1 tablet by mouth daily.      Protein POWD Take 1 scoop by mouth daily. Mix with water     Specialty Vitamins Products (ONE-A-DAY BONE STRENGTH PO) Take 3 tablets by mouth daily.     UNABLE TO FIND Take 5 mLs by mouth daily. Med Name: MCT oil     No current facility-administered medications for this visit.    REVIEW OF SYSTEMS:    10  Point review of Systems was done is negative except as noted above.  PHYSICAL EXAMINATION: ECOG PERFORMANCE STATUS: 2 - Symptomatic, <50% confined to bed  . Vitals:   04/27/21 1315  BP: (!) 148/80  Pulse: 98  Resp: 16  Temp: 97.8 F (36.6 C)  SpO2: 100%   Filed Weights   04/27/21 1315  Weight: 173 lb 14.4 oz (78.9 kg)   .Body mass index is 28.07 kg/m.  GENERAL:alert, in no acute distress and comfortable SKIN: no acute rashes, no significant lesions EYES: conjunctiva are pink and non-injected, sclera anicteric OROPHARYNX: MMM, no exudates, no oropharyngeal erythema or ulceration NECK: supple, no JVD LYMPH:  no palpable lymphadenopathy in the cervical, axillary or inguinal regions LUNGS: clear to auscultation b/l with normal respiratory effort HEART: regular rate & rhythm ABDOMEN:  normoactive bowel sounds , non tender, not distended. Extremity: no pedal edema PSYCH: alert & oriented x 3 with fluent speech NEURO: no focal motor/sensory deficits  LABORATORY DATA:  I have reviewed the data as listed  . CBC Latest Ref Rng & Units 04/27/2021 04/27/2021 02/18/2019  WBC 4.0 - 10.5 K/uL 2.3(L) - 3.4(L)  Hemoglobin 12.0 - 15.0 g/dL 12.0 - 10.0(L)  Hematocrit 34.0 - 46.6 % 39.6 39.3 33.8(L)  Platelets 150 - 400 K/uL 235 - 176   .CBC    Component Value Date/Time   WBC 3.4 (L) 02/18/2019 1805   RBC 3.73 (L) 02/18/2019 1805   HGB 10.0 (L) 02/18/2019 1805   HGB 12.6 03/02/2018 1613   HGB 11.2 (L) 11/12/2010 1126   HCT 33.8 (L)  02/18/2019 1805   HCT 40.5 03/02/2018 1613   HCT 35.5 11/12/2010 1126   PLT 176 02/18/2019 1805   PLT 358 03/02/2018 1613   MCV 90.6 02/18/2019 1805   MCV 82 03/02/2018 1613   MCV 82 11/12/2010 1126   MCH 26.8 02/18/2019 1805   MCHC 29.6 (L) 02/18/2019 1805   RDW 16.8 (H) 02/18/2019 1805   RDW 15.3 03/02/2018 1613   RDW 16.2 (H) 11/12/2010 1126   LYMPHSABS 1.6 02/18/2019 1805   LYMPHSABS 1.2 03/02/2018 1613   LYMPHSABS 1.5 11/12/2010 1126   MONOABS 0.9 02/18/2019 1805   EOSABS 0.0 02/18/2019 1805   EOSABS 0.0 03/02/2018 1613   EOSABS 0.0 11/12/2010 1126   BASOSABS 0.0 02/18/2019 1805   BASOSABS 0.0 03/02/2018 1613   BASOSABS 0.1 11/12/2010 1126     . CMP Latest Ref Rng & Units 02/18/2019 02/16/2019 03/07/2018  Glucose 70 - 99 mg/dL 120(H) 84 71  BUN 8 - 23 mg/dL _0 Creatinine 0.44 - 1.00 mg/dL 1.06(H) 0.89 0.84  Sodium 135 - 145 mmol/L 137 138 141  Potassium 3.5 - 5.1 mmol/L 3.1(L) 3.1(L) 3.9  Chloride 98 - 111 mmol/L 105 104 109  CO2 22 - 32 mmol/L 21(L) 24 23  Calcium 8.9 - 10.3 mg/dL 8.8(L) 10.2 10.2  Total Protein 6.5 - 8.1 g/dL 6.2(L) 8.2(H) -  Total Bilirubin 0.3 - 1.2 mg/dL 0.5 0.5 -  Alkaline Phos 38 - 126 U/L 58 77 -  AST 15 - 41 U/L 24 22 -  ALT 0 - 44 U/L 11 12 -     RADIOGRAPHIC STUDIES: I have personally reviewed the radiological images as listed and agreed with the findings in the report. No results found.  ASSESSMENT & PLAN:   75 year old female with history of recurrent uveitis thought to be represent an autoimmune systemic condition.  She is following with ophthalmology and rheumatology [Dr. Amil Amen  to address this.  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 Patient has had variable leukopenia and neutropenia since at least 2019 based on lab review. Patient however notes she has had this for more than 10 years and has been previously evaluated by Dr. 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 Get the possibility in the setting of autoimmune condition is that she could have immune neutropenia as a part of her systemic disorder. HIV testing was negative in 2021. Patient denies using any drugs including cocaine. Patient has not required any PRBC transfusions. Plan -The chronic element of the patient's leukopenia/neutropenia is reassuring and ruled out several more significant concerning possibilities. 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 Marin Olp more than 10 years ago.  The fact that her WBC counts improved in the situations of stress he is consistent with this.. -The other possibility is neutropenia could be a part of her autoimmune condition and could be an immune neutropenia which might improve with treatment. -Work-up requested to evaluate other causes of her neutropenia -as noted below. -If there is any concern on her peripheral blood smear or lab work-up we will consider doing a bone marrow biopsy.  Follow-up Labs today Phone visit with Dr Irene Limbo in about 1 week . Orders Placed This Encounter  Procedures   CBC with Differential ([facility] Only)    Standing Status:   Future    Number of Occurrences:   1    Standing Expiration Date:   04/27/2022   CMP ([facility] only)    Standing Status:   Future    Number of Occurrences:   1    Standing Expiration Date:   04/27/2022   Lactate dehydrogenase    Standing Status:   Future    Number of Occurrences:   1    Standing Expiration Date:   04/27/2022   Vitamin B12    Standing Status:   Future    Number of Occurrences:   1    Standing Expiration Date:   04/27/2022   Sedimentation rate    Standing Status:   Future    Number of Occurrences:   1    Standing Expiration Date:   04/27/2022   Anti-parietal antibody    Standing Status:   Future    Number of Occurrences:   1    Standing Expiration Date:   04/27/2022   Intrinsic  factor antibodies    Standing Status:   Future    Number of Occurrences:   1    Standing Expiration Date:   04/27/2022   TSH    Standing Status:   Future    Number of Occurrences:   1    Standing Expiration Date:   04/27/2022   Copper, serum    Standing Status:   Future    Number of Occurrences:   1    Standing Expiration Date:   04/27/2022   Folate RBC    Standing Status:   Future    Number of Occurrences:   1    Standing Expiration Date:   04/27/2022   Multiple Myeloma Panel (SPEP&IFE w/QIG)    Standing Status:   Future    Number of Occurrences:   1    Standing Expiration Date:   04/27/2022   Kappa/lambda light chains    Standing Status:   Future    Number of  Occurrences:   1    Standing Expiration Date:   04/27/2022   Ferritin    Standing Status:   Future    Number of Occurrences:   1    Standing Expiration Date:   04/27/2022   Iron and TIBC    Standing Status:   Future    Number of Occurrences:   1    Standing Expiration Date:   04/27/2022   Vitamin D 25 hydroxy    Standing Status:   Future    Number of Occurrences:   1    Standing Expiration Date:   04/27/2022      All of the patients questions were answered with apparent satisfaction. The patient knows to call the clinic with any problems, questions or concerns.  I spent 40 minutes counseling the patient face to face. The total time spent in the appointment was 60 minutes and more than 50% was on counseling and direct patient cares.    Sullivan Lone MD Jenkins AAHIVMS [facility] Hematology/Oncology Physician [facility]  (Office):       [phone] (Work cell):  [phone] (Fax):           [phone]  04/27/2021 1:32 PM

## 2021-04-28 ENCOUNTER — Telehealth: Payer: Self-pay | Admitting: Hematology

## 2021-04-28 LAB — KAPPA/LAMBDA LIGHT CHAINS
Kappa free light chain: 31.8 mg/L — ABNORMAL HIGH (ref 3.3–19.4)
Kappa, lambda light chain ratio: 1.86 — ABNORMAL HIGH (ref 0.26–1.65)
Lambda free light chains: 17.1 mg/L (ref 5.7–26.3)

## 2021-04-28 LAB — IRON AND TIBC
Iron: 63 ug/dL (ref 41–142)
Saturation Ratios: 24 % (ref 21–57)
TIBC: 266 ug/dL (ref 236–444)
UIBC: 204 ug/dL (ref 120–384)

## 2021-04-28 LAB — TSH: TSH: 0.545 u[IU]/mL (ref 0.308–3.960)

## 2021-04-28 LAB — ANTI-PARIETAL ANTIBODY: Parietal Cell Antibody-IgG: 4.9 Units (ref 0.0–20.0)

## 2021-04-28 LAB — FOLATE RBC
Folate, Hemolysate: >620 ng/mL
Folate, RBC: >1566 ng/mL (ref >498)
Hematocrit: 39.6 % (ref 34.0–46.6)

## 2021-04-28 LAB — INTRINSIC FACTOR ANTIBODIES: Intrinsic Factor: 0.9 AU/mL (ref 0.0–1.1)

## 2021-04-28 LAB — FERRITIN: Ferritin: 86 ng/mL (ref 11–307)

## 2021-04-28 NOTE — Telephone Encounter (Signed)
Scheduled follow-up appointment per 11/21 los. Patient is aware. 

## 2021-05-01 LAB — COPPER, SERUM: Copper: 178 ug/dL — ABNORMAL HIGH (ref 80–158)

## 2021-05-04 ENCOUNTER — Inpatient Hospital Stay (HOSPITAL_BASED_OUTPATIENT_CLINIC_OR_DEPARTMENT_OTHER): Payer: Medicare PPO | Admitting: Hematology

## 2021-05-04 DIAGNOSIS — D709 Neutropenia, unspecified: Secondary | ICD-10-CM

## 2021-05-04 NOTE — Progress Notes (Signed)
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  HISTORY OF PRESENTING ILLNESS:   [name] is a wonderful 75 y.o. female who has been referred to [place] by Dr Willey Blade for evaluation and management of leukopenia.  Patient has a history of concussion with dizziness and headaches for which she follows with Dr. Krista Blue in neurology.  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  This was thought to be a part of an autoimmune systemic disorder.  She notes she has been seen by Dr. Amil Amen in rheumatology and has had a discussion with rheumatology about needing to possibly be on methotrexate for treatment of her recurrent uveitis.  She previously has been treated with steroids with resolution of her symptoms. Rheumatology was concerned about her leukopenia and whether it would be safe to start methotrexate.  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  She notes that she was seen by Dr. 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 She notes no issues with recurrent unusual sinus infections, pneumonias or other signs of chronic infections.  She denies any uncontrolled severe infections that have landed on her hospitalized. Review of available labs in our system shows that the patient has had mild leukopenia since at least 2019 but as per patient's report she has had this for much longer than that.  Patient currently notes no fevers no chills no night sweats.  No unexpected weight loss.  No new lumps or bumps.  No new bone pains. Labs today CBC shows  hemoglobin of 12 with an MCV of 82.9, normal platelet count of 235k, WBC count of 2.3k with an ANC of 800.  Rest of the labs ordered today are currently pending.   INTERVAL HISTORY  .I connected with [name] on .11/28/2022at  3:20 PM EST by telephone visit and verified that I am speaking with the correct person using two identifiers.   I discussed the limitations, risks, security and privacy concerns of performing an evaluation and management service by telemedicine and the availability of in-person appointments. I also discussed with the patient that there may be a patient responsible charge related to this service. The patient expressed understanding and agreed to proceed.   Patient's location: Home Provider's location: [place]  Chief Complaint: Follow-up to discuss labs done for evaluation of leukopenia.  Patient was called to discuss her labs sent out for evaluation of her chronic leukopenia.  She notes no acute new symptoms. Her CBC shows a WBC count that is a little lower today at 2.3k with an [place] of 800.  Hemoglobin is 12 with an MCV of 83 and a platelet count of 235k CMP unremarkable other than calcium level of 10.8 with increase in total protein to 8.7 TSH within normal limits of 0.545 Copper 178 B12 adequately replaced at 2500 RBC folate within normal limits Antiparietal cell and antiintrinsic factor antibody is negative Vitamin D 51.77 Sedimentation rate 27 Kappa lambda free light chain ratio 1.86 with kappa free light chain slightly elevated at 31.8 Multiple myeloma panel no M spike noted.  IFE shows faint band in lambda. LDH within  normal limits at 163   MEDICAL HISTORY:  Past Medical History:  Diagnosis Date   Abnormal laboratory test 03/02/2018   Anemia    Arthritis    bilateral knees   Chest pain    Complication of anesthesia    see note about TIA-32 yrs ago   Concussion 05/11/2013   Dizziness    Head pain 07/25/15   recent blow to  head   History of concussion    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   Pericarditis 04/2015   TIA (transient ischemic attack) 1988   occurred three days after anesthesia   Vertigo 08/07/2013    SURGICAL HISTORY: Past Surgical History:  Procedure Laterality Date   APPENDECTOMY     ARTERY BIOPSY Left 03/09/2018   Procedure: BIOPSY LEFT TEMPORAL ARTERY;  Surgeon: Judeth Horn, MD;  Location: [place];  Service: General;  Laterality: Left;  MAC   CERVICAL CONE BIOPSY     COLONOSCOPY     [place] N/A 08/08/2015   Procedure: [place];  Surgeon: Servando Salina, MD;  Location: [facility];  Service: Gynecology;  Laterality: N/A;   TONSILLECTOMY     TUBAL LIGATION      SOCIAL HISTORY: Social History   Socioeconomic History   Marital status: Married    Spouse name: Customer service manager   Number of children: 6   Years of education: 40   Highest education level: Master's degree (e.g., MA, MS, MEng, MEd, MSW, MBA)  Occupational History   Occupation: Retired  Tobacco Use   Smoking status: Never   Smokeless tobacco: Never  Vaping Use   Vaping Use: Never used  Substance and Sexual Activity   Alcohol use: No   Drug use: No   Sexual activity: Yes    Birth control/protection: Post-menopausal  Other Topics Concern   Not on file  Social History Narrative   Patient is married Psychologist, counselling) and lives at home with her husband and her daughter.   Patient has five living children and one is deceased.   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   Patient is right handed.   Uses very little caffeine.   Social Determinants of Health   Financial Resource Strain: Not on file  Food Insecurity: Not on file  Transportation Needs: Not on file  Physical Activity: Not on file  Stress: Not on file  Social Connections: Not on file  Intimate Partner Violence: Not on file    FAMILY  HISTORY: Family History  Problem Relation Age of Onset   Cancer Mother    Heart disease Father    Heart failure Father    Coronary artery disease Other     ALLERGIES:  has No Known Allergies.  MEDICATIONS:  Current Outpatient Medications  Medication Sig Dispense Refill   acetaminophen (TYLENOL) 500 MG tablet Take 1,000 mg by mouth every 6 (six) hours as needed for moderate pain.     Ascorbic Acid (VITAMIN C) 1000 MG tablet Take 1,000 mg by mouth daily.     b complex vitamins tablet Take 1 tablet by mouth daily.     BLACK CURRANT SEED OIL PO Take 2.5 mLs by mouth daily.      Black Elderberry,Berry-Flower, 575 MG CAPS Take 2 capsules by mouth daily.     Cholecalciferol (VITAMIN D) 2000 units tablet Take 2,000 Units by mouth daily.     COCONUT OIL-FLAXSEED OIL PO Take 15  mLs by mouth daily.     Docosahexaenoic Acid (DHA COMPLETE PO) Take 1 tablet by mouth every other day.      Ginkgo Biloba (GINKOBA PO) Take 1 tablet by mouth daily.     Glucosamine HCl (GLUCOSAMINE PO) Take 3 capsules by mouth daily.     Iron-Vitamin C (IRON 100/C) 100-250 MG TABS Take 1 capsule by mouth daily.      loteprednol (LOTEMAX) 0.5 % ophthalmic suspension Place 1 drop into the left eye 2 (two) times daily.      MAGNESIUM GLYCINATE PLUS PO Take 400 mg by mouth daily.      meclizine (ANTIVERT) 12.5 MG tablet Take 1-2 tablets (12.5-25 mg total) by mouth 3 (three) times daily as needed for dizziness. 20 tablet 0   pantoprazole (PROTONIX) 40 MG tablet Take 40 mg by mouth 2 (two) times daily.     Polyvinyl Alcohol-Povidone (REFRESH OP) Apply 1 drop to eye daily as needed (dry eyes).     Probiotic Product (PROBIOTIC & ACIDOPHILUS EX ST PO) Take 1 tablet by mouth daily.      Protein POWD Take 1 scoop by mouth daily. Mix with water     Specialty Vitamins Products (ONE-A-DAY BONE STRENGTH PO) Take 3 tablets by mouth daily.     UNABLE TO FIND Take 5 mLs by mouth daily. Med Name: MCT oil     No current  facility-administered medications for this visit.    REVIEW OF SYSTEMS:    .10 Point review of Systems was done is negative except as noted above.  PHYSICAL EXAMINATION: Phone visit  LABORATORY DATA:  I have reviewed the data as listed . CBC Latest Ref Rng & Units 04/27/2021 04/27/2021 02/18/2019  WBC 4.0 - 10.5 K/uL 2.3(L) - 3.4(L)  Hemoglobin 12.0 - 15.0 g/dL 12.0 - 10.0(L)  Hematocrit 34.0 - 46.6 % 39.6 39.3 33.8(L)  Platelets 150 - 400 K/uL 235 - 176   .CBC    Component Value Date/Time   WBC 2.3 (L) 04/27/2021 1420   WBC 3.4 (L) 02/18/2019 1805   RBC 4.74 04/27/2021 1420   HGB 12.0 04/27/2021 1420   HGB 12.6 03/02/2018 1613   HGB 11.2 (L) 11/12/2010 1126   HCT 39.6 04/27/2021 1427   HCT 39.3 04/27/2021 1420   HCT 35.5 11/12/2010 1126   PLT 235 04/27/2021 1420   PLT 358 03/02/2018 1613   MCV 82.9 04/27/2021 1420   MCV 82 03/02/2018 1613   MCV 82 11/12/2010 1126   MCH 25.3 (L) 04/27/2021 1420   MCHC 30.5 04/27/2021 1420   RDW 15.9 (H) 04/27/2021 1420   RDW 15.3 03/02/2018 1613   RDW 16.2 (H) 11/12/2010 1126   LYMPHSABS 1.1 04/27/2021 1420   LYMPHSABS 1.2 03/02/2018 1613   LYMPHSABS 1.5 11/12/2010 1126   MONOABS 0.4 04/27/2021 1420   EOSABS 0.0 04/27/2021 1420   EOSABS 0.0 03/02/2018 1613   EOSABS 0.0 11/12/2010 1126   BASOSABS 0.0 04/27/2021 1420   BASOSABS 0.0 03/02/2018 1613   BASOSABS 0.1 11/12/2010 1126    CMP Latest Ref Rng & Units 04/27/2021 02/18/2019 02/16/2019  Glucose 70 - 99 mg/dL 71 120(H) 84  BUN 8 - 23 mg/dL _0 Creatinine 0.44 - 1.00 mg/dL 0.75 1.06(H) 0.89  Sodium 135 - 145 mmol/L 141 137 138  Potassium 3.5 - 5.1 mmol/L 3.7 3.1(L) 3.1(L)  Chloride 98 - 111 mmol/L 106 105 104  CO2 22 - 32 mmol/L 24 21(L) 24  Calcium 8.9 - 10.3  mg/dL 10.8(H) 8.8(L) 10.2  Total Protein 6.5 - 8.1 g/dL 8.7(H) 6.2(L) 8.2(H)  Total Bilirubin 0.3 - 1.2 mg/dL 0.5 0.5 0.5  Alkaline Phos 38 - 126 U/L 122 58 77  AST 15 - 41 U/L _0 ALT 0 - 44 U/L _1 RADIOGRAPHIC STUDIES: I have personally reviewed the radiological images as listed and agreed with the findings in the report. No results found.  ASSESSMENT & PLAN:   75 year old female with history of recurrent uveitis thought to be represent an autoimmune systemic condition.  She is following with ophthalmology and rheumatology [Dr. Amil Amen to address this.  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 Patient has had variable leukopenia and neutropenia since at least 2019 based on lab review. Patient however notes she has had this for more than 10 years and has been previously evaluated by Dr. 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 Get the possibility in the setting of autoimmune condition is that she could have immune neutropenia as a part of her systemic disorder. HIV testing was negative in 2021. Patient denies using any drugs including cocaine. Patient has not required any PRBC transfusions. Plan -Labs reviewed with the patient- CBC shows a WBC count that is a little lower today at 2.3k with an [place] of 800.  Hemoglobin is 12 with an MCV of 83 and a platelet count of 235k CMP unremarkable other than calcium level of 10.8 with increase in total protein to 8.7 TSH within normal limits of 0.545 Copper 178 B12 adequately replaced at 2500 RBC folate within normal limits Antiparietal cell and antiintrinsic factor antibody is negative Vitamin D 51.77 Sedimentation rate 27 Kappa lambda free light chain ratio 1.86 with kappa free light chain slightly elevated at 31.8 Multiple myeloma panel no M spike noted.  IFE shows faint band in lambda. LDH within normal limits at 163 -The chronic element of the patient's leukopenia/neutropenia is reassuring and ruled out several more significant concerning possibilities. 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 Marin Olp more than 10 years ago.  The fact that her WBC counts improved in the situations of stress he is consistent with this.. -The other possibility is neutropenia could be a part of her autoimmune condition and could be an immune neutropenia which might improve with treatment. -All the patient's questions about her leukopenia were answered in details. -She would like to hold off on the bone marrow evaluation at this time and considering the chronic nature of her leukopenia this is not unreasonable. -Patient notes her last uveitis was more than a year ago and she is not keen to start on methotrexate.  2) borderline hypercalcemia with a calcium of of 10.8.  Likely due to some dehydration Myeloma panel shows no M spike Kappa lambda free light chains minimal elevation in kappa light chains. Lab -Patient recommended optimizing her p.o. fluid intake Rpt labs with PCP in 1-2 month to evaluate for resolution of borderline hypercalcemia  Follow-up RTC with Dr Irene Limbo with labs in 6 months Rpt labs with PCP in 1-2 month to evaluate for resolution of borderline hypercalcemia  No orders of the defined types were placed in this encounter.   All of the patients questions were answered with apparent satisfaction. The patient knows to call the clinic with any problems, questions or concerns.  . The total time spent  in the appointment was 20 minutes and more than 50% was on counseling and direct patient cares.    I,Zite Okoli,acting as a Education administrator for Sullivan Lone, MD.,have documented all relevant documentation on the behalf of Sullivan Lone, MD,as directed by  Sullivan Lone, MD while in the presence of Sullivan Lone, MD.  .I have reviewed the above documentation for accuracy and completeness, and I agree with the above.   Sullivan Lone MD [place] AAHIVMS [facility] Hematology/Oncology Physician [place] 05/04/2021

## 2021-05-05 ENCOUNTER — Telehealth: Payer: Self-pay | Admitting: Hematology

## 2021-05-05 LAB — MULTIPLE MYELOMA PANEL, SERUM
Albumin SerPl Elph-Mcnc: 3.9 g/dL (ref 2.9–4.4)
Albumin/Glob SerPl: 1 (ref 0.7–1.7)
Alpha 1: 0.3 g/dL (ref 0.0–0.4)
Alpha2 Glob SerPl Elph-Mcnc: 0.9 g/dL (ref 0.4–1.0)
B-Globulin SerPl Elph-Mcnc: 1.2 g/dL (ref 0.7–1.3)
Gamma Glob SerPl Elph-Mcnc: 1.6 g/dL (ref 0.4–1.8)
Globulin, Total: 4 g/dL — ABNORMAL HIGH (ref 2.2–3.9)
IgA: 380 mg/dL (ref 64–422)
IgG (Immunoglobin G), Serum: 1509 mg/dL (ref 586–1602)
IgM (Immunoglobulin M), Srm: 163 mg/dL (ref 26–217)
Total Protein ELP: 7.9 g/dL (ref 6.0–8.5)

## 2021-05-05 NOTE — Telephone Encounter (Signed)
Scheduled follow-up appointment per 11/28 los. Patient is aware. 

## 2021-05-07 ENCOUNTER — Telehealth: Payer: Self-pay | Admitting: *Deleted

## 2021-05-07 NOTE — Progress Notes (Signed)
Per pt's request labs faxed to Dr Dierdre Forth.

## 2021-05-07 NOTE — Telephone Encounter (Signed)
All recent labs faxed to [facility]

## 2021-09-24 ENCOUNTER — Telehealth: Payer: Self-pay | Admitting: Hematology

## 2021-09-24 NOTE — Telephone Encounter (Signed)
Per provider reschedule called and spoke to pt about appointment change.  Pt confirmed appointment  ?

## 2021-10-27 ENCOUNTER — Ambulatory Visit: Payer: Medicare PPO | Admitting: Hematology

## 2021-10-27 ENCOUNTER — Other Ambulatory Visit: Payer: Medicare PPO

## 2021-10-29 ENCOUNTER — Inpatient Hospital Stay: Payer: Medicare PPO

## 2021-10-29 ENCOUNTER — Inpatient Hospital Stay: Payer: Medicare PPO | Admitting: Hematology

## 2023-07-31 ENCOUNTER — Ambulatory Visit
Admission: EM | Admit: 2023-07-31 | Discharge: 2023-07-31 | Disposition: A | Discharge status: Home / Self Care | Payer: Medicare PPO | Attending: Family Medicine | Admitting: Family Medicine

## 2023-07-31 ENCOUNTER — Ambulatory Visit: Payer: Self-pay

## 2023-07-31 DIAGNOSIS — R531 Weakness: Secondary | ICD-10-CM | POA: Diagnosis not present

## 2023-07-31 DIAGNOSIS — H6121 Impacted cerumen, right ear: Secondary | ICD-10-CM

## 2023-07-31 DIAGNOSIS — B349 Viral infection, unspecified: Secondary | ICD-10-CM

## 2023-07-31 LAB — POCT URINALYSIS DIP (MANUAL ENTRY)
Bilirubin, UA: NEGATIVE
Glucose, UA: NEGATIVE mg/dL
Ketones, POC UA: NEGATIVE mg/dL
Leukocytes, UA: NEGATIVE
Nitrite, UA: NEGATIVE
Protein Ur, POC: NEGATIVE mg/dL
Spec Grav, UA: <=1.005 — AB (ref 1.010–1.025)
Urobilinogen, UA: 0.2 U/dL
pH, UA: 6 (ref 5.0–8.0)

## 2023-07-31 LAB — POC COVID19/FLU A&B COMBO
Covid Antigen, POC: NEGATIVE
Influenza A Antigen, POC: NEGATIVE
Influenza B Antigen, POC: NEGATIVE

## 2023-07-31 MED ORDER — BENZONATATE 100 MG PO CAPS: 100.0000 mg | ORAL_CAPSULE | Freq: Three times a day (TID) | ORAL | 0 refills | Status: DC

## 2023-07-31 NOTE — Discharge Instructions (Signed)
 You may take Tessalon 3 times a day as needed for your cough.  Please follow-up with your PCP tomorrow for further workup and treatment of your symptoms.  Please go to the ER if you develop any worsening symptoms.  Hope you feel better soon!

## 2023-07-31 NOTE — ED Provider Notes (Addendum)
 [facility]    CSN: [id number] Arrival date & time: 07/31/23  1310      History   Chief Complaint Chief Complaint  Patient presents with   Weakness   Fatigue         HPI [name] is a 78 y.o. female presents with daughter for weakness, fatigue.  Patient daughter reports 1 week of weakness, fatigue, feeling off balance with patient reporting "I feel like I do not control my body", sore throat, chills and cough.  No fevers, headaches, dysuria, flank pain, neck pain, nausea/vomiting/diarrhea, shortness of breath, chest pain, syncope or near syncope, dizziness, ear pain.  States she will be walking and then she loses her balance and falls into a wall.  No head injuries.  No asthma or smoking history.  Daughter reports she has a "inflammatory" condition that presents in this manner.  Daughter states she has had all the workup/labs done and they cannot pinpoint the exact cause.  Daughter states the only thing that makes the symptoms better for her is prednisone.  States she has been staying hydrated.  No OTC medications have been used since onset.  No other concerns at this time.   Weakness Associated symptoms: cough     Past Medical History:  Diagnosis Date   Abnormal laboratory test 03/02/2018   Anemia    Arthritis    bilateral knees   Chest pain    Complication of anesthesia    see note about TIA-32 yrs ago   Concussion 05/11/2013   Dizziness    Head pain 07/25/15   recent blow to head   History of concussion    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   Pericarditis 04/2015   TIA (transient ischemic attack) 1988   occurred three days after anesthesia   Vertigo 08/07/2013    Patient Active Problem List   Diagnosis Date Noted   Elevated blood pressure reading 10/12/2018   History of pericarditis 10/12/2018   Abnormal laboratory test 03/02/2018   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   Chest pain 04/09/2014   Vertigo 08/07/2013   Head pain    Concussion 05/11/2013   Headache 05/11/2013     Past Surgical History:  Procedure Laterality Date   APPENDECTOMY     ARTERY BIOPSY Left 03/09/2018   Procedure: BIOPSY LEFT TEMPORAL ARTERY;  Surgeon: Jimmye Norman, MD;  Location: [facility];  Service: General;  Laterality: Left;  MAC   CERVICAL CONE BIOPSY     COLONOSCOPY     CYSTOCELE REPAIR     DILATATION & CURETTAGE/HYSTEROSCOPY WITH MYOSURE N/A 08/08/2015   Procedure: DILATATION & CURETTAGE/HYSTEROSCOPY WITH MYOSURE;  Surgeon: Maxie Better, MD;  Location: [facility];  Service: Gynecology;  Laterality: N/A;   TONSILLECTOMY     TUBAL LIGATION      OB History   No obstetric history on file.      Home Medications    Prior to Admission medications   Medication Sig Start Date End Date Taking? Authorizing Provider  benzonatate (TESSALON) 100 MG capsule Take 1 capsule (100 mg total) by mouth every 8 (eight) hours. 07/31/23  Yes Radford Pax, NP  acetaminophen (TYLENOL) 500 MG tablet Take 1,000 mg by mouth every 6 (six) hours as needed for moderate pain.    [provider]  Ascorbic Acid (VITAMIN C) 1000 MG tablet Take 1,000 mg by mouth daily.    [provider]  b complex vitamins tablet Take 1 tablet by mouth daily.    [provider]  Biotin 1 MG CAPS 1 cap    [provider]  BLACK CURRANT SEED OIL PO Take 2.5 mLs by mouth daily.     [provider]  Black Elderberry,Berry-Flower, 575 MG CAPS Take 2 capsules by mouth daily.    [provider]  Cholecalciferol (VITAMIN D) 2000 units tablet Take 2,000 Units by mouth daily.    [provider]  Cholecalciferol (VITAMIN D3) 50 MCG (2000 UT) capsule Vitamin D3    [provider]  COCONUT OIL-FLAXSEED OIL PO Take 15 mLs by mouth daily.    [provider]  Docosahexaenoic Acid (DHA COMPLETE PO) Take 1 tablet by mouth every other day.     [provider]  Ginkgo Biloba (GINKOBA PO) Take 1 tablet by mouth daily.    [provider]  Glucosamine HCl (GLUCOSAMINE PO) Take 3 capsules by mouth daily.    [provider]  Iron-Vitamin C (IRON 100/C) 100-250 MG TABS Take 1 capsule by mouth daily.     [provider]  loteprednol (LOTEMAX) 0.5 % ophthalmic suspension Place 1 drop into the left eye 2 (two) times daily.  12/01/18   [provider]  MAGNESIUM GLYCINATE PLUS PO Take 400 mg by mouth daily.     [provider]  meclizine (ANTIVERT) 12.5 MG tablet Take 1-2 tablets (12.5-25 mg total) by mouth 3 (three) times daily as needed for dizziness. 02/17/19   Antony Madura, PA-C  pantoprazole (PROTONIX) 40 MG tablet Take 40 mg by mouth 2 (two) times daily.    [provider]  Polyvinyl Alcohol-Povidone (REFRESH OP) Apply 1 drop to eye daily as needed (dry eyes).    [provider]  Probiotic Product (PROBIOTIC & ACIDOPHILUS EX ST PO) Take 1 tablet by mouth daily.     [provider]  Protein POWD Take 1 scoop by mouth daily. Mix with water    [provider]  Specialty Vitamins Products (ONE-A-DAY BONE STRENGTH PO) Take 3 tablets by mouth daily.    [provider]  UNABLE TO FIND Take 5 mLs by mouth daily. Med Name: MCT oil    [provider]    Family History Family History  Problem Relation Age of Onset   Cancer Mother    Heart disease Father    Heart failure Father    Coronary artery disease Other     Social History Social History   Tobacco Use   Smoking status: Never   Smokeless tobacco: Never  Vaping Use   Vaping status: Never Used  Substance Use Topics   Alcohol use: No   Drug use: No     Allergies   Patient has no known allergies.   Review of Systems Review of Systems  Constitutional:  Positive for fatigue.  HENT:  Positive for congestion and sore throat.   Respiratory:  Positive for cough.   Neurological:  Positive for weakness.     Physical Exam Triage Vital Signs ED Triage Vitals [07/31/23  1338]  Encounter Vitals Group     BP 111/69     Systolic BP Percentile      Diastolic BP Percentile      Pulse Rate 81     Resp 16     Temp 98 F (36.7 C)     Temp Source Oral     SpO2 97 %     Weight      Height      Head Circumference  Peak Flow      Pain Score 0     Pain Loc      Pain Education      Exclude from Growth Chart    No data found.  Updated Vital Signs BP 111/69 (BP Location: Left Arm)   Pulse 81   Temp 98 F (36.7 C) (Oral)   Resp 16   SpO2 97%   Visual Acuity Right Eye Distance:   Left Eye Distance:   Bilateral Distance:    Right Eye Near:   Left Eye Near:    Bilateral Near:     Physical Exam Vitals and nursing note reviewed.  Constitutional:      General: She is not in acute distress.    Appearance: Normal appearance. She is not ill-appearing.  HENT:     Head: Normocephalic and atraumatic.     Right Ear: There is impacted cerumen.     Left Ear: Tympanic membrane and ear canal normal.     Nose: Nose normal.     Mouth/Throat:     Mouth: Mucous membranes are moist.     Pharynx: No oropharyngeal exudate or posterior oropharyngeal erythema.  Eyes:     Extraocular Movements: Extraocular movements intact.     Conjunctiva/sclera: Conjunctivae normal.     Pupils: Pupils are equal, round, and reactive to light.  Cardiovascular:     Rate and Rhythm: Normal rate and regular rhythm.     Heart sounds: Normal heart sounds.  Pulmonary:     Effort: Pulmonary effort is normal. No respiratory distress.     Breath sounds: Normal breath sounds. No stridor. No wheezing, rhonchi or rales.  Skin:    General: Skin is warm and dry.  Neurological:     General: No focal deficit present.     Mental Status: She is alert and oriented to person, place, and time.     GCS: GCS eye subscore is 4. GCS verbal subscore is 5. GCS motor subscore is 6.     Cranial Nerves: No facial asymmetry.     Motor: No weakness.     Coordination: Romberg sign positive.  Finger-Nose-Finger Test normal.  Psychiatric:        Behavior: Behavior normal.      UC Treatments / Results  Labs (all labs ordered are listed, but only abnormal results are displayed) Labs Reviewed  POCT URINALYSIS DIP (MANUAL ENTRY) - Abnormal; Notable for the following components:      Result Value   Spec Grav, UA <=1.005 (*)    Blood, UA small (*)    All other components within normal limits  POC COVID19/FLU A&B COMBO    EKG   Radiology No results found.  Procedures Procedures (including critical care time)  Medications Ordered in UC Medications - No data to display  Initial Impression / Assessment and Plan / UC Course  I have reviewed the triage vital signs and the nursing notes.  Pertinent labs & imaging results that were available during my care of the patient were reviewed by me and considered in my medical decision making (see chart for details).     Reviewed exam and symptoms with patient and daughter.  Urine with no signs of UTI.  Ear lavage tolerated well and TM within normal limits after irrigation.  No x-ray onsite at time of evaluation.  Lungs clear and O2 97% on room air.  Declined EKG.  After reviewing chart, I was unable to find the testing and/or treatment that the  daughter has been referring to as far as the steroid use for this potential underlying autoimmune condition.  Given her symptoms and age I did advise that they go to the ER for further workup.  Patient and daughter declined stating they will contact her PCP first thing tomorrow morning for further guidance.  They did request something for cough, Tessalon was sent to pharmacy.  Patient is hemodynamically stable with stable vital signs.  They will follow-up with her PCP tomorrow and I did review strict ER precautions and they verbalized understanding. Final Clinical Impressions(s) / UC Diagnoses   Final diagnoses:  Weakness  Impacted cerumen of right ear  Viral syndrome     Discharge  Instructions      You may take Tessalon 3 times a day as needed for your cough.  Please follow-up with your PCP tomorrow for further workup and treatment of your symptoms.  Please go to the ER if you develop any worsening symptoms.  Hope you feel better soon!     ED Prescriptions     Medication Sig Dispense Auth. Provider   benzonatate (TESSALON) 100 MG capsule Take 1 capsule (100 mg total) by mouth every 8 (eight) hours. 21 capsule Radford Pax, NP      PDMP not reviewed this encounter.   Radford Pax, NP 07/31/23 1519    Radford Pax, NP 07/31/23 1520

## 2023-07-31 NOTE — ED Triage Notes (Signed)
 Pt reports fatigue, weakness, no balance, sore throat, chills, cough x 1 week. Per daughter, pt bump into things when walking this week. Per daughter this happened before when iron level is low.

## 2023-12-22 ENCOUNTER — Other Ambulatory Visit: Payer: Self-pay | Admitting: Internal Medicine

## 2023-12-22 DIAGNOSIS — N6320 Unspecified lump in the left breast, unspecified quadrant: Secondary | ICD-10-CM

## 2024-01-06 ENCOUNTER — Ambulatory Visit
Admission: RE | Admit: 2024-01-06 | Discharge: 2024-01-06 | Disposition: A | Discharge status: Home / Self Care | Source: Ambulatory Visit | Attending: Internal Medicine | Admitting: Internal Medicine

## 2024-01-06 ENCOUNTER — Other Ambulatory Visit: Payer: Self-pay | Admitting: Internal Medicine

## 2024-01-06 DIAGNOSIS — N6320 Unspecified lump in the left breast, unspecified quadrant: Secondary | ICD-10-CM

## 2024-01-09 ENCOUNTER — Ambulatory Visit
Admission: RE | Admit: 2024-01-09 | Discharge: 2024-01-09 | Disposition: A | Discharge status: Home / Self Care | Source: Ambulatory Visit | Attending: Internal Medicine | Admitting: Internal Medicine

## 2024-01-09 DIAGNOSIS — N6320 Unspecified lump in the left breast, unspecified quadrant: Secondary | ICD-10-CM

## 2024-01-09 HISTORY — PX: BREAST BIOPSY: SHX20

## 2024-01-10 LAB — SURGICAL PATHOLOGY

## 2024-01-27 NOTE — Progress Notes (Signed)
 REFERRING PHYSICIAN:  Theo Suzen Lather* PROVIDER:  DONNICE CARLIN BURY, MD MRN: [id number] DOB: 1946-03-09 DATE OF ENCOUNTER: 01/27/2024 Subjective    Chief Complaint: New Consultation (Lt breast cancer)   History of Present Illness: 22 yof with palpable left breast mass.  MM shows a left breast mass that is 1.6 cm in size.  On US  this is 1.6x1.3x1.5 cm in size.  There are no abnormal nodes on US . 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 She is here to discuss her options    Had tia 36 years ago but no issues since.autoimmune disorder nos that she is on steroids taper for now.  Review of Systems: A complete review of systems was obtained from the patient.  I have reviewed this information and discussed as appropriate with the patient.  See HPI as well for other ROS.  Review of Systems  All other systems reviewed and are negative.    Medical History: Past Medical History:  Diagnosis Date  . Anemia   . Autoimmune disease (CMS/HHS-HCC)    non-specific; has multiple episodes of inflammation per year, mostly involving joints but also causes significant fatigue; improved with steroids  . Bone loss   . History of blood transfusion    During Covid  . Malignant neoplasm of lower-outer quadrant of left breast of female, estrogen receptor negative (CMS/HHS-HCC) 01/09/2024   01/09/2024: Left breast US -guided CNB of 5:00 mass (ribbon clip placed/verified) - invasive ductal carcinoma, grade 2, ER-, PR-, and HER2-    Patient Active Problem List  Diagnosis  . Malignant neoplasm of left breast in female, estrogen receptor negative (CMS/HHS-HCC)  . Genetic Test; Negative; Breast Cancer STAT Panel [facility] (01/2024)    Past Surgical History:  Procedure Laterality Date  . PERCUTANEOUS BIOPSY BREAST Left 01/09/2024   01/09/2024: Left breast US -guided CNB of 5:00 mass (ribbon clip placed/verified) - invasive ductal carcinoma, grade 2, ER-, PR-, and HER2-  . APPENDECTOMY     At the age of 20  .  OTHER SURGERY     bladder pexy for prolapse  . TONSILLECTOMY Bilateral      No Known Allergies  Current Outpatient Medications on File Prior to Visit  Medication Sig Dispense Refill  . ascorbic acid (VITAMIN C ORAL) Take by mouth    . b complex vitamins tablet Take 1 tablet by mouth once daily    . blue-green algae, Spirulina, 500 mg Tab Take by mouth 2 (two) times daily    . CYANOCOBALAMIN, VITAMIN B-12, SL Place 1,000 mg under the tongue    . FUSION PLUS 130 mg iron- 1,250 mcg Cap Take 1 capsule by mouth once daily    . glucosamine/methylsulfonylmeth (GLUCOSAMINE MSM ORAL) Take by mouth    . Herbal Supplement [place]    . omega-3s/dha/epa/fish oil (OMEGA 3 ORAL) Take by mouth    . OREGANO OIL ORAL Take by mouth    . VITAMIN D3 ORAL 5,000 Units     No current facility-administered medications on file prior to visit.    Family History  Problem Relation Age of Onset  . Bladder Cancer Mother   . High blood pressure (Hypertension) Father      Social History   Tobacco Use  Smoking Status Never  Smokeless Tobacco Never     Social History   Socioeconomic History  . Marital status: Married    Spouse name: Retail banker  . Number of children: 5  Occupational History  . Occupation: Retired  Tobacco Use  .  Smoking status: Never  . Smokeless tobacco: Never  Vaping Use  . Vaping status: Never Used  Substance and Sexual Activity  . Alcohol use: Never  . Drug use: Never  . Sexual activity: Not Currently    Partners: Male    Birth control/protection: Post-menopausal  Social History Narrative   *Last updated 01/19/2024   Preferred pronouns: She/Her/Hers   Married, 5 living kids   Education: Education administrator   Occupation: Retired Education officer, museum (Language Arts)   Religious preference: [employer] day [employer]ist   Exercise: None routine   Diet: Vegetarian   Social Drivers of Health   Housing Stability: Unknown (01/19/2024)   Housing Stability Vital Sign   . Homeless in  the Last Year: No    Objective:   Vitals:   01/27/24 1044  BP: 137/83  Pulse: 78  Temp: 36.4 C (97.6 F)  Weight: 75.1 kg (165 lb 9.6 oz)  Height: 167.6 cm (5' 6)  PainSc: 0-No pain    Body mass index is 26.73 kg/m.  Physical Exam Constitutional:      Appearance: Normal appearance.  Chest:  Breasts:    Right: No inverted nipple, mass or nipple discharge.     Left: Mass present. No inverted nipple or nipple discharge.  Lymphadenopathy:     Upper Body:     Right upper body: No supraclavicular adenopathy.     Left upper body: No supraclavicular adenopathy.  Neurological:     Mental Status: She is alert.       Assessment and Plan:     Diagnoses and all orders for this visit:  Malignant neoplasm of lower-outer quadrant of left breast of female, estrogen receptor negative (CMS/HHS-HCC)    Left breast seed guided lumpectomy, left ax sn biopsy Med onc and rad once referrals Will decide whether to have care at home vs [facility]  We discussed the staging and pathophysiology of breast cancer. We discussed all of the different options for treatment for breast cancer including surgery, chemotherapy, radiation therapy, and antiestrogen therapy.  We discussed a sentinel lymph node biopsy as she does not appear to having lymph node involvement right now. We discussed the performance of that with injection of Magtrace with small risk of discoloration.  We discussed that there is a chance of having a positive node with a sentinel lymph node biopsy and we will await the permanent pathology to make any other first further decisions in terms of her treatment. We discussed up to a 5% risk lifetime of chronic shoulder pain as well as lymphedema associated with a sentinel lymph node biopsy. Discussed sozo testing preop and following her afterwards.   We discussed the options for treatment of the breast cancer which included lumpectomy versus a mastectomy. We discussed the performance of the  lumpectomy with radioactive seed placement. We discussed a 5-10% chance of a positive margin requiring reexcision in the operating room. We also discussed that she will be recommended radiation therapy if she undergoes lumpectomy.  We discussed mastectomy and the postoperative care for that as well. Mastectomy can be followed by reconstruction. The decision for lumpectomy vs mastectomy has no impact on decision for chemotherapy. She will be recommended some form of  systemic therapy postop. Most mastectomy patients will not need radiation therapy. We discussed that there is no difference in her survival whether she undergoes lumpectomy with radiation therapy or antiestrogen therapy versus a mastectomy. There is also no real difference between her recurrence in the breast.  We discussed the  risks of operation including bleeding, infection, possible reoperation. She understands her further therapy will be based on what her stages at the time of her operation.     MATTHEW CARLIN BURY, MD

## 2024-02-01 ENCOUNTER — Inpatient Hospital Stay

## 2024-02-01 ENCOUNTER — Inpatient Hospital Stay: Attending: Hematology and Oncology | Admitting: Hematology and Oncology

## 2024-02-01 VITALS — BP 130/82 | HR 66 | Temp 97.2°F | Resp 17 | Wt 157.8 lb

## 2024-02-01 DIAGNOSIS — Z171 Estrogen receptor negative status [ER-]: Secondary | ICD-10-CM | POA: Insufficient documentation

## 2024-02-01 DIAGNOSIS — D72819 Decreased white blood cell count, unspecified: Secondary | ICD-10-CM | POA: Insufficient documentation

## 2024-02-01 DIAGNOSIS — M359 Systemic involvement of connective tissue, unspecified: Secondary | ICD-10-CM | POA: Diagnosis not present

## 2024-02-01 DIAGNOSIS — C50512 Malignant neoplasm of lower-outer quadrant of left female breast: Secondary | ICD-10-CM | POA: Insufficient documentation

## 2024-02-01 NOTE — Progress Notes (Signed)
 [facility] CONSULT NOTE  Patient Care Team: Patient, No Pcp Per as PCP - General (General Practice) Rutherford Gain, MD as Consulting Physician (Obstetrics and Gynecology) Theo Iha, MD (Internal Medicine)  CHIEF COMPLAINTS/PURPOSE OF CONSULTATION:  Newly diagnosed triple negative breast cancer  HISTORY OF PRESENTING ILLNESS:    History of Present Illness [name] is a 78 year old female with invasive ductal carcinoma who presents for follow-up regarding her breast cancer diagnosis. She is accompanied by her daughter. She was referred by Dr. [name] for further evaluation of her breast cancer.  She has experienced a lump in her breast for about a month, leading to mammograms, ultrasound, and a biopsy. Her family history includes a first cousin who died from breast cancer and a mother who died of bladder cancer. Genetic testing is underway, with no pathogenic variants identified so far.  Her past medical history includes episodes of inflammation with elevated sedimentation rates, causing extreme fatigue, dizziness, and lightheadedness. These episodes occur a couple of times a year and can last up to two months. She also has a low white blood cell count.  She is currently on prednisone  for inflammation, resulting in weight gain. There was unexplained weight loss prior to starting prednisone . No chronic illnesses such as heart disease, lung disease, kidney issues, or liver issues are present.     I reviewed her records extensively and collaborated the history with the patient.  SUMMARY OF ONCOLOGIC HISTORY: Oncology History  Malignant neoplasm of lower-outer quadrant of left breast of female, estrogen receptor negative (HCC)  01/09/2024 Initial Diagnosis   Palpable left breast mass: 1.6 cm 5 o'clock position, axilla negative, biopsy: Grade 2 IDC ER 0%, PR 0%, Ki67 30%, HER2 0      MEDICAL HISTORY:  Past Medical History:  Diagnosis Date   Abnormal  laboratory test 03/02/2018   Anemia    Arthritis    bilateral knees   Chest pain    Complication of anesthesia    see note about TIA-32 yrs ago   Concussion 05/11/2013   Dizziness    Head pain 07/25/15   recent blow to head   History of concussion    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   Pericarditis 04/2015   TIA (transient ischemic attack) 1988   occurred three days after anesthesia   Vertigo 08/07/2013    SURGICAL HISTORY: Past Surgical History:  Procedure Laterality Date   APPENDECTOMY     ARTERY BIOPSY Left 03/09/2018   Procedure: BIOPSY LEFT TEMPORAL ARTERY;  Surgeon: Kimble Agent, MD;  Location: [facility];  Service: General;  Laterality: Left;  MAC   BREAST BIOPSY Left 01/09/2024   US  LT BREAST BX W LOC DEV 1ST LESION IMG BX SPEC US  GUIDE 01/09/2024 [facility] MAMMOGRAPHY   CERVICAL CONE BIOPSY     COLONOSCOPY     CYSTOCELE REPAIR     DILATATION & CURETTAGE/HYSTEROSCOPY WITH MYOSURE N/A 08/08/2015   Procedure: DILATATION & CURETTAGE/HYSTEROSCOPY WITH MYOSURE;  Surgeon: Gain Rutherford, MD;  Location: [facility];  Service: Gynecology;  Laterality: N/A;   TONSILLECTOMY     TUBAL LIGATION      SOCIAL HISTORY: Social History   Socioeconomic History   Marital status: Married    Spouse name: Therapist, occupational   Number of children: 6   Years of education: 19   Highest education level: Master's degree (e.g., MA, MS, MEng, MEd, MSW, MBA)  Occupational History   Occupation: Retired  Tobacco Use   Smoking status: Never  Smokeless tobacco: Never  Vaping Use   Vaping status: Never Used  Substance and Sexual Activity   Alcohol use: No   Drug use: No   Sexual activity: Not Currently    Birth control/protection: Post-menopausal  Other Topics Concern   Not on file  Social History Narrative   Patient is married Conservation officer, historic buildings) and lives at home with her husband and her daughter.   Patient has five living children and one is deceased.   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    Patient is right handed.   Uses very little caffeine.   Social Drivers of Corporate investment banker Strain: Not on file  Food Insecurity: Not on file  Transportation Needs: Not on file  Physical Activity: Not on file  Stress: Not on file  Social Connections: Not on file  Intimate Partner Violence: Not on file    FAMILY HISTORY: Family History  Problem Relation Age of Onset   Cancer Mother    Heart disease Father    Heart failure Father    Coronary artery disease Other     ALLERGIES:  has no known allergies.  MEDICATIONS:  Current Outpatient Medications  Medication Sig Dispense Refill   acetaminophen  (TYLENOL ) 500 MG tablet Take 1,000 mg by mouth every 6 (six) hours as needed for moderate pain.     Ascorbic Acid (VITAMIN C) 1000 MG tablet Take 1,000 mg by mouth daily.     b complex vitamins tablet Take 1 tablet by mouth daily.     Cholecalciferol (VITAMIN D3) 50 MCG (2000 UT) capsule Vitamin D3     Ginkgo Biloba (GINKOBA PO) Take 1 tablet by mouth daily.     Glucosamine HCl (GLUCOSAMINE PO) Take 3 capsules by mouth daily.     Iron-Vitamin C (IRON 100/C) 100-250 MG TABS Take 1 capsule by mouth daily.      Polyvinyl Alcohol-Povidone (REFRESH OP) Apply 1 drop to eye daily as needed (dry eyes).     Probiotic Product (PROBIOTIC & ACIDOPHILUS EX ST PO) Take 1 tablet by mouth daily.      Protein POWD Take 1 scoop by mouth daily. Mix with water     Specialty Vitamins Products (ONE-A-DAY BONE STRENGTH PO) Take 3 tablets by mouth daily.     COCONUT OIL-FLAXSEED OIL PO Take 15 mLs by mouth daily. (Patient not taking: Reported on 02/01/2024)     MAGNESIUM GLYCINATE PLUS PO Take 400 mg by mouth daily.  (Patient not taking: Reported on 02/01/2024)     No current facility-administered medications for this visit.    REVIEW OF SYSTEMS:   Constitutional: Denies fevers, chills or abnormal night sweats All other systems were reviewed with the patient and are negative.  PHYSICAL  EXAMINATION: ECOG PERFORMANCE STATUS: 1 - Symptomatic but completely ambulatory  Vitals:   02/01/24 1217  BP: 130/82  Pulse: 66  Resp: 17  Temp: (!) 97.2 F (36.2 C)  SpO2: 100%   Filed Weights   02/01/24 1217  Weight: 157 lb 12.8 oz (71.6 kg)    GENERAL:alert, no distress and comfortable  LABORATORY DATA:  I have reviewed the data as listed Lab Results  Component Value Date   WBC 2.3 (L) 04/27/2021   HGB 12.0 04/27/2021   HCT 39.6 04/27/2021   MCV 82.9 04/27/2021   PLT 235 04/27/2021   Lab Results  Component Value Date   NA 141 04/27/2021   K 3.7 04/27/2021   CL 106 04/27/2021  CO2 24 04/27/2021    RADIOGRAPHIC STUDIES: I have personally reviewed the radiological reports and agreed with the findings in the report.  ASSESSMENT AND PLAN:  Malignant neoplasm of lower-outer quadrant of left breast of female, estrogen receptor negative (HCC) 01/09/24: Palpable left breast mass: 1.6 cm 5 o'clock position, axilla negative, biopsy: Grade 2 IDC ER 0%, PR 0%, Ki67 30%, HER2 0  Pathology and radiology counseling: Discussed with the patient, the details of pathology including the type of breast cancer,the clinical staging, the significance of ER, PR and HER-2/neu receptors and the implications for treatment. After reviewing the pathology in detail, we proceeded to discuss the different treatment options between surgery, radiation, chemotherapy, antiestrogen therapies.  Treatment plan: Breast conserving surgery with sentinel lymph node biopsy Adjuvant radiation therapy Discussed the pros and cons of adjuvant chemotherapy.  Given her overall excellent health and good performance status I recommend adjuvant systemic chemotherapy with CMF x 6 cycles.  I also briefly discussed Taxotere Cytoxan x 4 but because of her overall age consideration I did not feel like she could tolerate it.  Family and patient are in agreement tentatively.  They will discuss this further and confirm the  decision.  She will need port placement if she decides to go through chemotherapy.   Assessment & Plan Invasive ductal carcinoma of left breast, triple negative, stage 1B Tumor 1.6 cm on imaging, 1.1 cm on biopsy. No lymph node enlargement. ER, PR, HER2 negative. KI-67 at 30%, grade 2. Surgery necessary. Chemotherapy debated due to age and health. Radiation recommended post-surgery. CMF and TC regimens considered. Recurrence risk 25% over ten years. Lymphedema risk 5%. - Schedule lumpectomy with sentinel lymph node biopsy. - Discuss chemotherapy options with family and decide on CMF regimen if chemotherapy is chosen. - Arrange for port placement during surgery if chemotherapy is chosen. - Order CT scan to assess for metastasis due to unexplained weight loss. - Schedule radiation therapy post-surgery if chemotherapy is not chosen.  Autoimmune disease with elevated ESR and episodic fatigue Autoimmune disease with elevated ESR causing episodic fatigue and dizziness. Potential improvement with chemotherapy due to immunosuppressive effects.  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 Consideration for booster shots during chemotherapy if needed. - Monitor white blood cell counts during chemotherapy. - Administer booster shots for white blood cells if necessary.  Follow-Up Follow-up plans discussed for post-surgery and chemotherapy management. - Schedule follow-up appointment after surgery. - Coordinate CT scan within a week.     All questions were answered. The patient knows to call the clinic with any problems, questions or concerns.    Viinay K Corinthian Kemler, MD 02/01/24

## 2024-02-01 NOTE — Assessment & Plan Note (Signed)
 01/09/24: Palpable left breast mass: 1.6 cm 5 o'clock position, axilla negative, biopsy: Grade 2 IDC ER 0%, PR 0%, Ki67 30%, HER2 0  Pathology and radiology counseling: Discussed with the patient, the details of pathology including the type of breast cancer,the clinical staging, the significance of ER, PR and HER-2/neu receptors and the implications for treatment. After reviewing the pathology in detail, we proceeded to discuss the different treatment options between surgery, radiation, chemotherapy, antiestrogen therapies.  Treatment plan: Breast conserving surgery with sentinel lymph node biopsy Adjuvant radiation therapy Discussed the pros and cons of adjuvant chemotherapy.

## 2024-02-02 ENCOUNTER — Other Ambulatory Visit: Payer: Self-pay | Admitting: General Surgery

## 2024-02-02 ENCOUNTER — Encounter: Payer: Self-pay | Admitting: *Deleted

## 2024-02-02 DIAGNOSIS — C50512 Malignant neoplasm of lower-outer quadrant of left female breast: Secondary | ICD-10-CM

## 2024-02-03 ENCOUNTER — Inpatient Hospital Stay

## 2024-02-03 ENCOUNTER — Ambulatory Visit (HOSPITAL_COMMUNITY)
Admission: RE | Admit: 2024-02-03 | Discharge: 2024-02-03 | Disposition: A | Discharge status: Home / Self Care | Source: Ambulatory Visit | Attending: Hematology and Oncology | Admitting: Hematology and Oncology

## 2024-02-03 ENCOUNTER — Other Ambulatory Visit: Payer: Self-pay | Admitting: *Deleted

## 2024-02-03 DIAGNOSIS — C50512 Malignant neoplasm of lower-outer quadrant of left female breast: Secondary | ICD-10-CM

## 2024-02-03 DIAGNOSIS — Z171 Estrogen receptor negative status [ER-]: Secondary | ICD-10-CM | POA: Insufficient documentation

## 2024-02-03 LAB — CBC WITH DIFFERENTIAL (CANCER CENTER ONLY)
Abs Immature Granulocytes: 0.03 K/uL (ref 0.00–0.07)
Basophils Absolute: 0 K/uL (ref 0.0–0.1)
Basophils Relative: 0 %
Eosinophils Absolute: 0 K/uL (ref 0.0–0.5)
Eosinophils Relative: 0 %
HCT: 43.2 % (ref 36.0–46.0)
Hemoglobin: 13.2 g/dL (ref 12.0–15.0)
Immature Granulocytes: 1 %
Lymphocytes Relative: 23 %
Lymphs Abs: 1.1 K/uL (ref 0.7–4.0)
MCH: 25.7 pg — ABNORMAL LOW (ref 26.0–34.0)
MCHC: 30.6 g/dL (ref 30.0–36.0)
MCV: 84.2 fL (ref 80.0–100.0)
Monocytes Absolute: 0.9 K/uL (ref 0.1–1.0)
Monocytes Relative: 18 %
Neutro Abs: 2.8 K/uL (ref 1.7–7.7)
Neutrophils Relative %: 58 %
Platelet Count: 333 K/uL (ref 150–400)
RBC: 5.13 MIL/uL — ABNORMAL HIGH (ref 3.87–5.11)
RDW: 16.4 % — ABNORMAL HIGH (ref 11.5–15.5)
WBC Count: 4.8 K/uL (ref 4.0–10.5)
nRBC: 0 % (ref 0.0–0.2)

## 2024-02-03 LAB — COMPREHENSIVE METABOLIC PANEL WITH GFR
ALT: 16 U/L (ref 0–44)
AST: 15 U/L (ref 15–41)
Albumin: 4.4 g/dL (ref 3.5–5.0)
Alkaline Phosphatase: 82 U/L (ref 38–126)
Anion gap: 5 (ref 5–15)
BUN: 22 mg/dL (ref 8–23)
CO2: 29 mmol/L (ref 22–32)
Calcium: 10.4 mg/dL — ABNORMAL HIGH (ref 8.9–10.3)
Chloride: 104 mmol/L (ref 98–111)
Creatinine, Ser: 0.72 mg/dL (ref 0.44–1.00)
GFR, Estimated: >60 mL/min (ref >60)
Glucose, Bld: 89 mg/dL (ref 70–99)
Potassium: 4.9 mmol/L (ref 3.5–5.1)
Sodium: 138 mmol/L (ref 135–145)
Total Bilirubin: 0.5 mg/dL (ref 0.0–1.2)
Total Protein: 7.9 g/dL (ref 6.5–8.1)

## 2024-02-03 MED ORDER — IOHEXOL 300 MG/ML  SOLN
100.0000 mL | Freq: Once | INTRAMUSCULAR | Status: AC | PRN
  Administered 2024-02-03: 80 mL via INTRAVENOUS

## 2024-02-03 NOTE — Progress Notes (Signed)
 Location of Breast Cancer:  Malignant neoplasm of lower-outer quadrant of left breast of female, estrogen receptor negative (HCC)   Histology per Pathology Report:    Receptor Status: ER(0%), PR (0%), Her2-neu (0%), Ki-67(30%)  Did patient present with symptoms (if so, please note symptoms) or was this found on screening mammography?:  Patient found the lump in the left breast.  Past/Anticipated interventions by surgeon, if any: [name], MD Malignant Neoplasm of Lower Outer Quadrant of Left Breast, Estrogen Receptor Negative   Past/Anticipated interventions by medical oncology, if any:  [name], MD    Lymphedema issues, if any:      Pain issues, if any:   None  SAFETY ISSUES: Prior radiation? None Pacemaker/ICD? None Possible current pregnancy?None Is the patient on methotrexate? None  Current Complaints / other details:   None

## 2024-02-07 ENCOUNTER — Other Ambulatory Visit: Payer: Self-pay | Admitting: General Surgery

## 2024-02-07 DIAGNOSIS — Z171 Estrogen receptor negative status [ER-]: Secondary | ICD-10-CM

## 2024-02-07 NOTE — Progress Notes (Signed)
 Radiation Oncology         [phone]) [phone] ________________________________  Initial outpatient Consultation  Name: [name] MRN: [id number]  Date: 02/08/2024  DOB: April 21, 1946  RR:Dyzounw, Suzen, MD  [name] Cough, MD   REFERRING PHYSICIAN: [name] Cough, MD  DIAGNOSIS:    ICD-10-CM   1. 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 female who presented with breast abnormality on the following imaging: bilateral diagnostic mammogram on the date of 01/06/24. Symptoms, if any, at that time, were a palpable area of concern in the left breast with associated tenderness. Ultrasound of breast revealed a palpable 1.6 mass in the 5 o'clock position of the left breast that is suspicious for malignancy.  Multiple morphologically benign lymph nodes were appreciated in the left axilla, no lymphadenopathy was identified. No abnormalities in right breast were identified.   In light of findings, she underwent a fine needle core biopsy on the date of 01/09/24. 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 ER status: negative; PR status negative, Her2 status negative; Grade 2. Ki-67: 30%.  She underwent a repeat bilateral mammogram on 01/10/24 ([facility]) after feeling a new lump in her left breast. Mammogram showed a new 1.7 cm, round, complex cystic and  solid mass with microlobulated margins with posterior enhancement seen in the left breast at 5 o'clock, 9 cm from the nipple. The mass correlates with the mass seen on the mammogram. Scan also noted a a morphologically normal-appearing left axillary lymph node.   In light of findings, she was referred to Dr. Natarajan on 01/20/24 to discuss possible treatment plan. Multiple approaches were discussed including surgical interventions, radiation and chemotherapy with carbo/taxol per PATTERN v. IV CMF. However, treatment plan is contingent on final pathology.   She saw Dr. [name] for consultation on 01/27/24. Per Dr. Gelene recommendations, patient decided to proceed with a lumpectomy and left axillary sentinel lymph node biopsy which has been scheduled for 02/21/2024.   She presented to radiation oncologist, Dr. Devere Nicolas, at [facility] on 02/07/2024. At that time she recommended adjuvant radiotherapy to significantly reduce her risk for local recurrence particularly in the setting of triple negative disease biology   At her most recent consultation with Dr. Odean on 02/01/24, they discussed recommendations of adjuvant systemic chemotherapy with CMF x 6 cycles.  Patient is still undecided on whether or not she would like to proceed with this recommendation.  Family history includes a first cousin who passed away from breast cancer and a mother who passed away of bladder cancer. Genetic testing is underway, with no pathogenic variants identified so far.   Of note: she has experienced significant weight loss, approximately 20 pounds over the past six months. However, she is currently on prednisone  for chronic elevated ESR, resulting in weight gain.    Patient reports to be doing well overall today.  She notes a palpable breast mass that she is sometimes able to feel.  She denies any breast pain.    PREVIOUS RADIATION THERAPY: No  PAST MEDICAL HISTORY:  has a past medical history of Abnormal  laboratory test (03/02/2018), Anemia, Arthritis, Chest pain, Complication of anesthesia, Concussion (05/11/2013), Dizziness, Head pain (07/25/15), History of concussion, Memory loss (03/04/2016), Pericarditis (04/2015), TIA (transient ischemic attack) (1988), and Vertigo (08/07/2013).    PAST SURGICAL HISTORY: Past Surgical History:  Procedure Laterality Date   APPENDECTOMY     ARTERY BIOPSY Left 03/09/2018   Procedure: BIOPSY LEFT TEMPORAL ARTERY;  Surgeon: Kimble Agent, MD;  Location: [facility];  Service: General;  Laterality: Left;  MAC   BREAST BIOPSY Left 01/09/2024   US  LT BREAST BX W LOC DEV 1ST LESION IMG BX SPEC US  GUIDE 01/09/2024 [facility] MAMMOGRAPHY   CERVICAL CONE BIOPSY     COLONOSCOPY     CYSTOCELE REPAIR     DILATATION & CURETTAGE/HYSTEROSCOPY WITH MYOSURE N/A 08/08/2015   Procedure: DILATATION & CURETTAGE/HYSTEROSCOPY WITH MYOSURE;  Surgeon: Dickie Carder, MD;  Location: [facility];  Service: Gynecology;  Laterality: N/A;   TONSILLECTOMY     TUBAL LIGATION      FAMILY HISTORY: family history includes Cancer in her mother; Coronary artery disease in an other family member; Heart disease in her father; Heart failure in her father.  SOCIAL HISTORY:  reports that she has never smoked. She has never used smokeless tobacco. She reports that she does not drink alcohol and does not use drugs.  ALLERGIES: Patient has no known allergies.  MEDICATIONS:  Current Outpatient Medications  Medication Sig Dispense Refill   acetaminophen  (TYLENOL ) 500 MG tablet Take 1,000 mg by mouth every 6 (six) hours as needed for moderate pain.     Ascorbic Acid (VITAMIN C) 1000 MG tablet Take 1,000 mg by mouth daily.     b complex vitamins tablet Take 1 tablet by mouth daily.     Cholecalciferol (VITAMIN D3) 50 MCG (2000 UT) capsule Vitamin D3     Ginkgo Biloba (GINKOBA PO) Take 1 tablet by mouth daily.     Glucosamine HCl (GLUCOSAMINE PO) Take 3 capsules by mouth daily.     Iron-Vitamin C  (IRON 100/C) 100-250 MG TABS Take 1 capsule by mouth daily.      MAGNESIUM GLYCINATE PLUS PO Take 400 mg by mouth daily.      Polyvinyl Alcohol-Povidone (REFRESH OP) Apply 1 drop to eye daily as needed (dry eyes).     Probiotic Product (PROBIOTIC & ACIDOPHILUS EX ST PO) Take 1 tablet by mouth daily.      Protein POWD Take 1 scoop by mouth daily. Mix with water     Specialty Vitamins Products (ONE-A-DAY BONE STRENGTH PO) Take 3 tablets by mouth daily.     COCONUT OIL-FLAXSEED OIL PO Take 15 mLs by mouth daily. (Patient not taking: Reported on 02/08/2024)     No current facility-administered medications for this encounter.    REVIEW OF SYSTEMS: As above in HPI.   PHYSICAL EXAM:  height is 5' 6 (1.676 m) and weight is 159 lb 12.8 oz (72.5 kg). Her temperature is 97.4 F (36.3 C) (abnormal). Her blood pressure is 135/67 and her pulse is 67. Her respiration is 18 and oxygen saturation is 99%.   General: Alert and oriented, in no acute distress HEENT: Head is normocephalic. Extraocular movements are intact.  Heart: Regular in rate and rhythm with no murmurs, rubs, or gallops.  Chest: Clear to auscultation bilaterally, with no rhonchi, wheezes, or rales. Abdomen: Soft, nontender, nondistended, with no rigidity or guarding. Extremities: No cyanosis or edema. Skin: No concerning lesions. Musculoskeletal: symmetric strength and muscle tone throughout. Neurologic: Cranial nerves II through XII are grossly intact. No obvious focalities. Speech is fluent. Coordination is intact. Psychiatric: Judgment and insight are intact. Affect is appropriate.  Breasts: Mobile, palpable mass in the LOQ of the left breast approximately 1 cm with associated tenderness to palpation. No other palpable masses or abnormalities appreciated in the breasts or axillae.    ECOG = 0  0 - Asymptomatic (Fully active, able to carry on all predisease activities without restriction)  1 - Symptomatic but completely ambulatory  (Restricted in physically strenuous activity but ambulatory and able to carry out work of a light or sedentary nature. For example, light housework, office work)  2 - Symptomatic, <50% in bed during the day (Ambulatory and capable of all self care but unable to carry out any work activities. Up and about more than 50% of waking hours)  3 - Symptomatic, >50% in bed, but not bedbound (Capable of only limited self-care, confined to bed or chair 50% or more of waking hours)  4 - Bedbound (Completely disabled. Cannot carry on any self-care. Totally confined to bed or chair)  5 - Death   Raylene MM, Creech RH, Tormey DC, et al. [phone]). Toxicity and response criteria of the [facility]. Am. DOROTHA Bridges. Oncol. 5 (6): 649-55   LABORATORY DATA:   CBC    Component Value Date/Time   WBC 4.8 02/03/2024 1206   WBC 3.4 (L) 02/18/2019 1805   RBC 5.13 (H) 02/03/2024 1206   HGB 13.2 02/03/2024 1206   HGB 12.6 03/02/2018 1613   HGB 11.2 (L) 11/12/2010 1126   HCT 43.2 02/03/2024 1206   HCT 39.6 04/27/2021 1427   HCT 35.5 11/12/2010 1126   PLT 333 02/03/2024 1206   PLT 358 03/02/2018 1613   MCV 84.2 02/03/2024 1206   MCV 82 03/02/2018 1613   MCV 82 11/12/2010 1126   MCH 25.7 (L) 02/03/2024 1206   MCHC 30.6 02/03/2024 1206   RDW 16.4 (H) 02/03/2024 1206   RDW 15.3 03/02/2018 1613   RDW 16.2 (H) 11/12/2010 1126   LYMPHSABS 1.1 02/03/2024 1206   LYMPHSABS 1.2 03/02/2018 1613   LYMPHSABS 1.5 11/12/2010 1126   MONOABS 0.9 02/03/2024 1206   EOSABS 0.0 02/03/2024 1206   EOSABS 0.0 03/02/2018 1613   EOSABS 0.0 11/12/2010 1126   BASOSABS 0.0 02/03/2024 1206   BASOSABS 0.0 03/02/2018 1613   BASOSABS 0.1 11/12/2010 1126    CMP     Component Value Date/Time   NA 138 02/03/2024 1206   K 4.9 02/03/2024 1206   CL 104 02/03/2024 1206   CO2 29 02/03/2024 1206   GLUCOSE 89 02/03/2024 1206   BUN 22 02/03/2024 1206   CREATININE 0.72 02/03/2024 1206   CREATININE 0.75 04/27/2021  1420   CALCIUM 10.4 (H) 02/03/2024 1206   PROT 7.9 02/03/2024 1206   ALBUMIN 4.4 02/03/2024 1206   AST 15 02/03/2024 1206   AST 19 04/27/2021 1420   ALT 16 02/03/2024 1206   ALT 14 04/27/2021 1420   ALKPHOS 82 02/03/2024 1206   BILITOT 0.5 02/03/2024 1206   BILITOT 0.5 04/27/2021 1420   GFRNONAA >60 02/03/2024 1206   GFRNONAA >60 04/27/2021 1420      RADIOGRAPHY:  as abpve     IMPRESSION/PLAN: Stage IB (  cT1c, N0, M0) 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  We personally reviewed her current workup. Her lumpectomy and SLN biopsy is scheduled for 02/21/2024 with Dr. [name]. Patient is still considering whether to proceed with the recommended adjuvant chemotherapy.  We discussed the risks, benefits, and side effects of radiotherapy. Given her triple negative disease, Dr. Izell recommends radiotherapy to the left breast to reduce her risk of locoregional recurrence.  We discussed that radiation would take approximately 4-6 weeks to complete (depending on nodal status post operatively) and that we would give the patient a few weeks to heal following surgery before starting treatment planning.  If patient decides to proceed with chemotherapy, this would precede radiotherapy. We spoke about acute effects including skin irritation and fatigue as well as much less common late effects including internal organ injury or irritation. We spoke about the latest technology that is used to minimize the risk of late effects for patients undergoing radiotherapy to the breast or chest wall. No guarantees of treatment were given. The patient is enthusiastic about proceeding with treatment. We look forward to participating in the patient's care.  We will await her referral back to me for postoperative follow-up and eventual CT simulation/treatment planning.  On date of service, in total, we spent 60 minutes on this encounter.  Patient was seen in person. Note signed after encounter date; minutes pertain to date of service, only.    __________________________________________    Leeroy Due, PA-C   Lauraine Izell, MD    [facility]  Radiation Oncology Direct Dial: [phone]  Fax: [phone] [email or web address]    This document serves as a record of services personally performed by Lauraine Izell, MD. It was created on her behalf by Reymundo Cartwright, a trained medical scribe. The creation of this record is based on the scribe's personal observations and the provider's statements to them. This document has been checked and approved by the attending provider.

## 2024-02-08 ENCOUNTER — Ambulatory Visit
Admission: RE | Admit: 2024-02-08 | Discharge: 2024-02-08 | Disposition: A | Discharge status: Home / Self Care | Source: Ambulatory Visit | Attending: Radiation Oncology | Admitting: Radiation Oncology

## 2024-02-08 ENCOUNTER — Encounter: Payer: Self-pay | Admitting: Radiation Oncology

## 2024-02-08 VITALS — BP 135/67 | HR 67 | Temp 97.4°F | Resp 18 | Ht 66.0 in | Wt 159.8 lb

## 2024-02-08 DIAGNOSIS — Z171 Estrogen receptor negative status [ER-]: Secondary | ICD-10-CM

## 2024-02-10 ENCOUNTER — Telehealth: Payer: Self-pay | Admitting: *Deleted

## 2024-02-10 ENCOUNTER — Encounter: Payer: Self-pay | Admitting: *Deleted

## 2024-02-10 NOTE — Telephone Encounter (Signed)
 Spoke with patient to follow up on her decision on whether she will get tx here or at [facility].  Patient states she will get tx here and she is scheduled for sx 9/16.  Informed her she would get a call from our office to schedule a follow up appt with Dr. Odean after sx.  Patient verbalized understanding.

## 2024-02-14 ENCOUNTER — Other Ambulatory Visit: Payer: Self-pay

## 2024-02-14 ENCOUNTER — Encounter (HOSPITAL_BASED_OUTPATIENT_CLINIC_OR_DEPARTMENT_OTHER): Payer: Self-pay | Admitting: General Surgery

## 2024-02-15 ENCOUNTER — Encounter (HOSPITAL_BASED_OUTPATIENT_CLINIC_OR_DEPARTMENT_OTHER): Payer: Self-pay | Admitting: General Surgery

## 2024-02-15 ENCOUNTER — Inpatient Hospital Stay: Attending: Hematology and Oncology | Admitting: Licensed Clinical Social Worker

## 2024-02-15 NOTE — Progress Notes (Signed)
 [facility] Clinical Social Work  Initial Assessment   [name] is a 78 y.o. year old female contacted by phone. Clinical Social Work was referred by new patient protocol for assessment of psychosocial needs.   SDOH (Social Determinants of Health) assessments performed: Yes- reviewed from 8/27   SDOH Screenings   Food Insecurity: No Food Insecurity (02/01/2024)  Housing: Unknown (02/01/2024)  Transportation Needs: No Transportation Needs (02/01/2024)  Utilities: Not At Risk (02/01/2024)  Depression (PHQ2-9): Low Risk  (02/01/2024)  Tobacco Use: Low Risk  (02/14/2024)    PHQ 2/9:    02/01/2024   12:28 PM  Depression screen PHQ 2/9  Decreased Interest 0  Down, Depressed, Hopeless 0  PHQ - 2 Score 0     Distress Screen completed: No     No data to display            Family/Social Information:  Housing Arrangement: patient lives with her husband and daughter. Her husband has vascular dementia Family members/support persons in your life? Family including 5 kids (4 in [place]) and 12 grandkids, Friends, and [employer] Financial concerns: no  Employment: Retired.  Income source: Actor concerns: No Type of concern: None Food access concerns: no Religious or spiritual practice: Yes-strong faith which helps her through difficult times including now Advanced directives: No Services Currently in place:  [employer];  daughter working on getting FMLA paperwork  Coping/ Adjustment to diagnosis: Patient understands treatment plan and what happens next? yes Patient reported stressors: Adjusting to my illness Patient enjoys time with family/ friends Current coping skills/ strengths: Ability for insight , Manufacturing systems engineer , Motivation for treatment/growth , Religious Affiliation , and Supportive family/friends     SUMMARY: Current SDOH Barriers:  No major barriers identified today  Clinical Social Work Clinical Goal(s):  No clinical social work  goals at this time  Interventions: Discussed common feeling and emotions when being diagnosed with cancer, and the importance of support during treatment Informed patient of the support team roles and support services at [facility] Provided CSW contact information and encouraged patient to call with any questions or concerns Provided patient with information about contacting surgeon's office for help with FMLA paperwork for daughter to help pt post-surgery    Follow Up Plan: Patient will contact CSW with any support or resource needs Patient verbalizes understanding of plan: Yes    [name] E Joan Risse, LCSW Clinical Social Worker [facility]

## 2024-02-16 ENCOUNTER — Other Ambulatory Visit: Payer: Self-pay | Admitting: General Surgery

## 2024-02-16 DIAGNOSIS — Z171 Estrogen receptor negative status [ER-]: Secondary | ICD-10-CM

## 2024-02-17 ENCOUNTER — Ambulatory Visit
Admission: RE | Admit: 2024-02-17 | Discharge: 2024-02-17 | Disposition: A | Discharge status: Home / Self Care | Source: Ambulatory Visit | Attending: General Surgery | Admitting: General Surgery

## 2024-02-17 DIAGNOSIS — Z171 Estrogen receptor negative status [ER-]: Secondary | ICD-10-CM

## 2024-02-17 HISTORY — PX: BREAST BIOPSY: SHX20

## 2024-02-17 NOTE — Progress Notes (Signed)
      Enhanced Recovery after Surgery  Enhanced Recovery after Surgery is a protocol used to improve the stress on your body and your recovery after surgery.  Patient Instructions  The night before surgery:  No food after midnight. ONLY clear liquids after midnight  The day of surgery (if you do NOT have diabetes):  Drink ONE (1) Pre-Surgery Clear Ensure as directed.   This drink was given to you during your hospital  pre-op appointment visit. The pre-op nurse will instruct you on the time to drink the  Pre-Surgery Ensure depending on your surgery time. Finish the drink at the designated time by the pre-op nurse.  Nothing else to drink after completing the  Pre-Surgery Clear Ensure.  The day of surgery (if you have diabetes): Drink ONE (1) Gatorade 2 (G2) as directed. This drink was given to you during your hospital  pre-op appointment visit.  The pre-op nurse will instruct you on the time to drink the   Gatorade 2 (G2) depending on your surgery time. Color of the Gatorade may vary. Red is not allowed. Nothing else to drink after completing the  Gatorade 2 (G2).         If you have questions, please contact your surgeon's office.   CHG soap given to patient. Education provided and patient verbalized understanding.

## 2024-02-20 ENCOUNTER — Ambulatory Visit: Attending: General Surgery

## 2024-02-20 ENCOUNTER — Other Ambulatory Visit: Payer: Self-pay

## 2024-02-20 DIAGNOSIS — C50512 Malignant neoplasm of lower-outer quadrant of left female breast: Secondary | ICD-10-CM | POA: Insufficient documentation

## 2024-02-20 DIAGNOSIS — Z171 Estrogen receptor negative status [ER-]: Secondary | ICD-10-CM | POA: Diagnosis present

## 2024-02-20 DIAGNOSIS — R293 Abnormal posture: Secondary | ICD-10-CM | POA: Insufficient documentation

## 2024-02-20 NOTE — H&P (Signed)
 19 yof with palpable left breast mass. MM shows a left breast mass that is 1.6 cm in size. On US  this is 1.6x1.3x1.5 cm in size. There are no abnormal nodes on US . 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 She is here to discuss her options  Had tia 36 years ago but no issues since.autoimmune disorder nos that she is on steroids taper for now.  Review of Systems: A complete review of systems was obtained from the patient. I have reviewed this information and discussed as appropriate with the patient. See HPI as well for other ROS.  Review of Systems  All other systems reviewed and are negative.  Medical History: Past Medical History:  Diagnosis Date  Anemia  Autoimmune disease (CMS/HHS-HCC)  non-specific; has multiple episodes of inflammation per year, mostly involving joints but also causes significant fatigue; improved with steroids  Bone loss  History of blood transfusion  During Covid  Malignant neoplasm of lower-outer quadrant of left breast of female, estrogen receptor negative (CMS/HHS-HCC) 01/09/2024  01/09/2024: Left breast US -guided CNB of 5:00 mass (ribbon clip placed/verified) - invasive ductal carcinoma, grade 2, ER-, PR-, and HER2-   Patient Active Problem List  Diagnosis  Malignant neoplasm of left breast in female, estrogen receptor negative (CMS/HHS-HCC)  Genetic Test; Negative; Breast Cancer STAT Panel [facility] (01/2024)   Past Surgical History:  Procedure Laterality Date  PERCUTANEOUS BIOPSY BREAST Left 01/09/2024  01/09/2024: Left breast US -guided CNB of 5:00 mass (ribbon clip placed/verified) - invasive ductal carcinoma, grade 2, ER-, PR-, and HER2-  APPENDECTOMY  At the age of 7  OTHER SURGERY  bladder pexy for prolapse  TONSILLECTOMY Bilateral   No Known Allergies  Current Outpatient Medications on File Prior to Visit  Medication Sig Dispense Refill  ascorbic acid (VITAMIN C ORAL) Take by mouth  b complex vitamins tablet Take 1 tablet by mouth once daily  blue-green  algae, Spirulina, 500 mg Tab Take by mouth 2 (two) times daily  CYANOCOBALAMIN, VITAMIN B-12, SL Place 1,000 mg under the tongue  FUSION PLUS 130 mg iron- 1,250 mcg Cap Take 1 capsule by mouth once daily  glucosamine/methylsulfonylmeth (GLUCOSAMINE MSM ORAL) Take by mouth  Herbal Supplement [place]  omega-3s/dha/epa/fish oil (OMEGA 3 ORAL) Take by mouth  OREGANO OIL ORAL Take by mouth  VITAMIN D3 ORAL 5,000 Units   Family History  Problem Relation Age of Onset  Bladder Cancer Mother  High blood pressure (Hypertension) Father    Social History   Tobacco Use  Smoking Status Never  Smokeless Tobacco Never  Marital status: Married  Spouse name: Retail banker  Number of children: 5  Occupational History  Occupation: Retired  Tobacco Use  Smoking status: Never  Smokeless tobacco: Never  Vaping Use  Vaping status: Never Used  Substance and Sexual Activity  Alcohol use: Never  Drug use: Never  Sexual activity: Not Currently  Partners: Male  Birth control/protection: Post-menopausal  Social History Narrative  *Last updated 01/19/2024  Preferred pronouns: She/Her/Hers  Married, 5 living kids  Education: Education administrator  Occupation: Retired Education officer, museum (Language Arts)  Religious preference: [employer] day [employer]ist  Exercise: None routine  Diet: Vegetarian    Objective:   Vitals:  01/27/24 1044  BP: 137/83  Pulse: 78  Temp: 36.4 C (97.6 F)  Weight: 75.1 kg (165 lb 9.6 oz)  Height: 167.6 cm (5' 6)  PainSc: 0-No pain   Body mass index is 26.73 kg/m.  Physical Exam Constitutional:  Appearance: Normal appearance.  Chest:  Breasts: Right: No inverted nipple, mass or nipple discharge.  Left: Mass present. No inverted nipple or nipple discharge.  Lymphadenopathy:  Upper Body:  Right upper body: No supraclavicular adenopathy.  Left upper body: No supraclavicular adenopathy.  Neurological:  Mental Status: She is alert.    Assessment and Plan:    Malignant neoplasm of lower-outer quadrant of left breast of female, estrogen receptor negative (CMS/HHS-HCC)  Left breast seed guided lumpectomy, left ax sn biopsy  We discussed the staging and pathophysiology of breast cancer. We discussed all of the different options for treatment for breast cancer including surgery, chemotherapy, radiation therapy, and antiestrogen therapy.  We discussed a sentinel lymph node biopsy as she does not appear to having lymph node involvement right now. We discussed the performance of that with injection of Magtrace with small risk of discoloration. We discussed that there is a chance of having a positive node with a sentinel lymph node biopsy and we will await the permanent pathology to make any other first further decisions in terms of her treatment. We discussed up to a 5% risk lifetime of chronic shoulder pain as well as lymphedema associated with a sentinel lymph node biopsy. Discussed sozo testing preop and following her afterwards.   We discussed the options for treatment of the breast cancer which included lumpectomy versus a mastectomy. We discussed the performance of the lumpectomy with radioactive seed placement. We discussed a 5-10% chance of a positive margin requiring reexcision in the operating room. We also discussed that she will be recommended radiation therapy if she undergoes lumpectomy. We discussed mastectomy and the postoperative care for that as well. Mastectomy can be followed by reconstruction. The decision for lumpectomy vs mastectomy has no impact on decision for chemotherapy. She will be recommended some form of systemic therapy postop. Most mastectomy patients will not need radiation therapy. We discussed that there is no difference in her survival whether she undergoes lumpectomy with radiation therapy or antiestrogen therapy versus a mastectomy. There is also no real difference between her recurrence in the breast.  We discussed the risks  of operation including bleeding, infection, possible reoperation. She understands her further therapy will be based on what her stages at the time of her operation.

## 2024-02-20 NOTE — Therapy (Signed)
 OUTPATIENT PHYSICAL THERAPY BREAST CANCER BASELINE EVALUATION   Patient Name: [name] MRN: [id number] DOB:March 04, 1946, 78 y.o., female Today's Date: 02/20/2024  END OF SESSION:  PT End of Session - 02/20/24 1711     Visit Number 1    Number of Visits 2    Date for PT Re-Evaluation 04/02/24    Authorization Type [employer]    PT Start Time 1525    PT Stop Time 1600    PT Time Calculation (min) 35 min    Behavior During Therapy [facility] for tasks assessed/performed          Past Medical History:  Diagnosis Date   Abnormal laboratory test 03/02/2018   Anemia    due to GI bleed   Arthritis    bilateral knees   Chest pain    Complication of anesthesia    see note about TIA-32 yrs ago   Concussion 05/11/2013   Dizziness    ECHO and Stress test all normal 04/2015   Head pain 07/25/2015   recent blow to head   History of concussion    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   TIA (transient ischemic attack) 1988   occurred three days after anesthesia   Vertigo 08/07/2013   Past Surgical History:  Procedure Laterality Date   APPENDECTOMY     ARTERY BIOPSY Left 03/09/2018   Procedure: BIOPSY LEFT TEMPORAL ARTERY;  Surgeon: Kimble Agent, MD;  Location: [facility];  Service: General;  Laterality: Left;  MAC   BREAST BIOPSY Left 01/09/2024   US  LT BREAST BX W LOC DEV 1ST LESION IMG BX SPEC US  GUIDE 01/09/2024 [facility]MO[facility]   BREAST BIOPSY Left 02/17/2024   US  LT RADIOACTIVE SEED LOC 02/17/2024 [facility]   CERVICAL CONE BIOPSY     COLONOSCOPY     CYSTOCELE REPAIR     DILATATION & CURETTAGE/HYSTEROSCOPY WITH MYOSURE N/A 08/08/2015   Procedure: DILATATION & CURETTAGE/HYSTEROSCOPY WITH MYOSURE;  Surgeon: Dickie Carder, MD;  Location: [facility];  Service: Gynecology;  Laterality: N/A;   TONSILLECTOMY     TUBAL LIGATION     Patient Active Problem List   Diagnosis Date Noted   Malignant neoplasm of lower-outer quadrant of left breast of female, estrogen receptor negative  (HCC) 02/01/2024   Elevated blood pressure reading 10/12/2018   History of pericarditis 10/12/2018   Abnormal laboratory test 03/02/2018   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 Donnice Bury  REFERRING DIAG: Left Breast Cancer  THERAPY DIAG:  Malignant neoplasm of lower-outer quadrant of left breast of female, estrogen receptor negative (HCC)  Abnormal posture  Rationale for Evaluation and Treatment: Rehabilitation  ONSET DATE: 01/09/2024  SUBJECTIVE:  SUBJECTIVE STATEMENT: Patient reports she is here today to be seen by her medical team for her newly diagnosed left breast cancer.   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 It measures 1.6 cm and is located in the Lower-outer quadrant. It is triple Negative with a Ki67 of 30%. She is having a left Lumpectomy and SLNB on 02/21/2024. She is not sure yet if she will have chemotherapy, but will likely have radiation.  PATIENT GOALS:   reduce lymphedema risk and learn post op HEP.   PAIN:  Are you having pain? No  PRECAUTIONS: Active CA , poly arthralgia being seen by rheumatologist, TIA  RED FLAGS: None   HAND DOMINANCE: right  WEIGHT BEARING RESTRICTIONS: No  FALLS:  Has patient fallen in last 6 months? No  LIVING ENVIRONMENT: Patient lives with: husband and her daughter Lives in: House/apartment Has following equipment at home:   OCCUPATION: retired Runner, broadcasting/film/video  LEISURE: walking,stationary biking, wants to return to gym, sitting on deck, family time, reading  PRIOR LEVEL OF FUNCTION: Independent   OBJECTIVE: Note: Objective measures were completed at Evaluation unless  otherwise noted.  COGNITION: Overall cognitive status: Within functional limits for tasks assessed    POSTURE:  Forward head and rounded shoulders posture  UPPER EXTREMITY AROM/PROM:  A/PROM RIGHT   eval   Shoulder extension 62  Shoulder flexion 143  Shoulder abduction 175  Shoulder internal rotation 60  Shoulder external rotation 94    (Blank rows = not tested)  A/PROM LEFT   eval  Shoulder extension 64  Shoulder flexion 145  Shoulder abduction 165  Shoulder internal rotation 60  Shoulder external rotation 62, pain    (Blank rows = not tested)  CERVICAL AROM: All within Functionallimits:     UPPER EXTREMITY STRENGTH: WNL  LYMPHEDEMA ASSESSMENTS (in cm):   LANDMARK RIGHT   eval  10 cm proximal to olecranon process 31.6  Olecranon process 23.5  10 cm proximal to ulnar styloid process 17.7  Just proximal to ulnar styloid process 14.6  Across hand at thumb web space 18.6  At base of 2nd digit 5.9  (Blank rows = not tested)  LANDMARK LEFT   eval  10 cm proximal to olecranon process 31.5  Olecranon process 24.4  10 cm proximal to ulnar styloid process 18.65  Just proximal to ulnar styloid process 14.2  Across hand at thumb web space 18.5  At base of 2nd digit 6.2  (Blank rows = not tested)  L-DEX LYMPHEDEMA SCREENING:  The patient was assessed using the L-Dex machine today to produce a lymphedema index baseline score. The patient will be reassessed on a regular basis (typically every 3 months) to obtain new L-Dex scores. If the score is > 6.5 points away from his/her baseline score indicating onset of subclinical lymphedema, it will be recommended to wear a compression garment for 4 weeks, 12 hours per day and then be reassessed. If the score continues to be > 6.5 points from baseline at reassessment, we will initiate lymphedema treatment. Assessing in this manner has a 95% rate of preventing clinically significant lymphedema.   L-DEX FLOWSHEETS - 02/20/24  1600       L-DEX LYMPHEDEMA SCREENING   Measurement Type Unilateral    L-DEX MEASUREMENT EXTREMITY Upper Extremity    POSITION  Standing    DOMINANT SIDE Right    At Risk Side Left    BASELINE SCORE (UNILATERAL) 2.8  QUICK DASH SURVEY: 13.64  PATIENT EDUCATION:  Education details: Time spent educating patient on aspects of self-care to maximize post op recovery. Patient was educated on where and how to get a post op compression bra to use to reduce post op edema. Patient was also educated on the use of SOZO screenings and surveillance principles for early identification of lymphedema onset. She was instructed to use the post op pillow in the axilla for pressure and pain relief. Patient educated on lymphedema risk reduction and post op shoulder/posture HEP. Person educated: Patient Education method: Explanation, Demonstration, Handout Education comprehension: Patient verbalized understanding and returned demonstration  HOME EXERCISE PROGRAM: Patient was instructed today in a home exercise program today for post op shoulder range of motion. These included active assist shoulder flexion in sitting, scapular retraction, wall walking with shoulder abduction, and hands behind head external rotation.  She was encouraged to do these twice a day, holding 3 seconds and repeating 5 times when permitted by her physician.   ASSESSMENT:  CLINICAL IMPRESSION: Pts. multidisciplinary medical team met prior to her assessments to determine a recommended treatment plan. She is planning to have a left lumpectomy with SLNB on 02/21/2024. She will benefit from a post op PT reassessment to determine needs and from L-Dex screens every 3 months for 2 years to detect subclinical lymphedema.  Pt will benefit from skilled therapeutic intervention to improve on the following deficits: Decreased knowledge of precautions, impaired UE functional use, pain, decreased ROM, postural dysfunction.   PT  treatment/interventions: ADL/self-care home management, pt/family education, therapeutic exercise  REHAB POTENTIAL: Excellent  CLINICAL DECISION MAKING: Stable/uncomplicated  EVALUATION COMPLEXITY: Low   GOALS: Goals reviewed with patient? YES  LONG TERM GOALS: (STG=LTG)    Name Target Date Goal status  1 Pt will be able to verbalize understanding of pertinent lymphedema risk reduction practices relevant to her dx specifically related to skin care.  Baseline:  No knowledge 02/20/2024 Achieved at eval  2 Pt will be able to return demo and/or verbalize understanding of the post op HEP related to regaining shoulder ROM. Baseline:  No knowledge 02/20/2024 Achieved at eval  3 Pt will be able to verbalize understanding of the importance of viewing the post op After Breast CA Class video for further lymphedema risk reduction education and therapeutic exercise.  Baseline:  No knowledge 02/20/2024 Achieved at eval  4 Pt will demo she has regained full shoulder ROM and function post operatively compared to baselines.  Baseline: See objective measurements taken today. 04/02/2024 INITIAL    PLAN:  PT FREQUENCY/DURATION: EVAL and 1 follow up appointment.   PLAN FOR NEXT SESSION: will reassess 3-4 weeks post op to determine needs.   Patient will follow up at outpatient cancer rehab 3-4 weeks following surgery.  If the patient requires physical therapy at that time, a specific plan will be dictated and sent to the referring physician for approval. The patient was educated today on appropriate basic range of motion exercises to begin post operatively and the importance of viewing the After Breast Cancer class video following surgery.  Patient was educated today on lymphedema risk reduction practices as it pertains to recommendations that will benefit the patient immediately following surgery.  She verbalized good understanding.    Physical Therapy Information for After Breast Cancer  Surgery/Treatment:  Lymphedema is a swelling condition that you may be at risk for in your arm if you have lymph nodes removed from the armpit area.  After a sentinel node biopsy,  the risk is approximately 5-9% and is higher after an axillary node dissection.  There is treatment available for this condition and it is not life-threatening.  Contact your physician or physical therapist with concerns. You may begin the 4 shoulder/posture exercises (see additional sheet) when permitted by your physician (typically a week after surgery).  If you have drains, you may need to wait until those are removed before beginning range of motion exercises.  A general recommendation is to not lift your arms above shoulder height until drains are removed.  These exercises should be done to your tolerance and gently.  This is not a no pain/no gain type of recovery so listen to your body and stretch into the range of motion that you can tolerate, stopping if you have pain.  If you are having immediate reconstruction, ask your plastic surgeon about doing exercises as he or she may want you to wait. We encourage you to view the After Breast Cancer class video following surgery.  You will learn information related to lymphedema risk, prevention and treatment and additional exercises to regain mobility following surgery.   While undergoing any medical procedure or treatment, try to avoid blood pressure being taken or needle sticks from occurring on the arm on the side of cancer.   This recommendation begins after surgery and continues for the rest of your life.  This may help reduce your risk of getting lymphedema (swelling in your arm). An excellent resource for those seeking information on lymphedema is the National Lymphedema Network's web site. It can be accessed at www.lymphnet.org If you notice swelling in your hand, arm or breast at any time following surgery (even if it is many years from now), please contact your doctor  or physical therapist to discuss this.  Lymphedema can be treated at any time but it is easier for you if it is treated early on.  If you feel like your shoulder motion is not returning to normal in a reasonable amount of time, please contact your surgeon or physical therapist.  [facility] [phone]. [address], [place] [address]  [facility] CLASS After Breast Cancer Class  After Breast Cancer Class is a specially designed exercise class video to assist you in a safe recover after having breast cancer surgery.  In this video you will learn how to get back to full function whether your drains were just removed or if you had surgery a month ago. The video can be viewed on this page: [email or web address].boyd-meyer.org/ or on YouTube here: https://youtu.[email or web address].  Class Goals  Understand specific stretches to improve the flexibility of you chest and shoulder. Learn ways to safely strengthen your upper body and improve your posture. Understand the warning signs of infection and why you may be at risk for an arm infection. Learn about Lymphedema and prevention.  ** You do not need to view this video until after surgery.  Drains should be removed to participate in the recommended exercises on the video.  Patient was instructed today in a home exercise program today for post op shoulder range of motion. These included active assist shoulder flexion in sitting, scapular retraction, wall walking with shoulder abduction, and hands behind head external rotation.  She was encouraged to do these twice a day, holding 3 seconds and repeating 5 times when permitted by her physician.    Grayce JINNY Sheldon, PT 02/20/2024, 5:14 PM

## 2024-02-21 ENCOUNTER — Ambulatory Visit
Admission: RE | Admit: 2024-02-21 | Discharge: 2024-02-21 | Disposition: A | Discharge status: Home / Self Care | Source: Ambulatory Visit | Attending: General Surgery | Admitting: General Surgery

## 2024-02-21 ENCOUNTER — Other Ambulatory Visit: Payer: Self-pay

## 2024-02-21 ENCOUNTER — Ambulatory Visit (HOSPITAL_BASED_OUTPATIENT_CLINIC_OR_DEPARTMENT_OTHER)
Admission: RE | Admit: 2024-02-21 | Discharge: 2024-02-21 | Disposition: A | Discharge status: Home / Self Care | Attending: General Surgery | Admitting: General Surgery

## 2024-02-21 ENCOUNTER — Encounter (HOSPITAL_BASED_OUTPATIENT_CLINIC_OR_DEPARTMENT_OTHER): Payer: Self-pay | Admitting: Anesthesiology

## 2024-02-21 ENCOUNTER — Ambulatory Visit (HOSPITAL_BASED_OUTPATIENT_CLINIC_OR_DEPARTMENT_OTHER): Payer: Self-pay | Admitting: Anesthesiology

## 2024-02-21 ENCOUNTER — Encounter (HOSPITAL_BASED_OUTPATIENT_CLINIC_OR_DEPARTMENT_OTHER): Payer: Self-pay | Admitting: General Surgery

## 2024-02-21 ENCOUNTER — Encounter (HOSPITAL_BASED_OUTPATIENT_CLINIC_OR_DEPARTMENT_OTHER)
Admission: RE | Disposition: A | Discharge status: Home / Self Care | Payer: Self-pay | Source: Home / Self Care | Attending: General Surgery

## 2024-02-21 DIAGNOSIS — Z1722 Progesterone receptor negative status: Secondary | ICD-10-CM | POA: Insufficient documentation

## 2024-02-21 DIAGNOSIS — Z171 Estrogen receptor negative status [ER-]: Secondary | ICD-10-CM | POA: Diagnosis not present

## 2024-02-21 DIAGNOSIS — C50512 Malignant neoplasm of lower-outer quadrant of left female breast: Secondary | ICD-10-CM | POA: Insufficient documentation

## 2024-02-21 DIAGNOSIS — Z8673 Personal history of transient ischemic attack (TIA), and cerebral infarction without residual deficits: Secondary | ICD-10-CM | POA: Insufficient documentation

## 2024-02-21 DIAGNOSIS — C50912 Malignant neoplasm of unspecified site of left female breast: Secondary | ICD-10-CM | POA: Diagnosis not present

## 2024-02-21 DIAGNOSIS — Z1732 Human epidermal growth factor receptor 2 negative status: Secondary | ICD-10-CM | POA: Diagnosis not present

## 2024-02-21 DIAGNOSIS — M1990 Unspecified osteoarthritis, unspecified site: Secondary | ICD-10-CM | POA: Diagnosis not present

## 2024-02-21 DIAGNOSIS — Z01818 Encounter for other preprocedural examination: Secondary | ICD-10-CM

## 2024-02-21 HISTORY — PX: BREAST LUMPECTOMY WITH RADIOACTIVE SEED AND SENTINEL LYMPH NODE BIOPSY: SHX6550

## 2024-02-21 SURGERY — BREAST LUMPECTOMY WITH RADIOACTIVE SEED AND SENTINEL LYMPH NODE BIOPSY
Anesthesia: Regional | Site: Breast | Laterality: Left

## 2024-02-21 MED ORDER — FENTANYL CITRATE (PF) 100 MCG/2ML IJ SOLN
INTRAMUSCULAR | Status: AC
  Filled 2024-02-21: qty 2

## 2024-02-21 MED ORDER — EPHEDRINE SULFATE (PRESSORS) 50 MG/ML IJ SOLN
INTRAMUSCULAR | Status: DC | PRN
Start: 2024-02-21 — End: 2024-02-21
  Administered 2024-02-21 (×2): 5 mg via INTRAVENOUS

## 2024-02-21 MED ORDER — BUPIVACAINE HCL (PF) 0.25 % IJ SOLN
INTRAMUSCULAR | Status: AC
Start: 2024-02-21 — End: 2024-02-21
  Filled 2024-02-21: qty 30

## 2024-02-21 MED ORDER — CEFAZOLIN SODIUM-DEXTROSE 2-4 GM/100ML-% IV SOLN
INTRAVENOUS | Status: AC
  Filled 2024-02-21: qty 100

## 2024-02-21 MED ORDER — CHLORHEXIDINE GLUCONATE CLOTH 2 % EX PADS: 6.0000 | MEDICATED_PAD | Freq: Once | CUTANEOUS | Status: DC

## 2024-02-21 MED ORDER — OXYCODONE HCL 5 MG PO TABS
ORAL_TABLET | ORAL | Status: AC
  Filled 2024-02-21: qty 1

## 2024-02-21 MED ORDER — MIDAZOLAM HCL 2 MG/2ML IJ SOLN
INTRAMUSCULAR | Status: AC
  Filled 2024-02-21: qty 2

## 2024-02-21 MED ORDER — ENSURE PRE-SURGERY PO LIQD: 296.0000 mL | Freq: Once | ORAL | Status: DC

## 2024-02-21 MED ORDER — ACETAMINOPHEN 500 MG PO TABS
1000.0000 mg | ORAL_TABLET | ORAL | Status: AC
  Administered 2024-02-21: 1000 mg via ORAL

## 2024-02-21 MED ORDER — FENTANYL CITRATE (PF) 100 MCG/2ML IJ SOLN: 25.0000 ug | INTRAMUSCULAR | Status: DC | PRN

## 2024-02-21 MED ORDER — ROCURONIUM BROMIDE 10 MG/ML (PF) SYRINGE
PREFILLED_SYRINGE | INTRAVENOUS | Status: AC
Start: 2024-02-21 — End: 2024-02-21
  Filled 2024-02-21: qty 20

## 2024-02-21 MED ORDER — DEXMEDETOMIDINE HCL IN NACL 80 MCG/20ML IV SOLN
INTRAVENOUS | Status: AC
  Filled 2024-02-21: qty 20

## 2024-02-21 MED ORDER — DEXAMETHASONE SODIUM PHOSPHATE 10 MG/ML IJ SOLN
INTRAMUSCULAR | Status: AC
Start: 2024-02-21 — End: 2024-02-21
  Filled 2024-02-21: qty 1

## 2024-02-21 MED ORDER — PROPOFOL 500 MG/50ML IV EMUL
INTRAVENOUS | Status: DC | PRN
  Administered 2024-02-21: 50 ug/kg/min via INTRAVENOUS

## 2024-02-21 MED ORDER — DEXAMETHASONE SODIUM PHOSPHATE 4 MG/ML IJ SOLN
INTRAMUSCULAR | Status: DC | PRN
  Administered 2024-02-21: 4 mg via INTRAVENOUS

## 2024-02-21 MED ORDER — ONDANSETRON HCL 4 MG/2ML IJ SOLN
INTRAMUSCULAR | Status: DC | PRN
  Administered 2024-02-21: 4 mg via INTRAVENOUS

## 2024-02-21 MED ORDER — FENTANYL CITRATE (PF) 100 MCG/2ML IJ SOLN
INTRAMUSCULAR | Status: DC | PRN
  Administered 2024-02-21 (×2): 50 ug via INTRAVENOUS

## 2024-02-21 MED ORDER — ONDANSETRON HCL 4 MG/2ML IJ SOLN: 4.0000 mg | Freq: Once | INTRAMUSCULAR | Status: DC | PRN

## 2024-02-21 MED ORDER — MIDAZOLAM HCL 2 MG/2ML IJ SOLN
1.0000 mg | Freq: Once | INTRAMUSCULAR | Status: AC
  Administered 2024-02-21: 1 mg via INTRAVENOUS

## 2024-02-21 MED ORDER — MEPERIDINE HCL 25 MG/ML IJ SOLN: 6.2500 mg | INTRAMUSCULAR | Status: DC | PRN

## 2024-02-21 MED ORDER — PROPOFOL 10 MG/ML IV BOLUS
INTRAVENOUS | Status: DC | PRN
  Administered 2024-02-21: 150 mg via INTRAVENOUS

## 2024-02-21 MED ORDER — OXYCODONE HCL 5 MG PO TABS
5.0000 mg | ORAL_TABLET | Freq: Once | ORAL | Status: AC | PRN
  Administered 2024-02-21: 5 mg via ORAL

## 2024-02-21 MED ORDER — CEFAZOLIN SODIUM-DEXTROSE 2-4 GM/100ML-% IV SOLN
2.0000 g | INTRAVENOUS | Status: AC
  Administered 2024-02-21: 2 g via INTRAVENOUS

## 2024-02-21 MED ORDER — BUPIVACAINE HCL (PF) 0.25 % IJ SOLN
INTRAMUSCULAR | Status: DC | PRN
  Administered 2024-02-21: 30 mL via PERINEURAL

## 2024-02-21 MED ORDER — ONDANSETRON HCL 4 MG/2ML IJ SOLN
INTRAMUSCULAR | Status: AC
  Filled 2024-02-21: qty 2

## 2024-02-21 MED ORDER — LIDOCAINE HCL (CARDIAC) PF 100 MG/5ML IV SOSY
PREFILLED_SYRINGE | INTRAVENOUS | Status: DC | PRN
  Administered 2024-02-21: 10 mg via INTRAVENOUS

## 2024-02-21 MED ORDER — TRAMADOL HCL 50 MG PO TABS: 50.0000 mg | ORAL_TABLET | Freq: Four times a day (QID) | ORAL | 0 refills | Status: AC | PRN

## 2024-02-21 MED ORDER — MAGTRACE LYMPHATIC TRACER
INTRAMUSCULAR | Status: DC | PRN
  Administered 2024-02-21: 2 mL via INTRAMUSCULAR

## 2024-02-21 MED ORDER — FENTANYL CITRATE (PF) 100 MCG/2ML IJ SOLN
50.0000 ug | Freq: Once | INTRAMUSCULAR | Status: AC
  Administered 2024-02-21: 50 ug via INTRAVENOUS

## 2024-02-21 MED ORDER — ACETAMINOPHEN 500 MG PO TABS
ORAL_TABLET | ORAL | Status: AC
Start: 2024-02-21 — End: 2024-02-21
  Filled 2024-02-21: qty 2

## 2024-02-21 MED ORDER — LACTATED RINGERS IV SOLN
INTRAVENOUS | Status: DC
Start: 2024-02-21 — End: 2024-02-21

## 2024-02-21 MED ORDER — PROPOFOL 10 MG/ML IV BOLUS
INTRAVENOUS | Status: AC
Start: 2024-02-21 — End: 2024-02-21
  Filled 2024-02-21: qty 20

## 2024-02-21 MED ORDER — OXYCODONE HCL 5 MG/5ML PO SOLN: 5.0000 mg | Freq: Once | ORAL | Status: AC | PRN

## 2024-02-21 SURGICAL SUPPLY — 48 items
BINDER BREAST LRG (GAUZE/BANDAGES/DRESSINGS) IMPLANT
BINDER BREAST MEDIUM (GAUZE/BANDAGES/DRESSINGS) IMPLANT
BINDER BREAST XLRG (GAUZE/BANDAGES/DRESSINGS) IMPLANT
BINDER BREAST XXLRG (GAUZE/BANDAGES/DRESSINGS) IMPLANT
BLADE SURG 15 STRL LF DISP TIS (BLADE) ×1 IMPLANT
CANISTER SUC SOCK COL 7IN (MISCELLANEOUS) IMPLANT
CANISTER SUCT 1200ML W/VALVE (MISCELLANEOUS) IMPLANT
CHLORAPREP W/TINT 26 (MISCELLANEOUS) ×1 IMPLANT
CLIP APPLIE 9.375 MED OPEN (MISCELLANEOUS) IMPLANT
CLIP TI WIDE RED SMALL 6 (CLIP) ×1 IMPLANT
COVER BACK TABLE 60X90IN (DRAPES) ×1 IMPLANT
COVER MAYO STAND STRL (DRAPES) ×1 IMPLANT
COVER PROBE CYLINDRICAL 5X96 (MISCELLANEOUS) ×1 IMPLANT
DERMABOND ADVANCED .7 DNX12 (GAUZE/BANDAGES/DRESSINGS) ×1 IMPLANT
DRAPE LAPAROSCOPIC ABDOMINAL (DRAPES) ×1 IMPLANT
DRAPE UTILITY XL STRL (DRAPES) ×1 IMPLANT
ELECT COATED BLADE 2.86 ST (ELECTRODE) ×1 IMPLANT
ELECTRODE REM PT RTRN 9FT ADLT (ELECTROSURGICAL) ×1 IMPLANT
GLOVE BIO SURGEON STRL SZ7 (GLOVE) ×2 IMPLANT
GLOVE BIOGEL PI IND STRL 7.5 (GLOVE) ×1 IMPLANT
GOWN STRL REUS W/ TWL LRG LVL3 (GOWN DISPOSABLE) ×2 IMPLANT
HEMOSTAT ARISTA ABSORB 3G PWDR (HEMOSTASIS) IMPLANT
KIT MARKER MARGIN INK (KITS) ×1 IMPLANT
NDL HYPO 25X1 1.5 SAFETY (NEEDLE) ×1 IMPLANT
NDL SAFETY ECLIPSE 18X1.5 (NEEDLE) IMPLANT
NEEDLE HYPO 25X1 1.5 SAFETY (NEEDLE) ×1 IMPLANT
NS IRRIG 1000ML POUR BTL (IV SOLUTION) IMPLANT
PACK BASIN DAY SURGERY FS (CUSTOM PROCEDURE TRAY) ×1 IMPLANT
PENCIL SMOKE EVACUATOR (MISCELLANEOUS) ×1 IMPLANT
RETRACTOR ONETRAX LX 90X20 (MISCELLANEOUS) IMPLANT
SLEEVE SCD COMPRESS KNEE MED (STOCKING) ×1 IMPLANT
SPIKE FLUID TRANSFER (MISCELLANEOUS) IMPLANT
SPONGE T-LAP 4X18 ~~LOC~~+RFID (SPONGE) ×1 IMPLANT
STRIP CLOSURE SKIN 1/2X4 (GAUZE/BANDAGES/DRESSINGS) ×1 IMPLANT
SUT ETHILON 2 0 FS 18 (SUTURE) IMPLANT
SUT MNCRL AB 4-0 PS2 18 (SUTURE) ×1 IMPLANT
SUT MON AB 5-0 PS2 18 (SUTURE) IMPLANT
SUT SILK 2 0 SH (SUTURE) IMPLANT
SUT SILK 3 0 SH 30 (SUTURE) IMPLANT
SUT VIC AB 2-0 SH 27XBRD (SUTURE) ×1 IMPLANT
SUT VIC AB 3-0 SH 27X BRD (SUTURE) ×1 IMPLANT
SUT VIC AB 5-0 PS2 18 (SUTURE) IMPLANT
SYR CONTROL 10ML LL (SYRINGE) ×1 IMPLANT
TOWEL GREEN STERILE FF (TOWEL DISPOSABLE) ×1 IMPLANT
TRACER MAGTRACE VIAL (MISCELLANEOUS) IMPLANT
TRAY FAXITRON CT DISP (TRAY / TRAY PROCEDURE) ×1 IMPLANT
TUBE CONNECTING 20X1/4 (TUBING) IMPLANT
YANKAUER SUCT BULB TIP NO VENT (SUCTIONS) IMPLANT

## 2024-02-21 NOTE — Anesthesia Procedure Notes (Signed)
 Procedure Name: LMA Insertion Date/Time: 02/21/2024 1:42 PM  Performed by: Emilio Rock BIRCH, CRNAPre-anesthesia Checklist: Patient identified, Emergency Drugs available, Suction available and Patient being monitored Patient Re-evaluated:Patient Re-evaluated prior to induction Oxygen Delivery Method: Circle System Utilized Preoxygenation: Pre-oxygenation with 100% oxygen Induction Type: IV induction Ventilation: Mask ventilation without difficulty LMA: LMA inserted LMA Size: 4.0 Number of attempts: 1 Airway Equipment and Method: bite block Placement Confirmation: positive ETCO2 Tube secured with: Tape Dental Injury: Teeth and Oropharynx as per pre-operative assessment

## 2024-02-21 NOTE — Progress Notes (Signed)
 Assisted Dr. Tacy Dura with left, pectoralis, ultrasound guided block. Side rails up, monitors on throughout procedure. See vital signs in flow sheet. Tolerated Procedure well.

## 2024-02-21 NOTE — Anesthesia Procedure Notes (Signed)
 Anesthesia Regional Block: Pectoralis block   Pre-Anesthetic Checklist: , timeout performed,  Correct Patient, Correct Site, Correct Laterality,  Correct Procedure, Correct Position, site marked,  Risks and benefits discussed,  Surgical consent,  Pre-op evaluation,  At surgeon's request and post-op pain management  Laterality: Left  Prep: chloraprep       Needles:  Injection technique: Single-shot  Needle Type: Echogenic Stimulator Needle     Needle Length: 5cm  Needle Gauge: 22     Additional Needles:   Procedures:,,,, ultrasound used (permanent image in chart),,    Narrative:  Start time: 02/21/2024 12:50 PM End time: 02/21/2024 12:58 PM Injection made incrementally with aspirations every 5 mL.  Performed by: Personally  Anesthesiologist: Mallory Manus, MD  Additional Notes: Functioning IV was confirmed and monitors were applied.  A 50mm 22ga Arrow echogenic stimulator needle was used. Sterile prep and drape,hand hygiene and sterile gloves were used. Ultrasound guidance: relevant anatomy identified, needle position confirmed, local anesthetic spread visualized around nerve(s)., vascular puncture avoided.  Image printed for medical record. Negative aspiration and negative test dose prior to incremental administration of local anesthetic. The patient tolerated the procedure well.

## 2024-02-21 NOTE — Discharge Instructions (Addendum)
 [facility] Office Phone Number [phone]  POST OP INSTRUCTIONS Take 400 mg of ibuprofen  every 8 hours or 650 mg tylenol  every 6 hours for next 72 hours then as needed. Use ice several times daily also.  A prescription for pain medication may be given to you upon discharge.  Take your pain medication as prescribed, if needed.  If narcotic pain medicine is not needed, then you may take acetaminophen  (Tylenol ), naprosyn  (Alleve) or ibuprofen  (Advil ) as needed. Take your usually prescribed medications unless otherwise directed If you need a refill on your pain medication, please contact your pharmacy.  They will contact our office to request authorization.  Prescriptions will not be filled after 5pm or on week-ends. You should eat very light the first 24 hours after surgery, such as soup, crackers, pudding, etc.  Resume your normal diet the day after surgery. Most patients will experience some swelling and bruising in the breast.  Ice packs and a good support bra will help.  Wear the breast binder provided or a sports bra for 72 hours day and night.  After that wear a sports bra during the day until you return to the office. Swelling and bruising can take several days to resolve.  It is common to experience some constipation if taking pain medication after surgery.  Increasing fluid intake and taking a stool softener will usually help or prevent this problem from occurring.  A mild laxative (Milk of Magnesia or Miralax) should be taken according to package directions if there are no bowel movements after 48 hours. I used skin glue on the incision, you may shower in 24 hours.  The glue will flake off over the next 2-3 weeks as will the steristrips.   Any sutures or staples will be removed at the office during your follow-up visit. ACTIVITIES:  You may resume regular daily activities (gradually increasing) beginning the next day.  Wearing a good support bra or sports bra minimizes pain and  swelling.  You may have sexual intercourse when it is comfortable. You may drive when you no longer are taking prescription pain medication, you can comfortably wear a seatbelt, and you can safely maneuver your car and apply brakes. RETURN TO WORK:  ______________________________________________________________________________________ Rosine should see your doctor in the office for a follow-up appointment approximately two to three weeks after your surgery.  Your doctor's nurse will typically make your follow-up appointment when she calls you with your pathology report.  Expect your pathology report 3-4 business days after your surgery.  You may call to check if you do not hear from us  after three days.  WHEN TO CALL DR WAKEFIELD: Fever over 101.0 Nausea and/or vomiting. Extreme swelling or bruising. Continued bleeding from incision. Increased pain, redness, or drainage from the incision.  The clinic staff is available to answer your questions during regular business hours.  Please don't hesitate to call and ask to speak to one of the nurses for clinical concerns.  If you have a medical emergency, go to the nearest emergency room or call 911.  A surgeon from [facility] is always on call at the hospital.  For further questions, please visit [email or web address]   Post Anesthesia Home Care Instructions  Activity: Get plenty of rest for the remainder of the day. A responsible individual must stay with you for 24 hours following the procedure.  For the next 24 hours, DO NOT: -Drive a car -Advertising copywriter -Drink alcoholic beverages -Take any medication unless instructed by  your physician -Make any legal decisions or sign important papers.  Meals: Start with liquid foods such as gelatin or soup. Progress to regular foods as tolerated. Avoid greasy, spicy, heavy foods. If nausea and/or vomiting occur, drink only clear liquids until the nausea and/or vomiting subsides.  Call your physician if vomiting continues.  Special Instructions/Symptoms: Your throat may feel dry or sore from the anesthesia or the breathing tube placed in your throat during surgery. If this causes discomfort, gargle with warm salt water. The discomfort should disappear within 24 hours.  If you had a scopolamine  patch placed behind your ear for the management of post- operative nausea and/or vomiting:  1. The medication in the patch is effective for 72 hours, after which it should be removed.  Wrap patch in a tissue and discard in the trash. Wash hands thoroughly with soap and water. 2. You may remove the patch earlier than 72 hours if you experience unpleasant side effects which may include dry mouth, dizziness or visual disturbances. 3. Avoid touching the patch. Wash your hands with soap and water after contact with the patch.     No tylenol  until after 645pm

## 2024-02-21 NOTE — Transfer of Care (Addendum)
 Immediate Anesthesia Transfer of Care Note  Patient: [name]  Procedure(s) Performed: BREAST LUMPECTOMY WITH RADIOACTIVE SEED AND SENTINEL LYMPH NODE BIOPSY (Left: Breast)  Patient Location: PACU  Anesthesia Type:General  Level of Consciousness: drowsy and patient cooperative  Airway & Oxygen Therapy: Patient Spontanous Breathing and Patient connected to face mask oxygen  Post-op Assessment: Report given to RN and Post -op Vital signs reviewed and stable  Post vital signs: Reviewed and stable  Last Vitals:  Vitals Value Taken Time  BP 120/64 02/21/24 15:00  Temp 36.7 C 02/21/24 15:00  Pulse 68 02/21/24 15:06  Resp 13 02/21/24 15:06  SpO2 100 % 02/21/24 15:06  Vitals shown include unfiled device data.  Last Pain:  Vitals:   02/21/24 1234  TempSrc: Oral  PainSc: 0-No pain      Patients Stated Pain Goal: 2 (02/21/24 1234)  Complications: No notable events documented.

## 2024-02-21 NOTE — Anesthesia Preprocedure Evaluation (Addendum)
 Anesthesia Evaluation  Patient identified by MRN, date of birth, ID band Patient awake    Reviewed: Allergy & Precautions, H&P , NPO status , Patient's Chart, lab work & pertinent test results  Airway Mallampati: I  TM Distance: >3 FB Neck ROM: Full    Dental no notable dental hx. (+) Teeth Intact, Dental Advisory Given, Caps   Pulmonary neg pulmonary ROS   Pulmonary exam normal breath sounds clear to auscultation       Cardiovascular Exercise Tolerance: Good negative cardio ROS Normal cardiovascular exam Rhythm:Regular Rate:Normal     Neuro/Psych  Headaches TIAnegative neurological ROS  negative psych ROS   GI/Hepatic negative GI ROS, Neg liver ROS,,,  Endo/Other  negative endocrine ROS    Renal/GU negative Renal ROS  negative genitourinary   Musculoskeletal negative musculoskeletal ROS (+) Arthritis , Osteoarthritis,    Abdominal   Peds negative pediatric ROS (+)  Hematology negative hematology ROS (+) Blood dyscrasia, anemia   Anesthesia Other Findings   Reproductive/Obstetrics negative OB ROS                              Anesthesia Physical Anesthesia Plan  ASA: 2  Anesthesia Plan: General and Regional   Post-op Pain Management: Tylenol  PO (pre-op)*, Celebrex PO (pre-op)* and Regional block*   Induction: Intravenous  PONV Risk Score and Plan: 3 and Ondansetron , Dexamethasone , Treatment may vary due to age or medical condition and Midazolam   Airway Management Planned: Oral ETT and LMA  Additional Equipment: None  Intra-op Plan:   Post-operative Plan: Extubation in OR  Informed Consent: I have reviewed the patients History and Physical, chart, labs and discussed the procedure including the risks, benefits and alternatives for the proposed anesthesia with the patient or authorized representative who has indicated his/her understanding and acceptance.     Dental advisory  given  Plan Discussed with: Anesthesiologist and CRNA  Anesthesia Plan Comments: (Discussed both nerve block for pain relief post-op and GA; including NV, sore throat, dental injury, and pulmonary complications)         Anesthesia Quick Evaluation

## 2024-02-21 NOTE — Interval H&P Note (Signed)
 History and Physical Interval Note:  02/21/2024 1:24 PM  [name]  has presented today for surgery, with the diagnosis of LEFT BREAST CANCER.  The various methods of treatment have been discussed with the patient and family. After consideration of risks, benefits and other options for treatment, the patient has consented to  Procedure(s) with comments: BREAST LUMPECTOMY WITH RADIOACTIVE SEED AND SENTINEL LYMPH NODE BIOPSY (Left) - LMA LEFT BREAST SEED GUIDED LUMPECTOMY LEFT AXILLARY SENTINEL NODE BIOPSY as a surgical intervention.  The patient's history has been reviewed, patient examined, no change in status, stable for surgery.  I have reviewed the patient's chart and labs.  Questions were answered to the patient's satisfaction.     Donnice Bury

## 2024-02-21 NOTE — Op Note (Signed)
 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  Left breast seed guided lumpectomy 2.  Injection of mag trace for sentinel lymph node identification 3.  Left deep axillary sentinel lymph node biopsy Surgeon: Dr. Adina Bury Anesthesia: General With a pec block Estimated blood loss: Less than 50 cc Specimens: 1.  Left breast mass marked with paint containing seed and clip 2.  Additional inferior margin marked short superior, long lateral, double deep 3.  Left deep axillary sentinel lymph nodes with highest count of 1428 Complications: None Drains: None Sponge needle count was correct completion Disposition recovery stable addition  Indications:78 yof with palpable left breast mass. MM shows a left breast mass that is 1.6 cm in size. On US  this is 1.6x1.3x1.5 cm in size. There are no abnormal nodes on US . 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  She is still not sure if she wants to receive chemotherapy.  We discussed proceeding with a lumpectomy and sentinel lymph node biopsy.  Procedure: After informed consent was obtained she was taken to the operating room.  She was given antibiotics.  SCDs were in place.  She was placed under general anesthesia without complication.  She was prepped and draped in a standard sterile surgical fashion.  A surgical timeout was then performed.  I first injected 2 cc of mag trace in the subareolar position and massaged this for 5 minutes.  I then located the mass as well as the seed in the inferior pole of the breast.  This was very close to the skin so I elected to make an elliptical incision overlying the mass and removed the skin that was immediately anterior to it.  This is the anterior margin of the tumor.  I then remove the mass as well as the surrounding tissue and attempt to get a clear margin.  I then marked this with paint.  Mammography confirmed removal of the clip and the seed.   I then did 3D imaging and it looks like I was close to my inferior margin so I removed more additional inferior margin and marked it as above.  The posterior margin is already the muscle.  I then placed a couple clips in this cavity.  I mobilized the breast tissue and closed with 2-0 Vicryl.  The skin was closed with 3-0 Vicryl and 4 Monocryl.  Eventually glue and Steri-Strips were applied.  I then made an incision below the axillary hairline.  I went through the axillary fascia and entered the axilla.  I was able to identify at least a couple of nodes that appeared brownish and had elevated counts.  The highest count was 1428.  I removed this node as well as what appeared to be a couple small ones next to it that were brownish in color.  There was no background activity.  I then obtained hemostasis.  I closed this with 2-0 Vicryl, 3-0 Vicryl and 4-0 Monocryl.  Glue and Steri-Strips were applied.  She tolerated this well was extubated and transferred to recovery stable.

## 2024-02-22 ENCOUNTER — Encounter (HOSPITAL_BASED_OUTPATIENT_CLINIC_OR_DEPARTMENT_OTHER): Payer: Self-pay | Admitting: General Surgery

## 2024-02-22 NOTE — Anesthesia Postprocedure Evaluation (Signed)
 Anesthesia Post Note  Patient: [name]  Procedure(s) Performed: BREAST LUMPECTOMY WITH RADIOACTIVE SEED AND SENTINEL LYMPH NODE BIOPSY (Left: Breast)     Patient location during evaluation: PACU Anesthesia Type: Regional and General Level of consciousness: awake and alert Pain management: pain level controlled Vital Signs Assessment: post-procedure vital signs reviewed and stable Respiratory status: spontaneous breathing, nonlabored ventilation, respiratory function stable and patient connected to nasal cannula oxygen Cardiovascular status: blood pressure returned to baseline and stable Postop Assessment: no apparent nausea or vomiting Anesthetic complications: no   No notable events documented.  Last Vitals:  Vitals:   02/21/24 1515 02/21/24 1539  BP: 123/77 123/82  Pulse: 70 71  Resp: 19 16  Temp:  (!) 36.4 C  SpO2: 100% 99%    Last Pain:  Vitals:   02/21/24 1234  TempSrc: Oral                 [name]

## 2024-02-23 LAB — SURGICAL PATHOLOGY

## 2024-02-27 ENCOUNTER — Encounter: Payer: Self-pay | Admitting: *Deleted

## 2024-03-02 NOTE — Progress Notes (Signed)
   PROVIDER:  DONNICE CARLIN BURY, MD  MRN: [id number] DOB: [birth year] DATE OF ENCOUNTER: 03/02/2024 Interval History:     54 yof s/p lump/sn.  She has some pain and some axillary swelling but otherwise fine. 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  Due to see oncology next week as well as PT    Physical Examination:   Physical Exam   Incisions without infection, mild axillary seroma   Assessment and Plan:     She is overall doing well.  She is due to see oncology to discuss adjuvant therapy soon.  She is also has an appointment to see physical therapy.  I did drain about 50 cc of axillary seroma today and I will see her again next week.  MATTHEW CARLIN BURY, MD

## 2024-03-08 ENCOUNTER — Encounter: Payer: Self-pay | Admitting: *Deleted

## 2024-03-08 ENCOUNTER — Inpatient Hospital Stay: Attending: Hematology and Oncology | Admitting: Hematology and Oncology

## 2024-03-08 VITALS — BP 117/59 | HR 86 | Temp 98.0°F | Resp 17 | Ht 66.0 in | Wt 159.3 lb

## 2024-03-08 DIAGNOSIS — C50512 Malignant neoplasm of lower-outer quadrant of left female breast: Secondary | ICD-10-CM | POA: Insufficient documentation

## 2024-03-08 DIAGNOSIS — K5903 Drug induced constipation: Secondary | ICD-10-CM | POA: Diagnosis not present

## 2024-03-08 DIAGNOSIS — D701 Agranulocytosis secondary to cancer chemotherapy: Secondary | ICD-10-CM | POA: Insufficient documentation

## 2024-03-08 DIAGNOSIS — D6481 Anemia due to antineoplastic chemotherapy: Secondary | ICD-10-CM | POA: Insufficient documentation

## 2024-03-08 DIAGNOSIS — Z171 Estrogen receptor negative status [ER-]: Secondary | ICD-10-CM | POA: Diagnosis not present

## 2024-03-08 DIAGNOSIS — Z5111 Encounter for antineoplastic chemotherapy: Secondary | ICD-10-CM | POA: Insufficient documentation

## 2024-03-08 MED ORDER — PROCHLORPERAZINE MALEATE 10 MG PO TABS: 10.0000 mg | ORAL_TABLET | Freq: Four times a day (QID) | ORAL | 1 refills | Status: AC | PRN

## 2024-03-08 MED ORDER — ONDANSETRON HCL 8 MG PO TABS: 8.0000 mg | ORAL_TABLET | Freq: Three times a day (TID) | ORAL | 1 refills | Status: AC | PRN

## 2024-03-08 MED ORDER — DEXAMETHASONE 4 MG PO TABS: 4.0000 mg | ORAL_TABLET | Freq: Every day | ORAL | 0 refills | Status: AC

## 2024-03-08 NOTE — Assessment & Plan Note (Signed)
 01/09/24: Palpable left breast mass: 1.6 cm 5 o'clock position, axilla negative, biopsy: Grade 2 IDC ER 0%, PR 0%, Ki67 30%, HER2 0   Treatment plan: 02/21/2024: Left lumpectomy: Grade 2 IDC 2.2 cm, margins negative, ER 0%, PR 0%, HER2 negative, Ki67 30%, 0/4 lymph nodes negative Adjuvant chemotherapy with Taxotere and Cytoxan x 4 Adjuvant radiation therapy  Chemotherapy Counseling: I discussed the risks and benefits of chemotherapy including the risks of nausea/ vomiting, risk of infection from low WBC count, fatigue due to chemo or anemia, bruising or bleeding due to low platelets, mouth sores, loss/ change in taste and decreased appetite. Liver and kidney function will be monitored through out chemotherapy as abnormalities in liver and kidney function may be a side effect of treatment.  Neuropathy risk from Taxotere was discussed in detail. Risk of permanent bone marrow dysfunction and leukemia due to chemo were also discussed.  Return to clinic in 2 weeks to start chemotherapy

## 2024-03-08 NOTE — Progress Notes (Signed)
 Patient Care Team: Theo Iha, MD as PCP - General (Internal Medicine) Rutherford Gain, MD as Consulting Physician (Obstetrics and Gynecology) Theo Iha, MD (Internal Medicine) Tyree Nanetta SAILOR, RN as Oncology Nurse Navigator Gerome, Devere HERO, RN as Oncology Nurse Navigator [name] Cough, MD as Consulting Physician (General Surgery) Odean Potts, MD as Consulting Physician (Hematology and Oncology) Izell Domino, MD as Attending Physician (Radiation Oncology)  DIAGNOSIS:  Encounter Diagnosis  Name Primary?   Malignant neoplasm of lower-outer quadrant of left breast of female, estrogen receptor negative (HCC) Yes    SUMMARY OF ONCOLOGIC HISTORY: Oncology History  Malignant neoplasm of lower-outer quadrant of left breast of female, estrogen receptor negative (HCC)  01/09/2024 Initial Diagnosis   Palpable left breast mass: 1.6 cm 5 o'clock position, axilla negative, biopsy: Grade 2 IDC ER 0%, PR 0%, Ki67 30%, HER2 0   02/01/2024 Cancer Staging   Staging form: Breast, AJCC 8th Edition - Clinical: Stage IB (cT1c, cN0, cM0, G2, ER-, PR-, HER2-) - Signed by Odean Potts, MD on 02/01/2024 Stage prefix: Initial diagnosis Histologic grading system: 3 grade system   02/21/2024 Surgery   Left lumpectomy: Grade 2 IDC 2.2 cm, margins negative, ER 0%, PR 0%, HER2 negative, Ki67 30%, 0/4 lymph nodes negative     CHIEF COMPLIANT: Follow-up to discuss adjuvant treatment plan  HISTORY OF PRESENT ILLNESS: [name] is 78 year old with above-mentioned history of breast cancer who underwent lumpectomy and is here today to discuss pathology report.  She is healing recovering fairly well.  She has had seroma that was drained twice so far.  She is not in any pain or discomfort.     ALLERGIES:  has no known allergies.  MEDICATIONS:  Current Outpatient Medications  Medication Sig Dispense Refill   acetaminophen  (TYLENOL ) 500 MG tablet Take 1,000 mg by mouth every 6 (six) hours  as needed for moderate pain.     Ascorbic Acid (VITAMIN C) 1000 MG tablet Take 1,000 mg by mouth daily.     b complex vitamins tablet Take 1 tablet by mouth daily.     BLACK CURRANT SEED OIL PO Take by mouth.     Cholecalciferol (VITAMIN D3) 50 MCG (2000 UT) capsule Vitamin D3     Ginkgo Biloba (GINKOBA PO) Take 1 tablet by mouth daily.     Glucosamine HCl (GLUCOSAMINE PO) Take 3 capsules by mouth daily.     Iron-Vitamin C (IRON 100/C) 100-250 MG TABS Take 1 capsule by mouth daily.      MAGNESIUM GLYCINATE PLUS PO Take 400 mg by mouth daily.      Polyvinyl Alcohol-Povidone (REFRESH OP) Apply 1 drop to eye daily as needed (dry eyes).     Probiotic Product (PROBIOTIC & ACIDOPHILUS EX ST PO) Take 1 tablet by mouth daily.      Protein POWD Take 1 scoop by mouth daily. Mix with water     traMADol  (ULTRAM ) 50 MG tablet Take 1 tablet (50 mg total) by mouth every 6 (six) hours as needed. 10 tablet 0   No current facility-administered medications for this visit.    PHYSICAL EXAMINATION: ECOG PERFORMANCE STATUS: 1 - Symptomatic but completely ambulatory  Vitals:   03/08/24 1507  BP: (!) 117/59  Pulse: 86  Resp: 17  Temp: 98 F (36.7 C)  SpO2: 100%   Filed Weights   03/08/24 1507  Weight: 159 lb 4.8 oz (72.3 kg)      LABORATORY DATA:  I have reviewed the data as listed  Latest Ref Rng & Units 02/03/2024   12:06 PM 04/27/2021    2:20 PM 02/18/2019    6:05 PM  CMP  Glucose 70 - 99 mg/dL 89  71  879   BUN 8 - 23 mg/dL 22  10  10    Creatinine 0.44 - 1.00 mg/dL 9.27  9.24  8.93   Sodium 135 - 145 mmol/L 138  141  137   Potassium 3.5 - 5.1 mmol/L 4.9  3.7  3.1   Chloride 98 - 111 mmol/L 104  106  105   CO2 22 - 32 mmol/L 29  24  21    Calcium 8.9 - 10.3 mg/dL 89.5  89.1  8.8   Total Protein 6.5 - 8.1 g/dL 7.9  8.7  6.2   Total Bilirubin 0.0 - 1.2 mg/dL 0.5  0.5  0.5   Alkaline Phos 38 - 126 U/L 82  122  58   AST 15 - 41 U/L 15  19  24    ALT 0 - 44 U/L 16  14  11      Lab  Results  Component Value Date   WBC 4.8 02/03/2024   HGB 13.2 02/03/2024   HCT 43.2 02/03/2024   MCV 84.2 02/03/2024   PLT 333 02/03/2024   NEUTROABS 2.8 02/03/2024    ASSESSMENT & PLAN:  Malignant neoplasm of lower-outer quadrant of left breast of female, estrogen receptor negative (HCC) 01/09/24: Palpable left breast mass: 1.6 cm 5 o'clock position, axilla negative, biopsy: Grade 2 IDC ER 0%, PR 0%, Ki67 30%, HER2 0   Treatment plan: 02/21/2024: Left lumpectomy: Grade 2 IDC 2.2 cm, margins negative, ER 0%, PR 0%, HER2 negative, Ki67 30%, 0/4 lymph nodes negative Adjuvant chemotherapy with C x 6 cycles Adjuvant radiation therapy  Chemotherapy Counseling: I discussed the risks and benefits of chemotherapy including the risks of nausea/ vomiting, risk of infection from low WBC count, fatigue due to chemo or anemia, bruising or bleeding due to low platelets, mouth sores, loss/ change in taste and decreased appetite. Liver and kidney function will be monitored through out chemotherapy as abnormalities in liver and kidney function may be a side effect of treatment. Risk of permanent bone marrow dysfunction and leukemia due to chemo were also discussed.  Return to clinic in 2 weeks to start chemotherapy We will plan to do chemotherapy without a port.      No orders of the defined types were placed in this encounter.  The patient has a good understanding of the overall plan. she agrees with it. she will call with any problems that may develop before the next visit here. Total time spent: 45 mins including face to face time and time spent for planning, charting and co-ordination of care   Joan K Shishir Krantz, MD 03/08/24

## 2024-03-08 NOTE — Progress Notes (Signed)
START OFF PATHWAY REGIMEN - Breast   [id number]:CMF (Cyclophosphamide IV + Methotrexate IV + Fluorouracil IV) q21 Days:   A cycle is every 21 days:     Cyclophosphamide      Fluorouracil      Methotrexate   **Always confirm dose/schedule in your pharmacy ordering system**  Patient Characteristics: Postoperative without Neoadjuvant Therapy, M0 (Pathologic Staging), Invasive Disease, Adjuvant Therapy, HER2 Negative, ER Negative, Node Negative, pT1a-c, N19mi or pT1c or Higher, pN0 Therapeutic Status: Postoperative without Neoadjuvant Therapy, M0 (Pathologic Staging) AJCC Grade: G2 AJCC N Category: pN0 AJCC M Category: cM0 ER Status: Negative (-) AJCC 8 Stage Grouping: IIA HER2 Status: Negative (-) Oncotype Dx Recurrence Score: Not Appropriate AJCC T Category: pT2 PR Status: Negative (-) Intent of Therapy: Curative Intent, Discussed with Patient

## 2024-03-09 ENCOUNTER — Other Ambulatory Visit: Payer: Self-pay

## 2024-03-12 NOTE — Therapy (Signed)
 OUTPATIENT PHYSICAL THERAPY BREAST CANCER POST OP FOLLOW UP   Patient Name: [name] MRN: [id number] DOB:1946-02-16, 78 y.o., female Today's Date: 03/13/2024  END OF SESSION:  PT End of Session - 03/13/24 1708     Visit Number 2    Number of Visits 10    Date for Recertification  04/10/24    Authorization Type [employer]    Authorization - Visit Number 2    Authorization - Number of Visits 2    PT Start Time 1600    PT Stop Time 1700    PT Time Calculation (min) 60 min    Activity Tolerance Patient tolerated treatment well    Behavior During Therapy WFL for tasks assessed/performed          Past Medical History:  Diagnosis Date   Abnormal laboratory test 03/02/2018   Anemia    due to GI bleed   Arthritis    bilateral knees   Chest pain    Complication of anesthesia    see note about TIA-32 yrs ago   Concussion 05/11/2013   Dizziness    ECHO and Stress test all normal 04/2015   Head pain 07/25/2015   recent blow to head   History of concussion    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   TIA (transient ischemic attack) 1988   occurred three days after anesthesia   Vertigo 08/07/2013   Past Surgical History:  Procedure Laterality Date   APPENDECTOMY     ARTERY BIOPSY Left 03/09/2018   Procedure: BIOPSY LEFT TEMPORAL ARTERY;  Surgeon: Kimble Agent, MD;  Location: [facility];  Service: General;  Laterality: Left;  MAC   BREAST BIOPSY Left 01/09/2024   US  LT BREAST BX W LOC DEV 1ST LESION IMG BX SPEC US  GUIDE 01/09/2024 [facility] MAMMOGRAPHY   BREAST BIOPSY Left 02/17/2024   US  LT RADIOACTIVE SEED LOC 02/17/2024 [facility] MAMMOGRAPHY   BREAST LUMPECTOMY WITH RADIOACTIVE SEED AND SENTINEL LYMPH NODE BIOPSY Left 02/21/2024   Procedure: BREAST LUMPECTOMY WITH RADIOACTIVE SEED AND SENTINEL LYMPH NODE BIOPSY;  Surgeon: [name] Cough, MD;  Location: [facility];  Service: General;  Laterality: Left;  LMA LEFT BREAST SEED GUIDED LUMPECTOMY LEFT AXILLARY SENTINEL  NODE BIOPSY   CERVICAL CONE BIOPSY     COLONOSCOPY     CYSTOCELE REPAIR     DILATATION & CURETTAGE/HYSTEROSCOPY WITH MYOSURE N/A 08/08/2015   Procedure: DILATATION & CURETTAGE/HYSTEROSCOPY WITH MYOSURE;  Surgeon: Dickie Carder, MD;  Location: [facility];  Service: Gynecology;  Laterality: N/A;   TONSILLECTOMY     TUBAL LIGATION     Patient Active Problem List   Diagnosis Date Noted   Malignant neoplasm of lower-outer quadrant of left breast of female, estrogen receptor negative (HCC) 02/01/2024   Elevated blood pressure reading 10/12/2018   History of pericarditis 10/12/2018   Abnormal laboratory test 03/02/2018   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 Cough [name]   REFERRING DIAG: Left Breast Cancer    THERAPY DIAG:  Stiffness of right shoulder, not elsewhere classified  Malignant neoplasm of lower-outer quadrant of left breast of female, estrogen receptor negative (HCC)  Abnormal posture  Aftercare following surgery for neoplasm  Rationale for Evaluation and Treatment: Rehabilitation  ONSET DATE: 01/09/2024  SUBJECTIVE:  SUBJECTIVE STATEMENT: Patient reports she continues to have drainage at the incision sites in the Lt breast. She is having fatigue overall. She is having breast pain and swelling. She has a follow up appointment on Monday with her doctor and believes it is filling up again; she has had her incision site drained twice due to a seroma. She reports there were clear margins with the surgery. She will begin chemo on March 28, 2024. Pt is unable to hold her one year old grandchild. Patient reports her Rt UE feels weaker.   PERTINENT HISTORY:   Patient was diagnosed on 01/09/2024 with left grade 2 IDC. It measures 1.6 cm and is located in the Lower-outer quadrant. It is triple Negative with a Ki67 of 30%. She is s/p a left Lumpectomy and SLNB on 02/21/2024 with 0+/4 LN. She . She had a seroma that was drained twice. She is having 6 cycles of adjuvant chemotherapy with Taxotere and Cytoxan and radiation  PATIENT GOALS:  Reassess how my recovery is going related to arm function, pain, and swelling.  PAIN:  Are you having pain? Yes: NPRS scale: 3-4/10, 8-9/10 at it's worst after surgery. Pain location: Left breast and axilla Pain description: sharp, tingling, achy Aggravating factors: Unable to lift granddaughter, overhead reaching, household chores Relieving factors: Exercises  PRECAUTIONS: Recent Surgery, left UE Lymphedema risk, poly arthralgia being seen by rheumatologist, TIA   RED FLAGS: None   ACTIVITY LEVEL / LEISURE: HEP provided prior to surgery, would like to walk but has not started doing this   OBJECTIVE:   PATIENT SURVEYS:  QUICK DASH: 29.55  OBSERVATIONS: Fibrosis and scar tissue at inferior breast incision and at the axillary incision.   POSTURE:  Forward head, rounded shoulders  LYMPHEDEMA ASSESSMENT:  UPPER EXTREMITY AROM/PROM:   A/PROM RIGHT   eval    Shoulder extension 62  Shoulder flexion 143  Shoulder abduction 175  Shoulder internal rotation 60  Shoulder external rotation 94                          (Blank rows = not tested)   A/PROM LEFT   eval LEFT 03/13/2024  Shoulder extension 64 42  Shoulder flexion 145 138  Shoulder abduction 165 99, cramp  Shoulder internal rotation 60 Held due to UT compensation  Shoulder external rotation 62, pain                           (Blank rows = not tested)   CERVICAL AROM: All within Functionallimits:        UPPER EXTREMITY STRENGTH: WNL   LYMPHEDEMA ASSESSMENTS (in cm):    LANDMARK RIGHT   eval RIGHT 03/13/2024  10 cm proximal to olecranon  process 31.6 33.9  Olecranon process 23.5 25.3   10 cm proximal to ulnar styloid process 17.7 18.7  Just proximal to ulnar styloid process 14.6 14.9  Across hand at thumb web space 18.6 18.9  At base of 2nd digit 5.9 6.3  (Blank rows = not tested)   LANDMARK LEFT   eval LEFT 03/13/2024  10 cm proximal to olecranon process 31.5 32.9  Olecranon process 24.4 25.5  10 cm proximal to ulnar styloid process 18.65 19.1  Just proximal to ulnar styloid process 14.2 14.5  Across hand at thumb web space 18.5 19  At base of 2nd digit 6.2 6.5  (Blank rows = not tested)  Surgery type/Date: 9/16/2025Left  Lumpectomy with SLNB Number of lymph nodes removed: 0/4 Current/past treatment (chemo, radiation, hormone therapy): pending chemo, radiation Other symptoms:  Heaviness/tightness Yes Pain Yes Pitting edema No Infections No Decreased scar mobility Yes Stemmer sign No  PATIENT EDUCATION:  Education details: Reviewed POC, scar massage - temporarily hold until incision is healed, ABC class video, wearing compression bra with foam pad, walking at least 30 minutes a day, continuing exercises from HEP 2-3 times a day to assist with ROM  Person educated: Patient Education method: Explanation and Handouts Education comprehension: verbalized understanding  HOME EXERCISE PROGRAM: Reviewed previously given post op HEP.  ASSESSMENT:  CLINICAL IMPRESSION: Pt is s/p left lumpectomy with SLNB and 0+/4 LN's. She is pending adjuvant chemotherapy and radiation. Objective measurements showed decreased Lt shoulder AROM in all directions with pain reported with abduction. Patient states that she has had her seroma already drained twice and feels as though it is filling again. Mild fibrosis is noted at the incision at the inferior aspect of the breast and the axillar incision. Mild drainage is observed on the paper towel the patient placed over both incisions.   Pt will benefit from skilled therapeutic  intervention to improve on the following deficits: Decreased knowledge of precautions, impaired UE functional use, pain, decreased ROM, postural dysfunction.   PT treatment/interventions: ADL/Self care home management, [phone]- PT Re-evaluation, 97110-Therapeutic exercises, 97530- Therapeutic activity, [id number]- Neuromuscular re-education, 97535- Self Care, and 02859- Manual therapy   GOALS: Goals reviewed with patient? Yes  GOALS MET AT EVAL:  GOALS Name Target Date Goal status  1 Pt will be able to verbalize understanding of pertinent lymphedema risk reduction practices relevant to her dx specifically related to skin care.  Baseline:  No knowledge Eval Achieved at eval  2 Pt will be able to return demo and/or verbalize understanding of the post op HEP related to regaining shoulder ROM. Baseline:  No knowledge Eval Achieved at eval  3 Pt will be able to verbalize understanding of the importance of viewing the post op After Breast CA Class video for further lymphedema risk reduction education and therapeutic exercise.  Baseline:  No knowledge Eval Achieved at eval   LONG TERM GOALS:  (STG=LTG)  GOALS Name Target Date  Goal status  1 Pt will demonstrate she has regained full shoulder ROM and function post operatively compared to baselines.  Baseline: 04/10/24 INITIAL  2 Patient's Quick-DASH will improve to no greater than 20% to demonstrate improved function 04/10/24 INITIAL  3 Patient will watch ABC video and have any questions answered.  04/10/24 INITIAL          PLAN:  PT FREQUENCY/DURATION: 2x/week for 4 weeks  PLAN FOR NEXT SESSION: Improve shoulder abduction ROM   [facility]  [address], [address]  [place] [address]  [phone]  After Breast Cancer Class Video It is recommended you view the ABC class video to be educated on lymphedema risk reduction. This video lasts for about 30 minutes. It can be viewed on our website here:  [email or web address]  Scar massage You can begin gentle scar massage to you incision sites. Gently place one hand on the incision and move the skin (without sliding on the skin) in various directions. Do this for a few minutes and then you can gently massage either coconut oil or vitamin E cream into the scars.  Compression garment You should continue wearing your compression bra until you feel like you no longer have swelling.  Home exercise Program  Continue doing the exercises you were given until you feel like you can do them without feeling any tightness at the end.   Walking Program Studies show that 30 minutes of walking per day (fast enough to elevate your heart rate) can significantly reduce the risk of a cancer recurrence. If you can't walk due to other medical reasons, we encourage you to find another activity you could do (like a stationary bike or water exercise).  Posture After breast cancer surgery, people frequently sit with rounded shoulders posture because it puts their incisions on slack and feels better. If you sit like this and scar tissue forms in that position, you can become very tight and have pain sitting or standing with good posture. Try to be aware of your posture and sit and stand up tall to heal properly.  Follow up PT: It is recommended you return every 3 months for the first 3 years following surgery to be assessed on the SOZO machine for an L-Dex score. This helps prevent clinically significant lymphedema in 95% of patients. These follow up screens are 10 minute appointments that you are not billed for.   Randall Pack, SPT 03/13/2024, 5:14 PM  Re-eval performed by SPT, and observed by self. I have reviewed and concur with this student's documentation.   Grayce JINNY Sheldon, PT 03/13/2024 5:33 PM

## 2024-03-13 ENCOUNTER — Ambulatory Visit: Payer: Self-pay | Attending: General Surgery

## 2024-03-13 DIAGNOSIS — C50512 Malignant neoplasm of lower-outer quadrant of left female breast: Secondary | ICD-10-CM | POA: Insufficient documentation

## 2024-03-13 DIAGNOSIS — Z483 Aftercare following surgery for neoplasm: Secondary | ICD-10-CM | POA: Diagnosis present

## 2024-03-13 DIAGNOSIS — M25611 Stiffness of right shoulder, not elsewhere classified: Secondary | ICD-10-CM | POA: Insufficient documentation

## 2024-03-13 DIAGNOSIS — R293 Abnormal posture: Secondary | ICD-10-CM | POA: Diagnosis present

## 2024-03-13 DIAGNOSIS — Z171 Estrogen receptor negative status [ER-]: Secondary | ICD-10-CM | POA: Insufficient documentation

## 2024-03-13 NOTE — Patient Instructions (Signed)
 [facility]  [address], [address]  [place] [address]  [phone]  After Breast Cancer Class Video It is recommended you view the ABC class video to be educated on lymphedema risk reduction. This video lasts for about 30 minutes. It can be viewed on our website here: [email or web address]  Scar massage You can begin gentle scar massage to you incision sites. Gently place one hand on the incision and move the skin (without sliding on the skin) in various directions. Do this for a few minutes and then you can gently massage either coconut oil or vitamin E cream into the scars.  Compression garment You should continue wearing your compression bra until you feel like you no longer have swelling.  Home exercise Program Continue doing the exercises you were given until you feel like you can do them without feeling any tightness at the end.   Walking Program Studies show that 30 minutes of walking per day (fast enough to elevate your heart rate) can significantly reduce the risk of a cancer recurrence. If you can't walk due to other medical reasons, we encourage you to find another activity you could do (like a stationary bike or water exercise).  Posture After breast cancer surgery, people frequently sit with rounded shoulders posture because it puts their incisions on slack and feels better. If you sit like this and scar tissue forms in that position, you can become very tight and have pain sitting or standing with good posture. Try to be aware of your posture and sit and stand up tall to heal properly.  Follow up PT: It is recommended you return every 3 months for the first 3 years following surgery to be assessed on the SOZO machine for an L-Dex score. This helps prevent clinically significant lymphedema in 95% of patients. These follow up screens are 10 minute appointments that you are not billed  for.

## 2024-03-15 ENCOUNTER — Inpatient Hospital Stay

## 2024-03-19 ENCOUNTER — Telehealth: Payer: Self-pay | Admitting: Pharmacist

## 2024-03-19 ENCOUNTER — Inpatient Hospital Stay: Admitting: Pharmacist

## 2024-03-19 DIAGNOSIS — C50512 Malignant neoplasm of lower-outer quadrant of left female breast: Secondary | ICD-10-CM

## 2024-03-19 DIAGNOSIS — Z5111 Encounter for antineoplastic chemotherapy: Secondary | ICD-10-CM | POA: Diagnosis not present

## 2024-03-19 NOTE — Progress Notes (Signed)
 [facility]       Telephone: [phone]?Fax: [phone]   Oncology Clinical Pharmacist Practitioner Progress Note  [name] is a 78 y.o. female with a diagnosis of breast cancer currently planned to start CMF on 03/28/2024, under the care of Dr. Mackey Chad.   They requested a visit with pharmacy to discuss what supplements are safe to take while on chemotherapy versus what supplements may interfere with the efficacy of their chemotherapy. Patient's sister and daughter were also at the clinic visit for support.   Patient presented with the supplements they take. These included but are not limited to:  Black current seed oil Yellow dock, burdock root, wormwood, red clover (tea) Elderberry, soursop, dandelion, sarsaparilla, verbena (blue vervain), blessed thistle, burdock root (tea) Protein powder with vitamin B, and other antioxidants  Vitamin C Vitamin B Vitamin D Gingko Biloba Iron  Magnesium Glucosamine Coconut water  Chlorophyll Nittokinase protease and serrazimes protease   Counseled patient that they generally should avoid supplements that play a role in reducing inflammation (antioxidants) as this has the potential to interfere with their chemotherapy. Additionally, select supplements reduce the clearance of their chemotherapy which can increase the risk of toxicity. Patient was provided with literature outlining the possible increased risk of breast cancer recurrence when taking certain supplements during chemotherapy.   American Cancer Society article: https://www.cancer.org/cancer/latest-news/study-finds-antioxidants-risky-during-breast-cancer-chemotherapy.html  Supplements/foods patient can continue safety:  Coconut water  Pea protein powder without additional supplements  Magnesium per her PCP recommendations   Patient voiced understanding to limit their use of supplements while on chemotherapy. If they are feeling poorly while on  chemotherapy, they can call the cancer center for medications that are safe to take with their chemotherapy to help alleviate any adverse side effects.   [name], her sister and daughter participated in the discussion, expressed understanding, and voiced agreement with the above plan. All questions were answered to her satisfaction. The patient was advised to contact the clinic at [phone] with any questions or concerns prior to her return visit.  Clinical pharmacy will continue to support [name] and Dr. Vinay [name] as needed.  [name], [facility],  03/19/2024  4:27 PM   **Disclaimer: This note was dictated with voice recognition software. Similar sounding words can inadvertently be transcribed and this note may contain transcription errors which may not have been corrected upon publication of note.**

## 2024-03-19 NOTE — Telephone Encounter (Signed)
 [facility]       Telephone: [phone]?Fax: [phone]   Oncology Clinical Pharmacist Practitioner Progress Note  [name] is a 78 y.o. female with a diagnosis of TNBC who will be starting CMF under the care of Dr. Vinay [name] on 03/28/24.  I connected with [name] today by telephone and verified that I was speaking with the correct person using two patient identifiers. I discussed the limitations, risks, security and privacy concerns of performing an evaluation and management service by telemedicine and the availability of in-person appointments. The patient/caregiver expressed understanding and agreed to proceed.  Other persons participating in the visit and their role in the encounter: N/A   Patient's location: home  Provider's location: clinic  [name] saw the chemo education nurses on 03/15/24 for CMF education and she wanted to have a follow up visit regarding her supplements to ensure they are safe with CMF. Today we explained that with regards to vitamins, minerals, and herbal medications, it is important to realize that these are not tested or regulated by the FDA. Therefore, they have not been studied as extensively as prescription medications, and their safety profile is not as well known. In addition, their purity cannot be guaranteed. Many studies have shown that the ingredients listed on a bottle of an herbal supplement may not actually be contained inside. The [employer] website: VegetableBeverage.com.cy may provide some useful information.     She would prefer to still see clinical pharmacy at 3 pm today and so this has been scheduled and Dr. Odean is aware.   [name] participated in the discussion, expressed understanding, and voiced agreement with the above plan. All questions were answered to her satisfaction. The patient was advised to contact  the clinic at [phone] with any questions or concerns prior to her return visit.  Clinical pharmacy will continue to support [name] and Dr. Vinay [name] as needed.  Schyler Butikofer A. [name], PharmD, BCOP, CPP  [name], RPH-CPP,  03/19/2024  12:13 PM   **Disclaimer: This note was dictated with voice recognition software. Similar sounding words can inadvertently be transcribed and this note may contain transcription errors which may not have been corrected upon publication of note.**

## 2024-03-19 NOTE — Progress Notes (Signed)
   PROVIDER:  DONNICE CARLIN BURY, MD  MRN: [id number] DOB: [birth year] DATE OF ENCOUNTER: 03/19/2024 Interval History:     69 yof s/p lump/sn. She has some pain and some axillary swelling but otherwise fine. 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 Seroma now resolved. Has seen med onc and plans chemotherapy then radiotherapy.    Physical Examination:   Physical Exam   Incisions all healing well, no more seroma   Assessment and Plan:     Doing well, can see back in one year Released to full activity    Return in about 1 year (around 03/19/2025).   The plan was discussed in detail with the patient today, who expressed understanding.  The patient has my contact information, and understands to call me with any additional questions or concerns in the interval.  I would be happy to see the patient back sooner if the need arises.   MATTHEW CARLIN BURY, MD

## 2024-03-20 ENCOUNTER — Encounter: Payer: Self-pay | Admitting: Hematology and Oncology

## 2024-03-20 ENCOUNTER — Ambulatory Visit

## 2024-03-20 DIAGNOSIS — M25611 Stiffness of right shoulder, not elsewhere classified: Secondary | ICD-10-CM

## 2024-03-20 DIAGNOSIS — R293 Abnormal posture: Secondary | ICD-10-CM

## 2024-03-20 DIAGNOSIS — Z483 Aftercare following surgery for neoplasm: Secondary | ICD-10-CM

## 2024-03-20 DIAGNOSIS — C50512 Malignant neoplasm of lower-outer quadrant of left female breast: Secondary | ICD-10-CM

## 2024-03-20 NOTE — Patient Instructions (Signed)
 SHOULDER: Flexion - Supine (Cane)        Cancer Rehab [phone]    Hold cane in both hands. Raise arms up overhead. Do not allow back to arch. Hold _5__ seconds. Do __5__ times; __2__ times a day.  Hands shoulder width apart Hands wider than shoulder width (DV position)   Copyright  VHI. All rights reserved.         After Breast Cancer Class Video It is recommended you view the ABC class video to be educated on lymphedema risk reduction. This video lasts for about 30 minutes. It can be viewed on our website here: [email or web address]  Scar massage You can begin gentle scar massage to you incision sites. Gently place one hand on the incision and move the skin (without sliding on the skin) in various directions. Do this for a few minutes and then you can gently massage either coconut oil or vitamin E cream into the scars.  Compression garment You should continue wearing your compression bra until you feel like you no longer have swelling.  Home exercise Program Continue doing the exercises you were given until you feel like you can do them without feeling any tightness at the end.   Walking Program Studies show that 30 minutes of walking per day (fast enough to elevate your heart rate) can significantly reduce the risk of a cancer recurrence. If you can't walk due to other medical reasons, we encourage you to find another activity you could do (like a stationary bike or water exercise).  Posture After breast cancer surgery, people frequently sit with rounded shoulders posture because it puts their incisions on slack and feels better. If you sit like this and scar tissue forms in that position, you can become very tight and have pain sitting or standing with good posture. Try to be aware of your posture and sit and stand up tall to heal properly.  Follow up PT: It is recommended you return every 3 months for the  first 3 years following surgery to be assessed on the SOZO machine for an L-Dex score. This helps prevent clinically significant lymphedema in 95% of patients. These follow up screens are 10 minute appointments that you are not billed for.

## 2024-03-20 NOTE — Progress Notes (Signed)
 Pharmacist Chemotherapy Monitoring - Initial Assessment    Anticipated start date: 03/28/24   The following has been reviewed per standard work regarding the patient's treatment regimen: The patient's diagnosis, treatment plan and drug doses, and organ/hematologic function Lab orders and baseline tests specific to treatment regimen  The treatment plan start date, drug sequencing, and pre-medications Prior authorization status  Patient's documented medication list, including drug-drug interaction screen and prescriptions for anti-emetics and supportive care specific to the treatment regimen The drug concentrations, fluid compatibility, administration routes, and timing of the medications to be used The patient's access for treatment and lifetime cumulative dose history, if applicable  The patient's medication allergies and previous infusion related reactions, if applicable   Changes made to treatment plan:  N/A  Follow up needed:  Pending authorization for treatment    [name], PharmD, MBA

## 2024-03-20 NOTE — Therapy (Signed)
 OUTPATIENT PHYSICAL THERAPY BREAST CANCER POST OP FOLLOW UP   Patient Name: [name] MRN: [id number] DOB:[birth year], 78 y.o., female Today's Date: 03/20/2024  END OF SESSION:  PT End of Session - 03/20/24 0806     Visit Number 3    Number of Visits 10    Date for Recertification  04/10/24    Authorization Type [employer]    Authorization Time Period 10/8-11/09/2023, 1 re-eval, 8 visits (new auth)    Authorization - Visit Number 1    Authorization - Number of Visits 8    PT Start Time 0808    PT Stop Time 0904    PT Time Calculation (min) 56 min    Activity Tolerance Patient tolerated treatment well    Behavior During Therapy [facility] for tasks assessed/performed          Past Medical History:  Diagnosis Date   Abnormal laboratory test 03/02/2018   Anemia    due to GI bleed   Arthritis    bilateral knees   Chest pain    Complication of anesthesia    see note about TIA-32 yrs ago   Concussion 05/11/2013   Dizziness    ECHO and Stress test all normal 04/2015   Head pain 07/25/2015   recent blow to head   History of concussion    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   TIA (transient ischemic attack) 1988   occurred three days after anesthesia   Vertigo 08/07/2013   Past Surgical History:  Procedure Laterality Date   APPENDECTOMY     ARTERY BIOPSY Left 03/09/2018   Procedure: BIOPSY LEFT TEMPORAL ARTERY;  Surgeon: Kimble Agent, MD;  Location: [facility];  Service: General;  Laterality: Left;  MAC   BREAST BIOPSY Left 01/09/2024   US  LT BREAST BX W LOC DEV 1ST LESION IMG BX SPEC US  GUIDE 01/09/2024 [facility] MAMMOGRAPHY   BREAST BIOPSY Left 02/17/2024   US  LT RADIOACTIVE SEED LOC 02/17/2024 [facility] MAMMOGRAPHY   BREAST LUMPECTOMY WITH RADIOACTIVE SEED AND SENTINEL LYMPH NODE BIOPSY Left 02/21/2024   Procedure: BREAST LUMPECTOMY WITH RADIOACTIVE SEED AND SENTINEL LYMPH NODE BIOPSY;  Surgeon: [name] Cough, MD;  Location: [facility];  Service: General;   Laterality: Left;  LMA LEFT BREAST SEED GUIDED LUMPECTOMY LEFT AXILLARY SENTINEL NODE BIOPSY   CERVICAL CONE BIOPSY     COLONOSCOPY     CYSTOCELE REPAIR     DILATATION & CURETTAGE/HYSTEROSCOPY WITH MYOSURE N/A 08/08/2015   Procedure: DILATATION & CURETTAGE/HYSTEROSCOPY WITH MYOSURE;  Surgeon: Dickie Carder, MD;  Location: [facility];  Service: Gynecology;  Laterality: N/A;   TONSILLECTOMY     TUBAL LIGATION     Patient Active Problem List   Diagnosis Date Noted   Malignant neoplasm of lower-outer quadrant of left breast of female, estrogen receptor negative (HCC) 02/01/2024   Elevated blood pressure reading 10/12/2018   History of pericarditis 10/12/2018   Abnormal laboratory test 03/02/2018   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 Cough [name]   REFERRING DIAG: Left Breast Cancer    THERAPY DIAG:  Stiffness of right shoulder, not elsewhere classified  Malignant neoplasm of lower-outer quadrant of left breast of female, estrogen receptor negative (HCC)  Abnormal posture  Aftercare following surgery for neoplasm  Rationale for Evaluation and Treatment: Rehabilitation  ONSET DATE: 01/09/2024  SUBJECTIVE:  SUBJECTIVE STATEMENT: I saw MD yesterday, and I don't need to see him for a year. I am doing the exercises and I think ROM is doing fine. I don't feel any pain, but I feel an occasional numbness in the back of my shoulder. I start chemo on the 22nd of October.  RE-EVAL Patient reports she continues to have drainage at the incision sites in the Lt breast. She is having fatigue overall. She is having breast pain and swelling. She has a follow up appointment on  Monday with her doctor and believes it is filling up again; she has had her incision site drained twice due to a seroma. She reports there were clear margins with the surgery. She will begin chemo on March 28, 2024. Pt is unable to hold her one year old grandchild. Patient reports her Rt UE feels weaker.   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 It measures 1.6 cm and is located in the Lower-outer quadrant. It is triple Negative with a Ki67 of 30%. She is s/p a left Lumpectomy and SLNB on 02/21/2024 with 0+/4 LN. She . She had a seroma that was drained twice. She is having 6 cycles of adjuvant chemotherapy with Taxotere and Cytoxan and radiation  PATIENT GOALS:  Reassess how my recovery is going related to arm function, pain, and swelling.  PAIN:  Are you having pain? Yes: NPRS scale: 0/10 best  and none in a few days.. Pain location: Left breast and axilla Pain description: sharp, tingling, achy Aggravating factors: Unable to lift granddaughter, overhead reaching, household chores Relieving factors: Exercises  PRECAUTIONS: Recent Surgery, left UE Lymphedema risk, poly arthralgia being seen by rheumatologist, TIA   RED FLAGS: None   ACTIVITY LEVEL / LEISURE: HEP provided prior to surgery, would like to walk but has not started doing this   OBJECTIVE:   PATIENT SURVEYS:  QUICK DASH: 29.55  OBSERVATIONS: Fibrosis and scar tissue at inferior breast incision and at the axillary incision.   POSTURE:  Forward head, rounded shoulders  LYMPHEDEMA ASSESSMENT:  UPPER EXTREMITY AROM/PROM:   A/PROM RIGHT   eval    Shoulder extension 62  Shoulder flexion 143  Shoulder abduction 175  Shoulder internal rotation 60  Shoulder external rotation 94                          (Blank rows = not tested)   A/PROM LEFT   eval LEFT 03/13/2024  Shoulder extension 64 42  Shoulder flexion 145 138  Shoulder abduction 165 99, cramp  Shoulder internal rotation  60 Held due to UT compensation  Shoulder external rotation 62, pain                           (Blank rows = not tested)   CERVICAL AROM: All within Functionallimits:        UPPER EXTREMITY STRENGTH: WNL   LYMPHEDEMA ASSESSMENTS (in cm):    LANDMARK RIGHT   eval RIGHT 03/13/2024  10 cm proximal to olecranon process 31.6 33.9  Olecranon process 23.5 25.3   10 cm proximal to ulnar styloid process 17.7 18.7  Just proximal to ulnar styloid process 14.6 14.9  Across hand at thumb web space 18.6 18.9  At base of 2nd digit 5.9 6.3  (Blank rows = not tested)   LANDMARK LEFT   eval LEFT 03/13/2024  10 cm proximal to  olecranon process 31.5 32.9  Olecranon process 24.4 25.5  10 cm proximal to ulnar styloid process 18.65 19.1  Just proximal to ulnar styloid process 14.2 14.5  Across hand at thumb web space 18.5 19  At base of 2nd digit 6.2 6.5  (Blank rows = not tested)  Surgery type/Date: 9/16/2025Left Lumpectomy with SLNB Number of lymph nodes removed: 0/4 Current/past treatment (chemo, radiation, hormone therapy): pending chemo, radiation Other symptoms:  Heaviness/tightness Yes Pain Yes Pitting edema No Infections No Decreased scar mobility Yes Stemmer sign No   TREATMENT TODAY  03/20/2024  Pulleys x 2 min flex and abd, with VC's and TC's to depress scapulaa Performed scar massage on axillary and breast incisions and Educated pt in scar massage using mirror so she could see and practice. Advised to apply Vit E or coconut oil afterwards. Supine wand flexion and scaption x 5 ea STM to left UT, pectorals and lateral trunk with cocoa butter PROM left shoulder flexion, scaption, abd, IR and ER Updated HEP( wand exs ); printed another copy of post surgery instructions and reminded to watch video, and gave Web site in case link doesn't work so we can discuss, initiate scar massage at home    PATIENT EDUCATION:  Education details: Reviewed POC, scar massage - temporarily  hold until incision is healed, ABC class video, wearing compression bra with foam pad, walking at least 30 minutes a day, continuing exercises from HEP 2-3 times a day to assist with ROM  Person educated: Patient Education method: Explanation and Handouts Education comprehension: verbalized understanding  HOME EXERCISE PROGRAM: Reviewed previously given post op HEP.  ASSESSMENT:  CLINICAL IMPRESSION: 03/20/2024 Pt did exceptionally well today especially with wand exercises, demonstrating very good improvement in ROM. She does require intermittent VC's for scapular depression with pulleys and wand. HEP was updated today and pt was advised to watch video so we can answer any questions.  EVAL Pt is s/p left lumpectomy with SLNB and 0+/4 LN's. She is pending adjuvant chemotherapy and radiation. Objective measurements showed decreased Lt shoulder AROM in all directions with pain reported with abduction. Patient states that she has had her seroma already drained twice and feels as though it is filling again. Mild fibrosis is noted at the incision at the inferior aspect of the breast and the axillar incision. Mild drainage is observed on the paper towel the patient placed over both incisions.   Pt will benefit from skilled therapeutic intervention to improve on the following deficits: Decreased knowledge of precautions, impaired UE functional use, pain, decreased ROM, postural dysfunction.   PT treatment/interventions: ADL/Self care home management, [phone]- PT Re-evaluation, 97110-Therapeutic exercises, 97530- Therapeutic activity, [id number]- Neuromuscular re-education, 97535- Self Care, and 02859- Manual therapy   GOALS: Goals reviewed with patient? Yes  GOALS MET AT EVAL:  GOALS Name Target Date Goal status  1 Pt will be able to verbalize understanding of pertinent lymphedema risk reduction practices relevant to her dx specifically related to skin care.  Baseline:  No knowledge Eval Achieved at  eval  2 Pt will be able to return demo and/or verbalize understanding of the post op HEP related to regaining shoulder ROM. Baseline:  No knowledge Eval Achieved at eval  3 Pt will be able to verbalize understanding of the importance of viewing the post op After Breast CA Class video for further lymphedema risk reduction education and therapeutic exercise.  Baseline:  No knowledge Eval Achieved at eval   LONG TERM GOALS:  (STG=LTG)  GOALS  Name Target Date  Goal status  1 Pt will demonstrate she has regained full shoulder ROM and function post operatively compared to baselines.  Baseline: 04/10/24 INITIAL  2 Patient's Quick-DASH will improve to no greater than 20% to demonstrate improved function 04/10/24 INITIAL  3 Patient will watch ABC video and have any questions answered.  04/10/24 INITIAL          PLAN:  PT FREQUENCY/DURATION: 2x/week for 4 weeks  PLAN FOR NEXT SESSION: Did she watch video, any trouble with wand exs,? add pec stretches;snow angels,pec wall stretch, lat stretch, measure ROM   [facility]  [address], [address]  [place] [address]  [phone]   SHOULDER: Flexion - Supine (Cane)        Cancer Rehab [phone]    Hold cane in both hands. Raise arms up overhead. Do not allow back to arch. Hold _5__ seconds. Do __5__ times; __2__ times a day.  Hands shoulder width apart Hands wider than shoulder width (DV position)   Copyright  VHI. All rights reserved.         After Breast Cancer Class Video It is recommended you view the ABC class video to be educated on lymphedema risk reduction. This video lasts for about 30 minutes. It can be viewed on our website here: [email or web address]  Scar massage You can begin gentle scar massage to you incision sites. Gently place one hand on the incision and move the skin (without sliding on the skin) in various directions. Do  this for a few minutes and then you can gently massage either coconut oil or vitamin E cream into the scars.  Compression garment You should continue wearing your compression bra until you feel like you no longer have swelling.  Home exercise Program Continue doing the exercises you were given until you feel like you can do them without feeling any tightness at the end.   Walking Program Studies show that 30 minutes of walking per day (fast enough to elevate your heart rate) can significantly reduce the risk of a cancer recurrence. If you can't walk due to other medical reasons, we encourage you to find another activity you could do (like a stationary bike or water exercise).  Posture After breast cancer surgery, people frequently sit with rounded shoulders posture because it puts their incisions on slack and feels better. If you sit like this and scar tissue forms in that position, you can become very tight and have pain sitting or standing with good posture. Try to be aware of your posture and sit and stand up tall to heal properly.  Follow up PT: It is recommended you return every 3 months for the first 3 years following surgery to be assessed on the SOZO machine for an L-Dex score. This helps prevent clinically significant lymphedema in 95% of patients. These follow up screens are 10 minute appointments that you are not billed for.   Randall Pack, SPT 03/20/2024, 10:33 AM  Re-eval performed by SPT, and observed by self. I have reviewed and concur with this student's documentation.   Grayce JINNY Sheldon, PT 03/20/2024 10:33 AM

## 2024-03-21 ENCOUNTER — Encounter: Payer: Self-pay | Admitting: Hematology and Oncology

## 2024-03-21 ENCOUNTER — Telehealth: Payer: Self-pay | Admitting: *Deleted

## 2024-03-21 NOTE — Telephone Encounter (Signed)
 Completed FMLA for patient's son/  Provider reviewed, signed, returned to this nurse.  Returned via fax and email attn [employer], [phone]  Returned form to [employer] earlier today.  Copies to [facility] HIM bin for items to be scanned.  Mailed to address on file.  [address] [place] [address] completes process.  No further instructions received, actions performed or required by this nurse.

## 2024-03-21 NOTE — Telephone Encounter (Signed)
 On 03/20/2024 this nurse completed Miles Freel daughter's FMLA paperwork   Sent to provider to review, amend, sign and return to this nurse to return to claims benefit manager. Today received signed form ready to return to [facility].  [employer].  Other form staff notified of Signed ROI with second form for son received and signed 03/19/2024.  SABRA

## 2024-03-22 ENCOUNTER — Telehealth: Payer: Self-pay | Admitting: Pharmacist

## 2024-03-22 ENCOUNTER — Ambulatory Visit

## 2024-03-22 DIAGNOSIS — Z483 Aftercare following surgery for neoplasm: Secondary | ICD-10-CM

## 2024-03-22 DIAGNOSIS — Z171 Estrogen receptor negative status [ER-]: Secondary | ICD-10-CM

## 2024-03-22 DIAGNOSIS — M25611 Stiffness of right shoulder, not elsewhere classified: Secondary | ICD-10-CM

## 2024-03-22 DIAGNOSIS — R293 Abnormal posture: Secondary | ICD-10-CM

## 2024-03-22 NOTE — Telephone Encounter (Signed)
 [facility]       Telephone: [phone]?Fax: [phone]   Oncology Clinical Pharmacist Practitioner Progress Note  [name] is a 78 y.o. female with a diagnosis of TNBC currently starting CMF on 03/28/24 under the care of Dr. Mackey Chad.   I connected with [name] today by telephone and verified that I was speaking with the correct person using two patient identifiers. I discussed the limitations, risks, security and privacy concerns of performing an evaluation and management service by telemedicine and the availability of in-person appointments. The patient/caregiver expressed understanding and agreed to proceed.  Other persons participating in the visit and their role in the encounter: self   Patient's location: home  Provider's location: clinic  [name] contacted clinical pharmacy to inquire about possible drug-drug interactions with two ophthalmic eye drops that she did not mention at our visit earlier this week. We added these to her medication list and discussed they should be reasonable to take and not interact with her current CMF treatment regimen.  [name] participated in the discussion, expressed understanding, and voiced agreement with the above plan. All questions were answered to her satisfaction. The patient was advised to contact the clinic at [phone] with any questions or concerns prior to her return visit.  Clinical pharmacy will continue to support [name] and Dr. Vinay [name] as needed.  [name], PharmD, BCOP, CPP  [name], [name],  03/22/2024  8:49 AM   **Disclaimer: This note was dictated with voice recognition software. Similar sounding words can inadvertently be transcribed and this note may contain transcription errors which may not have been corrected upon publication of note.**

## 2024-03-22 NOTE — Therapy (Signed)
 OUTPATIENT PHYSICAL THERAPY BREAST CANCER POST OP FOLLOW UP   Patient Name: [name] MRN: [id number] DOB:[birth year], 78 y.o., female Today's Date: 03/22/2024  END OF SESSION:  PT End of Session - 03/22/24 1406     Visit Number 4    Number of Visits 10    Date for Recertification  04/10/24    Authorization Type [employer]    Authorization Time Period 10/8-11/09/2023, 1 re-eval, 8 visits (new auth)    Authorization - Visit Number 2    Authorization - Number of Visits 8    PT Start Time 1406    PT Stop Time 1459    PT Time Calculation (min) 53 min    Activity Tolerance Patient tolerated treatment well    Behavior During Therapy WFL for tasks assessed/performed          Past Medical History:  Diagnosis Date   Abnormal laboratory test 03/02/2018   Anemia    due to GI bleed   Arthritis    bilateral knees   Chest pain    Complication of anesthesia    see note about TIA-32 yrs ago   Concussion 05/11/2013   Dizziness    ECHO and Stress test all normal 04/2015   Head pain 07/25/2015   recent blow to head   History of concussion    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   TIA (transient ischemic attack) 1988   occurred three days after anesthesia   Vertigo 08/07/2013   Past Surgical History:  Procedure Laterality Date   APPENDECTOMY     ARTERY BIOPSY Left 03/09/2018   Procedure: BIOPSY LEFT TEMPORAL ARTERY;  Surgeon: Kimble Agent, MD;  Location: [facility];  Service: General;  Laterality: Left;  MAC   BREAST BIOPSY Left 01/09/2024   US  LT BREAST BX W LOC DEV 1ST LESION IMG BX SPEC US  GUIDE 01/09/2024 [facility]   BREAST BIOPSY Left 02/17/2024   US  LT RADIOACTIVE SEED LOC 02/17/2024 [facility]   BREAST LUMPECTOMY WITH RADIOACTIVE SEED AND SENTINEL LYMPH NODE BIOPSY Left 02/21/2024   Procedure: BREAST LUMPECTOMY WITH RADIOACTIVE SEED AND SENTINEL LYMPH NODE BIOPSY;  Surgeon: [name] Cough, MD;  Location:  SURGERY CENTER;  Service: General;   Laterality: Left;  LMA LEFT BREAST SEED GUIDED LUMPECTOMY LEFT AXILLARY SENTINEL NODE BIOPSY   CERVICAL CONE BIOPSY     COLONOSCOPY     CYSTOCELE REPAIR     DILATATION & CURETTAGE/HYSTEROSCOPY WITH MYOSURE N/A 08/08/2015   Procedure: DILATATION & CURETTAGE/HYSTEROSCOPY WITH MYOSURE;  Surgeon: Dickie Carder, MD;  Location: [facility];  Service: Gynecology;  Laterality: N/A;   TONSILLECTOMY     TUBAL LIGATION     Patient Active Problem List   Diagnosis Date Noted   Malignant neoplasm of lower-outer quadrant of left breast of female, estrogen receptor negative (HCC) 02/01/2024   Elevated blood pressure reading 10/12/2018   History of pericarditis 10/12/2018   Abnormal laboratory test 03/02/2018   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 Cough [name]   REFERRING DIAG: Left Breast Cancer    THERAPY DIAG:  Stiffness of right shoulder, not elsewhere classified  Malignant neoplasm of lower-outer quadrant of left breast of female, estrogen receptor negative (HCC)  Abnormal posture  Aftercare following surgery for neoplasm  Rationale for Evaluation and Treatment: Rehabilitation  ONSET DATE: 01/09/2024  SUBJECTIVE:  SUBJECTIVE STATEMENT: I watched the video and there is so much information.  I start chemo on Wednesday. I wasn't sore after last visit. I am wearing the compression bra and I can get it on myself. I sometimes have a burning sensation/tingling in my left back 3/10 present, but sometimes its not there.  RE-EVAL Patient reports she continues to have drainage at the incision sites in the Lt breast. She is having fatigue overall. She is having breast pain and  swelling. She has a follow up appointment on Monday with her doctor and believes it is filling up again; she has had her incision site drained twice due to a seroma. She reports there were clear margins with the surgery. She will begin chemo on March 28, 2024. Pt is unable to hold her one year old grandchild. Patient reports her Rt UE feels weaker.   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 It measures 1.6 cm and is located in the Lower-outer quadrant. It is triple Negative with a Ki67 of 30%. She is s/p a left Lumpectomy and SLNB on 02/21/2024 with 0+/4 LN. She . She had a seroma that was drained twice. She is having 6 cycles of adjuvant chemotherapy with Taxotere and Cytoxan and radiation  PATIENT GOALS:  Reassess how my recovery is going related to arm function, pain, and swelling.  PAIN:  Are you having pain? Yes: NPRS scale: 2-3/10. Pain location: left back Pain description:  tingling, achy Aggravating factors: Unable to lift granddaughter, overhead reaching, household chores Relieving factors: Exercises  PRECAUTIONS: Recent Surgery, left UE Lymphedema risk, poly arthralgia being seen by rheumatologist, TIA   RED FLAGS: None   ACTIVITY LEVEL / LEISURE: HEP provided prior to surgery, would like to walk but has not started doing this   OBJECTIVE:   PATIENT SURVEYS:  QUICK DASH: 29.55  OBSERVATIONS: Fibrosis and scar tissue at inferior breast incision and at the axillary incision.   POSTURE:  Forward head, rounded shoulders  LYMPHEDEMA ASSESSMENT:  UPPER EXTREMITY AROM/PROM:   A/PROM RIGHT   eval    Shoulder extension 62  Shoulder flexion 143  Shoulder abduction 175  Shoulder internal rotation 60  Shoulder external rotation 94                          (Blank rows = not tested)   A/PROM LEFT   eval LEFT 03/13/2024  Shoulder extension 64 42  Shoulder flexion 145 138  Shoulder abduction 165 99, cramp  Shoulder internal rotation  60 Held due to UT compensation  Shoulder external rotation 62, pain                           (Blank rows = not tested)   CERVICAL AROM: All within Functionallimits:        UPPER EXTREMITY STRENGTH: WNL   LYMPHEDEMA ASSESSMENTS (in cm):    LANDMARK RIGHT   eval RIGHT 03/13/2024  10 cm proximal to olecranon process 31.6 33.9  Olecranon process 23.5 25.3   10 cm proximal to ulnar styloid process 17.7 18.7  Just proximal to ulnar styloid process 14.6 14.9  Across hand at thumb web space 18.6 18.9  At base of 2nd digit 5.9 6.3  (Blank rows = not tested)   LANDMARK LEFT   eval LEFT 03/13/2024  10 cm proximal to olecranon process 31.5 32.9  Olecranon process 24.4 25.5  10 cm proximal to ulnar styloid process 18.65 19.1  Just proximal to ulnar styloid process 14.2 14.5  Across hand at thumb web space 18.5 19  At base of 2nd digit 6.2 6.5  (Blank rows = not tested)  Surgery type/Date: 9/16/2025Left Lumpectomy with SLNB Number of lymph nodes removed: 0/4 Current/past treatment (chemo, radiation, hormone therapy): pending chemo, radiation Other symptoms:  Heaviness/tightness Yes Pain Yes Pitting edema No Infections No Decreased scar mobility Yes Stemmer sign No   TREATMENT TODAY  03/22/2024 Discussed progress, answered questions about scar massage Overhead pulleys 2 min ea. Flex and abduction x 2 min ea, VC and TC to depress scapula. Towel roll behind back Supine wand flex and scaption x 5 ea, 5 sec ea Supine AROM bilateral shoulder flexion, scaption, horizontal abduction x 5, snow angels sx 5 PROM left shoulder flex, scaption, abd, ER, IR with VC's to relax Discussed radiation;continuing exercises,wearing compression as long as possible Checked pts left back;no sign of swelling, redness etc.  03/20/2024  Pulleys x 2 min flex and abd, with VC's and TC's to depress scapulaa Performed scar massage on axillary and breast incisions and Educated pt in scar massage using  mirror so she could see and practice. Advised to apply Vit E or coconut oil afterwards. Supine wand flexion and scaption x 5 ea STM to left UT, pectorals and lateral trunk with cocoa butter PROM left shoulder flexion, scaption, abd, IR and ER Updated HEP( wand exs ); printed another copy of post surgery instructions and reminded to watch video, and gave Web site in case link doesn't work so we can discuss, initiate scar massage at home    PATIENT EDUCATION:  Education details: Reviewed POC, scar massage - temporarily hold until incision is healed, ABC class video, wearing compression bra with foam pad, walking at least 30 minutes a day, continuing exercises from HEP 2-3 times a day to assist with ROM  Person educated: Patient Education method: Explanation and Handouts Education comprehension: verbalized understanding  HOME EXERCISE PROGRAM: Reviewed previously given post op HEP. Added supine wand exercises flex and scaption x 5 ea in place of clasped hands 3 D AROM flex, scapt, horizontal abd, snow angels (written down)  ASSESSMENT:  CLINICAL IMPRESSION: Reviewed wand exercises and they have been helpful for pts ROM. She has watched half of the ABC video and has taken 5 pages of notes. Will come with questions next time.  EVAL Pt is s/p left lumpectomy with SLNB and 0+/4 LN's. She is pending adjuvant chemotherapy and radiation. Objective measurements showed decreased Lt shoulder AROM in all directions with pain reported with abduction. Patient states that she has had her seroma already drained twice and feels as though it is filling again. Mild fibrosis is noted at the incision at the inferior aspect of the breast and the axillar incision. Mild drainage is observed on the paper towel the patient placed over both incisions.   Pt will benefit from skilled therapeutic intervention to improve on the following deficits: Decreased knowledge of precautions, impaired UE functional use, pain,  decreased ROM, postural dysfunction.   PT treatment/interventions: ADL/Self care home management, [phone]- PT Re-evaluation, 97110-Therapeutic exercises, 97530- Therapeutic activity, [id number]- Neuromuscular re-education, 97535- Self Care, and 02859- Manual therapy   GOALS: Goals reviewed with patient? Yes  GOALS MET AT EVAL:  GOALS Name Target Date Goal status  1 Pt will be able to verbalize understanding of pertinent lymphedema risk reduction practices relevant to her dx specifically related to skin care.  Baseline:  No knowledge Eval Achieved at eval  2 Pt will be able to return demo and/or verbalize understanding of the post op HEP related to regaining shoulder ROM. Baseline:  No knowledge Eval Achieved at eval  3 Pt will be able to verbalize understanding of the importance of viewing the post op After Breast CA Class video for further lymphedema risk reduction education and therapeutic exercise.  Baseline:  No knowledge Eval Achieved at eval   LONG TERM GOALS:  (STG=LTG)  GOALS Name Target Date  Goal status  1 Pt will demonstrate she has regained full shoulder ROM and function post operatively compared to baselines.  Baseline: 04/10/24 INITIAL  2 Patient's Quick-DASH will improve to no greater than 20% to demonstrate improved function 04/10/24 INITIAL  3 Patient will watch ABC video and have any questions answered.  04/10/24 INITIAL          PLAN:  PT FREQUENCY/DURATION: 2x/week for 4 weeks  PLAN FOR NEXT SESSION: Measure, check goals,Did she watch video, any trouble with wand exs,? add pec stretches;snow angels,pec wall stretch, lat stretch, measure ROM   [facility]  [address], [address]  [place] [address]  [phone]   SHOULDER: Flexion - Supine (Cane)        Cancer Rehab [phone]    Hold cane in both hands. Raise arms up overhead. Do not allow back to arch. Hold _5__ seconds. Do __5__ times; __2__ times a day.  Hands shoulder width  apart Hands wider than shoulder width (DV position)   Copyright  VHI. All rights reserved.         After Breast Cancer Class Video It is recommended you view the ABC class video to be educated on lymphedema risk reduction. This video lasts for about 30 minutes. It can be viewed on our website here: [email or web address]  Scar massage You can begin gentle scar massage to you incision sites. Gently place one hand on the incision and move the skin (without sliding on the skin) in various directions. Do this for a few minutes and then you can gently massage either coconut oil or vitamin E cream into the scars.  Compression garment You should continue wearing your compression bra until you feel like you no longer have swelling.  Home exercise Program Continue doing the exercises you were given until you feel like you can do them without feeling any tightness at the end.   Walking Program Studies show that 30 minutes of walking per day (fast enough to elevate your heart rate) can significantly reduce the risk of a cancer recurrence. If you can't walk due to other medical reasons, we encourage you to find another activity you could do (like a stationary bike or water exercise).  Posture After breast cancer surgery, people frequently sit with rounded shoulders posture because it puts their incisions on slack and feels better. If you sit like this and scar tissue forms in that position, you can become very tight and have pain sitting or standing with good posture. Try to be aware of your posture and sit and stand up tall to heal properly.  Follow up PT: It is recommended you return every 3 months for the first 3 years following surgery to be assessed on the SOZO machine for an L-Dex score. This helps prevent clinically significant lymphedema in 95% of patients. These follow up screens are 10 minute appointments that you are not  billed for.   JRobin J Tyanna Hach, PT 03/22/2024  4:30 PM

## 2024-03-26 ENCOUNTER — Ambulatory Visit

## 2024-03-26 DIAGNOSIS — M25611 Stiffness of right shoulder, not elsewhere classified: Secondary | ICD-10-CM | POA: Diagnosis not present

## 2024-03-26 DIAGNOSIS — R293 Abnormal posture: Secondary | ICD-10-CM

## 2024-03-26 DIAGNOSIS — Z483 Aftercare following surgery for neoplasm: Secondary | ICD-10-CM

## 2024-03-26 DIAGNOSIS — Z171 Estrogen receptor negative status [ER-]: Secondary | ICD-10-CM

## 2024-03-26 NOTE — Therapy (Signed)
 OUTPATIENT PHYSICAL THERAPY BREAST CANCER POST OP FOLLOW UP   Patient Name: [name] MRN: [id number] DOB:[birth year], 78 y.o., female Today's Date: 03/26/2024  END OF SESSION:  PT End of Session - 03/26/24 1204     Visit Number 5    Number of Visits 10    Date for Recertification  04/10/24    Authorization Type [employer]    Authorization Time Period 10/8-11/09/2023, 1 re-eval, 8 visits (new auth)    Authorization - Visit Number 3    Authorization - Number of Visits 8    PT Start Time 1204    PT Stop Time 1259    PT Time Calculation (min) 55 min    Activity Tolerance Patient tolerated treatment well    Behavior During Therapy WFL for tasks assessed/performed          Past Medical History:  Diagnosis Date   Abnormal laboratory test 03/02/2018   Anemia    due to GI bleed   Arthritis    bilateral knees   Chest pain    Complication of anesthesia    see note about TIA-32 yrs ago   Concussion 05/11/2013   Dizziness    ECHO and Stress test all normal 04/2015   Head pain 07/25/2015   recent blow to head   History of concussion    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   TIA (transient ischemic attack) 1988   occurred three days after anesthesia   Vertigo 08/07/2013   Past Surgical History:  Procedure Laterality Date   APPENDECTOMY     ARTERY BIOPSY Left 03/09/2018   Procedure: BIOPSY LEFT TEMPORAL ARTERY;  Surgeon: Kimble Agent, MD;  Location: [facility];  Service: General;  Laterality: Left;  MAC   BREAST BIOPSY Left 01/09/2024   US  LT BREAST BX W LOC DEV 1ST LESION IMG BX SPEC US  GUIDE 01/09/2024 [facility]   BREAST BIOPSY Left 02/17/2024   US  LT RADIOACTIVE SEED LOC 02/17/2024 [facility]MO[facility]   BREAST LUMPECTOMY WITH RADIOACTIVE SEED AND SENTINEL LYMPH NODE BIOPSY Left 02/21/2024   Procedure: BREAST LUMPECTOMY WITH RADIOACTIVE SEED AND SENTINEL LYMPH NODE BIOPSY;  Surgeon: [name] Cough, MD;  Location: [facility];  Service: General;   Laterality: Left;  LMA LEFT BREAST SEED GUIDED LUMPECTOMY LEFT AXILLARY SENTINEL NODE BIOPSY   CERVICAL CONE BIOPSY     COLONOSCOPY     CYSTOCELE REPAIR     DILATATION & CURETTAGE/HYSTEROSCOPY WITH MYOSURE N/A 08/08/2015   Procedure: DILATATION & CURETTAGE/HYSTEROSCOPY WITH MYOSURE;  Surgeon: Dickie Carder, MD;  Location: [facility];  Service: Gynecology;  Laterality: N/A;   TONSILLECTOMY     TUBAL LIGATION     Patient Active Problem List   Diagnosis Date Noted   Malignant neoplasm of lower-outer quadrant of left breast of female, estrogen receptor negative (HCC) 02/01/2024   Elevated blood pressure reading 10/12/2018   History of pericarditis 10/12/2018   Abnormal laboratory test 03/02/2018   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 Cough [name]   REFERRING DIAG: Left Breast Cancer    THERAPY DIAG:  Stiffness of right shoulder, not elsewhere classified  Malignant neoplasm of lower-outer quadrant of left breast of female, estrogen receptor negative (HCC)  Abnormal posture  Aftercare following surgery for neoplasm  Rationale for Evaluation and Treatment: Rehabilitation  ONSET DATE: 01/09/2024  SUBJECTIVE:  SUBJECTIVE STATEMENT: Chemo starts day after tomorrow. I finished the video and My daughter watched it too. My right shoulder is hurting more than the left. I think my left shoulder ROM is better than the right.  RE-EVAL Patient reports she continues to have drainage at the incision sites in the Lt breast. She is having fatigue overall. She is having breast pain and swelling. She has a follow up appointment on Monday with her doctor and believes it is filling up  again; she has had her incision site drained twice due to a seroma. She reports there were clear margins with the surgery. She will begin chemo on March 28, 2024. Pt is unable to hold her one year old grandchild. Patient reports her Rt UE feels weaker.   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 It measures 1.6 cm and is located in the Lower-outer quadrant. It is triple Negative with a Ki67 of 30%. She is s/p a left Lumpectomy and SLNB on 02/21/2024 with 0+/4 LN. She . She had a seroma that was drained twice. She is having 6 cycles of adjuvant chemotherapy with Taxotere and Cytoxan and radiation  PATIENT GOALS:  Reassess how my recovery is going related to arm function, pain, and swelling.  PAIN:  Are you having pain? Yes: NPRS scale: 1-2/10 left, Right 7/10. Pain location: left Pain description:  tingling, achy Aggravating factors: Unable to lift granddaughter, overhead reaching, household chores Relieving factors: Exercises  PRECAUTIONS: Recent Surgery, left UE Lymphedema risk, poly arthralgia being seen by rheumatologist, TIA   RED FLAGS: None   ACTIVITY LEVEL / LEISURE: HEP provided prior to surgery, would like to walk but has not started doing this   OBJECTIVE:   PATIENT SURVEYS:  QUICK DASH: 29.55  OBSERVATIONS: Fibrosis and scar tissue at inferior breast incision and at the axillary incision.   POSTURE:  Forward head, rounded shoulders  LYMPHEDEMA ASSESSMENT:  UPPER EXTREMITY AROM/PROM:   A/PROM RIGHT   eval    Shoulder extension 62  Shoulder flexion 143  Shoulder abduction 175  Shoulder internal rotation 60  Shoulder external rotation 94                          (Blank rows = not tested)   A/PROM LEFT   eval LEFT 03/13/2024 LEFT 03/26/2024  Shoulder extension 64 42 65  Shoulder flexion 145 138 145  Shoulder abduction 165 99, cramp 155  Shoulder internal rotation 60 Held due to UT compensation   Shoulder external  rotation 62, pain  85 pain                          (Blank rows = not tested)   CERVICAL AROM: All within Functionallimits:        UPPER EXTREMITY STRENGTH: WNL   LYMPHEDEMA ASSESSMENTS (in cm):    LANDMARK RIGHT   eval RIGHT 03/13/2024  10 cm proximal to olecranon process 31.6 33.9  Olecranon process 23.5 25.3   10 cm proximal to ulnar styloid process 17.7 18.7  Just proximal to ulnar styloid process 14.6 14.9  Across hand at thumb web space 18.6 18.9  At base of 2nd digit 5.9 6.3  (Blank rows = not tested)   LANDMARK LEFT   eval LEFT 03/13/2024  10 cm proximal to olecranon process 31.5 32.9  Olecranon process 24.4 25.5  10 cm proximal to ulnar styloid  process 18.65 19.1  Just proximal to ulnar styloid process 14.2 14.5  Across hand at thumb web space 18.5 19  At base of 2nd digit 6.2 6.5  (Blank rows = not tested)  Surgery type/Date: 9/16/2025Left Lumpectomy with SLNB Number of lymph nodes removed: 0/4 Current/past treatment (chemo, radiation, hormone therapy): pending chemo, radiation Other symptoms:  Heaviness/tightness Yes Pain Yes Pitting edema No Infections No Decreased scar mobility Yes Stemmer sign No   TREATMENT TODAY  03/26/2024 Pulleys flexion x 2 min, scaption tried  but held on left due to pain. STM to left UT, pecs, lateral trunk with cocoa butter Supine AROM left shoulder flexion, scaption, horizontal abduction x 5, snow angels sx 5( held on right due to flare of right shoulder pain. PROM left shoulder flex, scaption, abd, ER, IR with VC's to relax Answered pt questions about video Advised pt to be cautious with compensation, and worked on actively depressing scapula with exs in supine with good results. Discussed use of gel pack in toweling on right for right shoulder pain 03/22/2024 Discussed progress, answered questions about scar massage Overhead pulleys 2 min ea. Flex and abduction x 2 min ea, VC and TC to depress scapula. Towel roll  behind back Supine wand flex and scaption x 5 ea, 5 sec ea Supine AROM bilateral shoulder flexion, scaption, horizontal abduction x 5, snow angels sx 5 PROM left shoulder flex, scaption, abd, ER, IR with VC's to relax Discussed radiation;continuing exercises,wearing compression as long as possible Checked pts left back;no sign of swelling, redness etc.  03/20/2024  Pulleys x 2 min flex and abd, with VC's and TC's to depress scapulaa Performed scar massage on axillary and breast incisions and Educated pt in scar massage using mirror so she could see and practice. Advised to apply Vit E or coconut oil afterwards. Supine wand flexion and scaption x 5 ea STM to left UT, pectorals and lateral trunk with cocoa butter PROM left shoulder flexion, scaption, abd, IR and ER Updated HEP( wand exs ); printed another copy of post surgery instructions and reminded to watch video, and gave Web site in case link doesn't work so we can discuss, initiate scar massage at home    PATIENT EDUCATION:  Education details: Reviewed POC, scar massage - temporarily hold until incision is healed, ABC class video, wearing compression bra with foam pad, walking at least 30 minutes a day, continuing exercises from HEP 2-3 times a day to assist with ROM  Person educated: Patient Education method: Explanation and Handouts Education comprehension: verbalized understanding  HOME EXERCISE PROGRAM: Reviewed previously given post op HEP. Added supine wand exercises flex and scaption x 5 ea in place of clasped hands 3 D AROM flex, scapt, horizontal abd, snow angels (written down)  ASSESSMENT:  CLINICAL IMPRESSION: Pts right shoulder flared up, likely from compensating during exercises. Will work on AROM on left only and advised to ice right shoulder with gel pack and toweling x 10-15 min. Excellent improvement in left shoulder AROM. Tightness noted left pectorals with STM. Pt requires VC and TC's to prevent UT  compensation.  EVAL Pt is s/p left lumpectomy with SLNB and 0+/4 LN's. She is pending adjuvant chemotherapy and radiation. Objective measurements showed decreased Lt shoulder AROM in all directions with pain reported with abduction. Patient states that she has had her seroma already drained twice and feels as though it is filling again. Mild fibrosis is noted at the incision at the inferior aspect of the breast and the  axillar incision. Mild drainage is observed on the paper towel the patient placed over both incisions.   Pt will benefit from skilled therapeutic intervention to improve on the following deficits: Decreased knowledge of precautions, impaired UE functional use, pain, decreased ROM, postural dysfunction.   PT treatment/interventions: ADL/Self care home management, [phone]- PT Re-evaluation, 97110-Therapeutic exercises, 97530- Therapeutic activity, [id number]- Neuromuscular re-education, 97535- Self Care, and [id number]- Manual therapy   GOALS: Goals reviewed with patient? Yes  GOALS MET AT EVAL:  GOALS Name Target Date Goal status  1 Pt will be able to verbalize understanding of pertinent lymphedema risk reduction practices relevant to her dx specifically related to skin care.  Baseline:  No knowledge Eval Achieved at eval  2 Pt will be able to return demo and/or verbalize understanding of the post op HEP related to regaining shoulder ROM. Baseline:  No knowledge Eval Achieved at eval  3 Pt will be able to verbalize understanding of the importance of viewing the post op After Breast CA Class video for further lymphedema risk reduction education and therapeutic exercise.  Baseline:  No knowledge Eval Achieved at eval   LONG TERM GOALS:  (STG=LTG)  GOALS Name Target Date  Goal status  1 Pt will demonstrate she has regained full shoulder ROM and function post operatively compared to baselines.  Baseline: 04/10/24 INITIAL  2 Patient's Quick-DASH will improve to no greater than 20% to  demonstrate improved function 04/10/24 INITIAL  3 Patient will watch ABC video and have any questions answered.  04/10/24 03/26/2024          PLAN:  PT FREQUENCY/DURATION: 2x/week for 4 weeks  PLAN FOR NEXT SESSION: Measure, check goals,Did she watch video, any trouble with wand exs,? add pec stretches;snow angels,pec wall stretch, lat stretch, measure ROM   [facility]  [address], [address]  [place] [address]  [phone]   SHOULDER: Flexion - Supine (Cane)        [facility] [phone]    Hold cane in both hands. Raise arms up overhead. Do not allow back to arch. Hold _5__ seconds. Do __5__ times; __2__ times a day.  Hands shoulder width apart Hands wider than shoulder width (DV position)   Copyright  VHI. All rights reserved.         After Breast Cancer Class Video It is recommended you view the ABC class video to be educated on lymphedema risk reduction. This video lasts for about 30 minutes. It can be viewed on our website here: [email or web address]  Scar massage You can begin gentle scar massage to you incision sites. Gently place one hand on the incision and move the skin (without sliding on the skin) in various directions. Do this for a few minutes and then you can gently massage either coconut oil or vitamin E cream into the scars.  Compression garment You should continue wearing your compression bra until you feel like you no longer have swelling.  Home exercise Program Continue doing the exercises you were given until you feel like you can do them without feeling any tightness at the end.   Walking Program Studies show that 30 minutes of walking per day (fast enough to elevate your heart rate) can significantly reduce the risk of a cancer recurrence. If you can't walk due to other medical reasons, we encourage you to find another activity you could do (like a  stationary bike or water exercise).  Posture After breast cancer surgery, people frequently sit with rounded  shoulders posture because it puts their incisions on slack and feels better. If you sit like this and scar tissue forms in that position, you can become very tight and have pain sitting or standing with good posture. Try to be aware of your posture and sit and stand up tall to heal properly.  Follow up PT: It is recommended you return every 3 months for the first 3 years following surgery to be assessed on the SOZO machine for an L-Dex score. This helps prevent clinically significant lymphedema in 95% of patients. These follow up screens are 10 minute appointments that you are not billed for.   JRobin J Kiele Heavrin, PT 03/26/2024 1:03 PM

## 2024-03-28 ENCOUNTER — Inpatient Hospital Stay

## 2024-03-28 ENCOUNTER — Other Ambulatory Visit: Payer: Self-pay | Admitting: *Deleted

## 2024-03-28 ENCOUNTER — Inpatient Hospital Stay (HOSPITAL_BASED_OUTPATIENT_CLINIC_OR_DEPARTMENT_OTHER): Admitting: Hematology and Oncology

## 2024-03-28 VITALS — BP 140/84 | HR 81 | Temp 97.6°F | Resp 18 | Ht 66.0 in | Wt 157.1 lb

## 2024-03-28 VITALS — BP 116/83 | HR 75 | Temp 97.5°F | Resp 18

## 2024-03-28 DIAGNOSIS — Z171 Estrogen receptor negative status [ER-]: Secondary | ICD-10-CM | POA: Diagnosis not present

## 2024-03-28 DIAGNOSIS — C50512 Malignant neoplasm of lower-outer quadrant of left female breast: Secondary | ICD-10-CM

## 2024-03-28 DIAGNOSIS — Z5111 Encounter for antineoplastic chemotherapy: Secondary | ICD-10-CM | POA: Diagnosis not present

## 2024-03-28 LAB — CBC WITH DIFFERENTIAL (CANCER CENTER ONLY)
Abs Immature Granulocytes: 0.01 K/uL (ref 0.00–0.07)
Basophils Absolute: 0 K/uL (ref 0.0–0.1)
Basophils Relative: 0 %
Eosinophils Absolute: 0 K/uL (ref 0.0–0.5)
Eosinophils Relative: 0 %
HCT: 42 % (ref 36.0–46.0)
Hemoglobin: 13.1 g/dL (ref 12.0–15.0)
Immature Granulocytes: 0 %
Lymphocytes Relative: 44 %
Lymphs Abs: 1.4 K/uL (ref 0.7–4.0)
MCH: 25.7 pg — ABNORMAL LOW (ref 26.0–34.0)
MCHC: 31.2 g/dL (ref 30.0–36.0)
MCV: 82.5 fL (ref 80.0–100.0)
Monocytes Absolute: 0.7 K/uL (ref 0.1–1.0)
Monocytes Relative: 21 %
Neutro Abs: 1.1 K/uL — ABNORMAL LOW (ref 1.7–7.7)
Neutrophils Relative %: 35 %
Platelet Count: 285 K/uL (ref 150–400)
RBC: 5.09 MIL/uL (ref 3.87–5.11)
RDW: 15.4 % (ref 11.5–15.5)
WBC Count: 3.2 K/uL — ABNORMAL LOW (ref 4.0–10.5)
nRBC: 0 % (ref 0.0–0.2)

## 2024-03-28 LAB — CMP (CANCER CENTER ONLY)
ALT: 9 U/L (ref 0–44)
AST: 18 U/L (ref 15–41)
Albumin: 4.5 g/dL (ref 3.5–5.0)
Alkaline Phosphatase: 97 U/L (ref 38–126)
Anion gap: 7 (ref 5–15)
BUN: 13 mg/dL (ref 8–23)
CO2: 28 mmol/L (ref 22–32)
Calcium: 11.7 mg/dL — ABNORMAL HIGH (ref 8.9–10.3)
Chloride: 103 mmol/L (ref 98–111)
Creatinine: 0.71 mg/dL (ref 0.44–1.00)
GFR, Estimated: >60 mL/min (ref >60)
Glucose, Bld: 81 mg/dL (ref 70–99)
Potassium: 4.1 mmol/L (ref 3.5–5.1)
Sodium: 138 mmol/L (ref 135–145)
Total Bilirubin: 0.5 mg/dL (ref 0.0–1.2)
Total Protein: 8.1 g/dL (ref 6.5–8.1)

## 2024-03-28 MED ORDER — METHOTREXATE SODIUM CHEMO INJECTION (PF) 50 MG/2ML
30.0000 mg/m2 | Freq: Once | INTRAMUSCULAR | Status: AC
  Administered 2024-03-28: 55 mg via INTRAVENOUS
  Filled 2024-03-28: qty 2.2

## 2024-03-28 MED ORDER — DEXAMETHASONE SOD PHOSPHATE PF 10 MG/ML IJ SOLN
10.0000 mg | Freq: Once | INTRAMUSCULAR | Status: AC
  Administered 2024-03-28: 10 mg via INTRAVENOUS

## 2024-03-28 MED ORDER — PALONOSETRON HCL INJECTION 0.25 MG/5ML
0.2500 mg | Freq: Once | INTRAVENOUS | Status: AC
  Administered 2024-03-28: 0.25 mg via INTRAVENOUS
  Filled 2024-03-28: qty 5

## 2024-03-28 MED ORDER — SODIUM CHLORIDE 0.9 % IV SOLN
500.0000 mg/m2 | Freq: Once | INTRAVENOUS | Status: AC
  Administered 2024-03-28: 1000 mg via INTRAVENOUS
  Filled 2024-03-28: qty 50

## 2024-03-28 MED ORDER — SODIUM CHLORIDE 0.9 % IV SOLN: INTRAVENOUS | Status: DC

## 2024-03-28 MED ORDER — LIDOCAINE-PRILOCAINE 2.5-2.5 % EX CREA: 1.0000 | TOPICAL_CREAM | Freq: Once | CUTANEOUS | 1 refills | Status: AC

## 2024-03-28 MED ORDER — FLUOROURACIL CHEMO INJECTION 2.5 GM/50ML
400.0000 mg/m2 | Freq: Once | INTRAVENOUS | Status: AC
  Administered 2024-03-28: 750 mg via INTRAVENOUS
  Filled 2024-03-28: qty 15

## 2024-03-28 NOTE — Progress Notes (Signed)
 Patient Care Team: Theo Iha, MD as PCP - General (Internal Medicine) Rutherford Gain, MD as Consulting Physician (Obstetrics and Gynecology) Theo Iha, MD (Internal Medicine) Tyree Nanetta SAILOR, RN as Oncology Nurse Navigator Gerome, Devere HERO, RN as Oncology Nurse Navigator [name] Cough, MD as Consulting Physician (General Surgery) Odean Potts, MD as Consulting Physician (Hematology and Oncology) Izell Domino, MD as Attending Physician (Radiation Oncology)  DIAGNOSIS:  Encounter Diagnosis  Name Primary?   Malignant neoplasm of lower-outer quadrant of left breast of female, estrogen receptor negative (HCC) Yes    SUMMARY OF ONCOLOGIC HISTORY: Oncology History  Malignant neoplasm of lower-outer quadrant of left breast of female, estrogen receptor negative (HCC)  01/09/2024 Initial Diagnosis   Palpable left breast mass: 1.6 cm 5 o'clock position, axilla negative, biopsy: Grade 2 IDC ER 0%, PR 0%, Ki67 30%, HER2 0   02/01/2024 Cancer Staging   Staging form: Breast, AJCC 8th Edition - Clinical: Stage IB (cT1c, cN0, cM0, G2, ER-, PR-, HER2-) - Signed by Odean Potts, MD on 02/01/2024 Stage prefix: Initial diagnosis Histologic grading system: 3 grade system   02/21/2024 Surgery   Left lumpectomy: Grade 2 IDC 2.2 cm, margins negative, ER 0%, PR 0%, HER2 negative, Ki67 30%, 0/4 lymph nodes negative   03/28/2024 -  Chemotherapy   Patient is on Treatment Plan : BREAST CMF IV q21d       CHIEF COMPLIANT: Cycle 1 CMF  HISTORY OF PRESENT ILLNESS:   History of Present Illness [name] is a 78 year old female who presents for chemotherapy treatment and monitoring of her white blood cell count. She is accompanied by her daughter.  She is undergoing chemotherapy and has a low white blood cell count of 3.2, with neutrophils at 1.1. Her white count has been low since 2020. She has nausea medication available.     ALLERGIES:  has no known  allergies.  MEDICATIONS:  Current Outpatient Medications  Medication Sig Dispense Refill   acetaminophen  (TYLENOL ) 500 MG tablet Take 1,000 mg by mouth every 6 (six) hours as needed for moderate pain. (Patient not taking: Reported on 03/28/2024)     Ascorbic Acid (VITAMIN C) 1000 MG tablet Take 1,000 mg by mouth daily. (Patient not taking: Reported on 03/28/2024)     b complex vitamins tablet Take 1 tablet by mouth daily. (Patient not taking: Reported on 03/28/2024)     BLACK CURRANT SEED OIL PO Take by mouth. (Patient not taking: Reported on 03/28/2024)     Cholecalciferol (VITAMIN D3) 50 MCG (2000 UT) capsule Vitamin D3 (Patient not taking: Reported on 03/28/2024)     dexamethasone  (DECADRON ) 4 MG tablet Take 1 tablet (4 mg total) by mouth daily. Start the day after chemotherapy for 2 days. Take with food. (Patient not taking: Reported on 03/28/2024) 12 tablet 0   Ginkgo Biloba (GINKOBA PO) Take 1 tablet by mouth daily. (Patient not taking: Reported on 03/28/2024)     Glucosamine HCl (GLUCOSAMINE PO) Take 3 capsules by mouth daily. (Patient not taking: Reported on 03/28/2024)     Iron-Vitamin C (IRON 100/C) 100-250 MG TABS Take 1 capsule by mouth daily.  (Patient not taking: Reported on 03/28/2024)     Lotilaner (XDEMVY) 0.25 % SOLN Apply to eye. (Patient not taking: Reported on 03/28/2024)     MAGNESIUM GLYCINATE PLUS PO Take 400 mg by mouth daily.  (Patient not taking: Reported on 03/28/2024)     ondansetron  (ZOFRAN ) 8 MG tablet Take 1 tablet (8 mg total) by mouth  every 8 (eight) hours as needed for nausea or vomiting. Start on the third day after chemotherapy. (Patient not taking: Reported on 03/28/2024) 30 tablet 1   Polyvinyl Alcohol-Povidone (REFRESH OP) Apply 1 drop to eye daily as needed (dry eyes). (Patient not taking: Reported on 03/28/2024)     Probiotic Product (PROBIOTIC & ACIDOPHILUS EX ST PO) Take 1 tablet by mouth daily.  (Patient not taking: Reported on 03/28/2024)      prochlorperazine (COMPAZINE) 10 MG tablet Take 1 tablet (10 mg total) by mouth every 6 (six) hours as needed for nausea or vomiting. (Patient not taking: Reported on 03/28/2024) 30 tablet 1   Protein POWD Take 1 scoop by mouth daily. Mix with water (Patient not taking: Reported on 03/28/2024)     traMADol  (ULTRAM ) 50 MG tablet Take 1 tablet (50 mg total) by mouth every 6 (six) hours as needed. (Patient not taking: Reported on 03/28/2024) 10 tablet 0   trimethoprim-polymyxin b (POLYTRIM) ophthalmic solution every 4 (four) hours. (Patient not taking: Reported on 03/28/2024)     No current facility-administered medications for this visit.    PHYSICAL EXAMINATION: ECOG PERFORMANCE STATUS: 1 - Symptomatic but completely ambulatory  Vitals:   03/28/24 0920  BP: (!) 140/84  Pulse: 81  Resp: 18  Temp: 97.6 F (36.4 C)  SpO2: 100%   Filed Weights   03/28/24 0920  Weight: 157 lb 1.6 oz (71.3 kg)      LABORATORY DATA:  I have reviewed the data as listed    Latest Ref Rng & Units 02/03/2024   12:06 PM 04/27/2021    2:20 PM 02/18/2019    6:05 PM  CMP  Glucose 70 - 99 mg/dL 89  71  879   BUN 8 - 23 mg/dL 22  10  10    Creatinine 0.44 - 1.00 mg/dL 9.27  9.24  8.93   Sodium 135 - 145 mmol/L 138  141  137   Potassium 3.5 - 5.1 mmol/L 4.9  3.7  3.1   Chloride 98 - 111 mmol/L 104  106  105   CO2 22 - 32 mmol/L 29  24  21    Calcium 8.9 - 10.3 mg/dL 89.5  89.1  8.8   Total Protein 6.5 - 8.1 g/dL 7.9  8.7  6.2   Total Bilirubin 0.0 - 1.2 mg/dL 0.5  0.5  0.5   Alkaline Phos 38 - 126 U/L 82  122  58   AST 15 - 41 U/L 15  19  24    ALT 0 - 44 U/L 16  14  11      Lab Results  Component Value Date   WBC 3.2 (L) 03/28/2024   HGB 13.1 03/28/2024   HCT 42.0 03/28/2024   MCV 82.5 03/28/2024   PLT 285 03/28/2024   NEUTROABS 1.1 (L) 03/28/2024    ASSESSMENT & PLAN:  Malignant neoplasm of lower-outer quadrant of left breast of female, estrogen receptor negative (HCC) 01/09/24: Palpable left  breast mass: 1.6 cm 5 o'clock position, axilla negative, biopsy: Grade 2 IDC ER 0%, PR 0%, Ki67 30%, HER2 0   Treatment plan: 02/21/2024: Left lumpectomy: Grade 2 IDC 2.2 cm, margins negative, ER 0%, PR 0%, HER2 negative, Ki67 30%, 0/4 lymph nodes negative Adjuvant chemotherapy with CMF x 6 cycles Adjuvant radiation therapy ------------------------------------------------------------------------------------------------------------------------------------------ Current Treatment: Cycle 1 CMF Chemo education completed, labs and anti emetics were reviewed Patient is starting off with an ANC of 1.1. I discussed with her that if she was  to become neutropenic then we will request authorization for pegfilgrastim to be given after her chemo. RTC in 1 week for Tox check   Assessment & Plan Breast cancer of left lower-outer quadrant Undergoing chemotherapy with adjusted lower dose to assess tolerance and minimize side effects. Discussed potential for chemotherapy to lower blood counts, with 60-70% not experiencing significant effects. Emphasized importance of monitoring blood counts. Insurance requires evidence for booster shot if counts drop significantly. - Administer chemotherapy at lower initial dose. - Monitor blood counts weekly. - Avoid crowded places to minimize infection risk. - Allow family gatherings with precautions against sick contacts.  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 Neutropenia may worsen with chemotherapy but expected to recover. Insurance requires evidence for booster shot if counts drop significantly. - Monitor WBC and neutrophil levels weekly. - Consider booster shot if neutrophil count drops significantly. - Avoid exposure to sick individuals.      No orders of the defined types were placed in this encounter.  The patient has a good understanding of the overall plan. she agrees  with it. she will call with any problems that may develop before the next visit here.  I personally spent a total of 30 minutes in the care of the patient today including preparing to see the patient, getting/reviewing separately obtained history, performing a medically appropriate exam/evaluation, counseling and educating, placing orders, referring and communicating with other health care professionals, documenting clinical information in the EHR, independently interpreting results, communicating results, and coordinating care.   Viinay K Zakhari Fogel, MD 03/28/24

## 2024-03-28 NOTE — Assessment & Plan Note (Signed)
 01/09/24: Palpable left breast mass: 1.6 cm 5 o'clock position, axilla negative, biopsy: Grade 2 IDC ER 0%, PR 0%, Ki67 30%, HER2 0   Treatment plan: 02/21/2024: Left lumpectomy: Grade 2 IDC 2.2 cm, margins negative, ER 0%, PR 0%, HER2 negative, Ki67 30%, 0/4 lymph nodes negative Adjuvant chemotherapy with CMF x 6 cycles Adjuvant radiation therapy ------------------------------------------------------------------------------------------------------------------------------------------ Current Treatment: Cycle 1 CMF Chemo education completed, labs and anti emetics were reviewed RTC in 1 week for Tox check

## 2024-03-28 NOTE — Patient Instructions (Signed)
 [facility] - A DEPT OF [facility]  Discharge Instructions: Thank you for choosing [facility] to provide your oncology and hematology care.   If you have a lab appointment with the Cancer Center, please go directly to the Cancer Center and check in at the registration area.   Wear comfortable clothing and clothing appropriate for easy access to any Portacath or PICC line.   We strive to give you quality time with your provider. You may need to reschedule your appointment if you arrive late (15 or more minutes).  Arriving late affects you and other patients whose appointments are after yours.  Also, if you miss three or more appointments without notifying the office, you may be dismissed from the clinic at the provider's discretion.      For prescription refill requests, have your pharmacy contact our office and allow 72 hours for refills to be completed.    Today you received the following chemotherapy and/or immunotherapy agents: Cyclophosphamide(cytoxan), Methotrexate, Adrucil (fluorouracil)   To help prevent nausea and vomiting after your treatment, we encourage you to take your nausea medication as directed.  BELOW ARE SYMPTOMS THAT SHOULD BE REPORTED IMMEDIATELY: *FEVER GREATER THAN 100.4 F (38 C) OR HIGHER *CHILLS OR SWEATING *NAUSEA AND VOMITING THAT IS NOT CONTROLLED WITH YOUR NAUSEA MEDICATION *UNUSUAL SHORTNESS OF BREATH *UNUSUAL BRUISING OR BLEEDING *URINARY PROBLEMS (pain or burning when urinating, or frequent urination) *BOWEL PROBLEMS (unusual diarrhea, constipation, pain near the anus) TENDERNESS IN MOUTH AND THROAT WITH OR WITHOUT PRESENCE OF ULCERS (sore throat, sores in mouth, or a toothache) UNUSUAL RASH, SWELLING OR PAIN  UNUSUAL VAGINAL DISCHARGE OR ITCHING   Items with * indicate a potential emergency and should be followed up as soon as possible or go to the Emergency Department if any problems should occur.  Please  show the CHEMOTHERAPY ALERT CARD or IMMUNOTHERAPY ALERT CARD at check-in to the Emergency Department and triage nurse.  Should you have questions after your visit or need to cancel or reschedule your appointment, please contact [facility] - A DEPT OF [facility]  Dept: [phone]  and follow the prompts.  Office hours are 8:00 a.m. to 4:30 p.m. Monday - Friday. Please note that voicemails left after 4:00 p.m. may not be returned until the following business day.  We are closed weekends and major holidays. You have access to a nurse at all times for urgent questions. Please call the main number to the clinic Dept: [phone] and follow the prompts.   For any non-urgent questions, you may also contact your provider using MyChart. We now offer e-Visits for anyone 2 and older to request care online for non-urgent symptoms. For details visit [email or web address].   Also download the MyChart app! Go to the app store, search MyChart, open the app, select [place], and log in with your MyChart username and password.

## 2024-03-28 NOTE — Progress Notes (Signed)
 Received message from infusion team stating pt is requesting port a cath due to poor venous access.  RN reviewed with MD and verbal orders recevied and placed for port to be placed through IR.

## 2024-03-29 ENCOUNTER — Telehealth: Payer: Self-pay

## 2024-03-29 NOTE — Telephone Encounter (Signed)
-----   Message from Nurse Deland S sent at 03/28/2024  2:42 PM EDT ----- Regarding: 1st time Pt od dr Odean 1st day received Cytoxan, methotrexate and fluorauracil via IV, tolerated it very well and dc from infusions with daughter with no complaints

## 2024-03-30 ENCOUNTER — Ambulatory Visit

## 2024-03-30 ENCOUNTER — Other Ambulatory Visit: Payer: Self-pay

## 2024-04-02 ENCOUNTER — Ambulatory Visit

## 2024-04-03 ENCOUNTER — Other Ambulatory Visit: Payer: Self-pay | Admitting: *Deleted

## 2024-04-03 DIAGNOSIS — Z171 Estrogen receptor negative status [ER-]: Secondary | ICD-10-CM

## 2024-04-04 ENCOUNTER — Inpatient Hospital Stay

## 2024-04-04 ENCOUNTER — Inpatient Hospital Stay (HOSPITAL_BASED_OUTPATIENT_CLINIC_OR_DEPARTMENT_OTHER): Admitting: Hematology and Oncology

## 2024-04-04 ENCOUNTER — Other Ambulatory Visit: Payer: Self-pay | Admitting: *Deleted

## 2024-04-04 ENCOUNTER — Encounter

## 2024-04-04 VITALS — BP 130/74 | HR 68 | Temp 98.6°F | Resp 18 | Ht 66.0 in | Wt 162.1 lb

## 2024-04-04 DIAGNOSIS — Z171 Estrogen receptor negative status [ER-]: Secondary | ICD-10-CM

## 2024-04-04 DIAGNOSIS — C50512 Malignant neoplasm of lower-outer quadrant of left female breast: Secondary | ICD-10-CM

## 2024-04-04 DIAGNOSIS — Z5111 Encounter for antineoplastic chemotherapy: Secondary | ICD-10-CM | POA: Diagnosis not present

## 2024-04-04 LAB — CBC WITH DIFFERENTIAL (CANCER CENTER ONLY)
Abs Immature Granulocytes: 0 K/uL (ref 0.00–0.07)
Basophils Absolute: 0 K/uL (ref 0.0–0.1)
Basophils Relative: 0 %
Eosinophils Absolute: 0 K/uL (ref 0.0–0.5)
Eosinophils Relative: 0 %
HCT: 38 % (ref 36.0–46.0)
Hemoglobin: 11.8 g/dL — ABNORMAL LOW (ref 12.0–15.0)
Immature Granulocytes: 0 %
Lymphocytes Relative: 47 %
Lymphs Abs: 1.3 K/uL (ref 0.7–4.0)
MCH: 25.9 pg — ABNORMAL LOW (ref 26.0–34.0)
MCHC: 31.1 g/dL (ref 30.0–36.0)
MCV: 83.3 fL (ref 80.0–100.0)
Monocytes Absolute: 0.4 K/uL (ref 0.1–1.0)
Monocytes Relative: 15 %
Neutro Abs: 1.1 K/uL — ABNORMAL LOW (ref 1.7–7.7)
Neutrophils Relative %: 38 %
Platelet Count: 292 K/uL (ref 150–400)
RBC: 4.56 MIL/uL (ref 3.87–5.11)
RDW: 15.3 % (ref 11.5–15.5)
WBC Count: 2.8 K/uL — ABNORMAL LOW (ref 4.0–10.5)
nRBC: 0 % (ref 0.0–0.2)

## 2024-04-04 LAB — CMP (CANCER CENTER ONLY)
ALT: 14 U/L (ref 0–44)
AST: 20 U/L (ref 15–41)
Albumin: 4.3 g/dL (ref 3.5–5.0)
Alkaline Phosphatase: 81 U/L (ref 38–126)
Anion gap: 5 (ref 5–15)
BUN: 10 mg/dL (ref 8–23)
CO2: 28 mmol/L (ref 22–32)
Calcium: 10.5 mg/dL — ABNORMAL HIGH (ref 8.9–10.3)
Chloride: 108 mmol/L (ref 98–111)
Creatinine: 0.69 mg/dL (ref 0.44–1.00)
GFR, Estimated: >60 mL/min (ref >60)
Glucose, Bld: 95 mg/dL (ref 70–99)
Potassium: 4.8 mmol/L (ref 3.5–5.1)
Sodium: 141 mmol/L (ref 135–145)
Total Bilirubin: 0.6 mg/dL (ref 0.0–1.2)
Total Protein: 7.5 g/dL (ref 6.5–8.1)

## 2024-04-04 LAB — SEDIMENTATION RATE: Sed Rate: 30 mm/h — ABNORMAL HIGH (ref 0–22)

## 2024-04-04 NOTE — Progress Notes (Signed)
 Patient Care Team: Theo Iha, MD as PCP - General (Internal Medicine) Rutherford Gain, MD as Consulting Physician (Obstetrics and Gynecology) Theo Iha, MD (Internal Medicine) Tyree Nanetta SAILOR, RN as Oncology Nurse Navigator Gerome, Devere HERO, RN as Oncology Nurse Navigator [name] Cough, MD as Consulting Physician (General Surgery) Odean Potts, MD as Consulting Physician (Hematology and Oncology) Izell Domino, MD as Attending Physician (Radiation Oncology)  DIAGNOSIS:  Encounter Diagnosis  Name Primary?   Malignant neoplasm of lower-outer quadrant of left breast of female, estrogen receptor negative (HCC) Yes    SUMMARY OF ONCOLOGIC HISTORY: Oncology History  Malignant neoplasm of lower-outer quadrant of left breast of female, estrogen receptor negative (HCC)  01/09/2024 Initial Diagnosis   Palpable left breast mass: 1.6 cm 5 o'clock position, axilla negative, biopsy: Grade 2 IDC ER 0%, PR 0%, Ki67 30%, HER2 0   02/01/2024 Cancer Staging   Staging form: Breast, AJCC 8th Edition - Clinical: Stage IB (cT1c, cN0, cM0, G2, ER-, PR-, HER2-) - Signed by Odean Potts, MD on 02/01/2024 Stage prefix: Initial diagnosis Histologic grading system: 3 grade system   02/21/2024 Surgery   Left lumpectomy: Grade 2 IDC 2.2 cm, margins negative, ER 0%, PR 0%, HER2 negative, Ki67 30%, 0/4 lymph nodes negative   03/28/2024 -  Chemotherapy   Patient is on Treatment Plan : BREAST CMF IV q21d       CHIEF COMPLIANT: Cycle 1 day 8 CMF  HISTORY OF PRESENT ILLNESS:  History of Present Illness [name] is a 78 year old female undergoing chemotherapy who presents with excessive fatigue and constipation.  She experiences excessive fatigue immediately following chemotherapy, with weakness and wooziness. Constipation has been significant since starting chemotherapy, now on day seven. Initially passing small amounts, she recently experienced severe constipation, unable to  have a bowel movement from 11 PM until 6 AM, eventually passing a large amount. She has been using Dulcolax, thinking it was a stool softener, but has not used Senokot or Colace. No nausea or vomiting occurred during this cycle.     ALLERGIES:  has no known allergies.  MEDICATIONS:  Current Outpatient Medications  Medication Sig Dispense Refill   acetaminophen  (TYLENOL ) 500 MG tablet Take 1,000 mg by mouth every 6 (six) hours as needed for moderate pain. (Patient not taking: Reported on 03/28/2024)     Ascorbic Acid (VITAMIN C) 1000 MG tablet Take 1,000 mg by mouth daily. (Patient not taking: Reported on 03/28/2024)     b complex vitamins tablet Take 1 tablet by mouth daily. (Patient not taking: Reported on 03/28/2024)     BLACK CURRANT SEED OIL PO Take by mouth. (Patient not taking: Reported on 03/28/2024)     Cholecalciferol (VITAMIN D3) 50 MCG (2000 UT) capsule Vitamin D3 (Patient not taking: Reported on 03/28/2024)     dexamethasone  (DECADRON ) 4 MG tablet Take 1 tablet (4 mg total) by mouth daily. Start the day after chemotherapy for 2 days. Take with food. (Patient not taking: Reported on 03/28/2024) 12 tablet 0   Ginkgo Biloba (GINKOBA PO) Take 1 tablet by mouth daily. (Patient not taking: Reported on 03/28/2024)     Glucosamine HCl (GLUCOSAMINE PO) Take 3 capsules by mouth daily. (Patient not taking: Reported on 03/28/2024)     Iron-Vitamin C (IRON 100/C) 100-250 MG TABS Take 1 capsule by mouth daily.  (Patient not taking: Reported on 03/28/2024)     Lotilaner (XDEMVY) 0.25 % SOLN Apply to eye. (Patient not taking: Reported on 03/28/2024)  MAGNESIUM GLYCINATE PLUS PO Take 400 mg by mouth daily.  (Patient not taking: Reported on 03/28/2024)     ondansetron  (ZOFRAN ) 8 MG tablet Take 1 tablet (8 mg total) by mouth every 8 (eight) hours as needed for nausea or vomiting. Start on the third day after chemotherapy. (Patient not taking: Reported on 03/28/2024) 30 tablet 1   Polyvinyl  Alcohol-Povidone (REFRESH OP) Apply 1 drop to eye daily as needed (dry eyes). (Patient not taking: Reported on 03/28/2024)     Probiotic Product (PROBIOTIC & ACIDOPHILUS EX ST PO) Take 1 tablet by mouth daily.  (Patient not taking: Reported on 03/28/2024)     prochlorperazine (COMPAZINE) 10 MG tablet Take 1 tablet (10 mg total) by mouth every 6 (six) hours as needed for nausea or vomiting. (Patient not taking: Reported on 03/28/2024) 30 tablet 1   Protein POWD Take 1 scoop by mouth daily. Mix with water (Patient not taking: Reported on 03/28/2024)     traMADol  (ULTRAM ) 50 MG tablet Take 1 tablet (50 mg total) by mouth every 6 (six) hours as needed. (Patient not taking: Reported on 03/28/2024) 10 tablet 0   trimethoprim-polymyxin b (POLYTRIM) ophthalmic solution every 4 (four) hours. (Patient not taking: Reported on 03/28/2024)     No current facility-administered medications for this visit.    PHYSICAL EXAMINATION: ECOG PERFORMANCE STATUS: 1 - Symptomatic but completely ambulatory  Vitals:   04/04/24 1115  BP: 130/74  Pulse: 68  Resp: 18  Temp: 98.6 F (37 C)  SpO2: 97%   Filed Weights   04/04/24 1115  Weight: 162 lb 1.6 oz (73.5 kg)    Physical Exam   (exam performed in the presence of a chaperone)  LABORATORY DATA:  I have reviewed the data as listed    Latest Ref Rng & Units 03/28/2024    9:03 AM 02/03/2024   12:06 PM 04/27/2021    2:20 PM  CMP  Glucose 70 - 99 mg/dL 81  89  71   BUN 8 - 23 mg/dL 13  22  10    Creatinine 0.44 - 1.00 mg/dL 9.28  9.27  9.24   Sodium 135 - 145 mmol/L 138  138  141   Potassium 3.5 - 5.1 mmol/L 4.1  4.9  3.7   Chloride 98 - 111 mmol/L 103  104  106   CO2 22 - 32 mmol/L 28  29  24    Calcium 8.9 - 10.3 mg/dL 88.2  89.5  89.1   Total Protein 6.5 - 8.1 g/dL 8.1  7.9  8.7   Total Bilirubin 0.0 - 1.2 mg/dL 0.5  0.5  0.5   Alkaline Phos 38 - 126 U/L 97  82  122   AST 15 - 41 U/L 18  15  19    ALT 0 - 44 U/L 9  16  14      Lab Results   Component Value Date   WBC 2.8 (L) 04/04/2024   HGB 11.8 (L) 04/04/2024   HCT 38.0 04/04/2024   MCV 83.3 04/04/2024   PLT 292 04/04/2024   NEUTROABS 1.1 (L) 04/04/2024    ASSESSMENT & PLAN:  Malignant neoplasm of lower-outer quadrant of left breast of female, estrogen receptor negative (HCC) 01/09/24: Palpable left breast mass: 1.6 cm 5 o'clock position, axilla negative, biopsy: Grade 2 IDC ER 0%, PR 0%, Ki67 30%, HER2 0   Treatment plan: 02/21/2024: Left lumpectomy: Grade 2 IDC 2.2 cm, margins negative, ER 0%, PR 0%, HER2 negative, Ki67 30%, 0/4  lymph nodes negative Adjuvant chemotherapy with CMF x 6 cycles Adjuvant radiation therapy ------------------------------------------------------------------------------------------------------------------------------------------ Current Treatment: Cycle 1 day 8 CMF Chemo toxicities: Fatigue: Lasted a whole week Leukopenia/neutropenia: ANC 1.1 at the start of chemo.  Today's ANC is still 1.1. Mild anemia Constipation: Encouraged her to take stool softeners regularly and then laxatives as needed  Return to clinic in 2 weeks for cycle 2 ------------------------------------- Assessment and Plan Assessment & Plan Malignant neoplasm of lower-outer quadrant of left breast undergoing chemotherapy Currently tolerating chemotherapy well with fatigue as the primary side effect. - Advise gentle hair care practices, including using coconut oil before washing and avoiding high heat.  Chemotherapy-induced neutropenia Chronic neutropenia with neutrophil count at 1.1, no increased infection risk. - Monitor neutrophil count regularly.  Chemotherapy-induced anemia Mild anemia with hemoglobin at 11.8, slightly below normal but not concerning. - Monitor hemoglobin levels.  Chemotherapy-induced fatigue Fatigue is the most significant side effect, expected to improve after the first week.  Chemotherapy-induced constipation Severe constipation resolved  with bowel movement. Misunderstanding about Dulcolax as a laxative rather than a stool softener. - Recommend taking Colace, two tablets daily with lunch and dinner. - Continue Dulcolax as needed for acute constipation. - Ensure adequate hydration.      No orders of the defined types were placed in this encounter.  The patient has a good understanding of the overall plan. she agrees with it. she will call with any problems that may develop before the next visit here.  I personally spent a total of 30 minutes in the care of the patient today including preparing to see the patient, getting/reviewing separately obtained history, performing a medically appropriate exam/evaluation, counseling and educating, placing orders, referring and communicating with other health care professionals, documenting clinical information in the EHR, independently interpreting results, communicating results, and coordinating care.   Viinay K Kristain Filo, MD 04/04/24

## 2024-04-04 NOTE — Assessment & Plan Note (Signed)
 01/09/24: Palpable left breast mass: 1.6 cm 5 o'clock position, axilla negative, biopsy: Grade 2 IDC ER 0%, PR 0%, Ki67 30%, HER2 0   Treatment plan: 02/21/2024: Left lumpectomy: Grade 2 IDC 2.2 cm, margins negative, ER 0%, PR 0%, HER2 negative, Ki67 30%, 0/4 lymph nodes negative Adjuvant chemotherapy with CMF x 6 cycles Adjuvant radiation therapy ------------------------------------------------------------------------------------------------------------------------------------------ Current Treatment: Cycle 1 day 8 CMF Chemo toxicities:   Return to clinic in 2 weeks for cycle 2

## 2024-04-09 ENCOUNTER — Other Ambulatory Visit: Payer: Self-pay | Admitting: Radiology

## 2024-04-09 NOTE — H&P (Signed)
 Chief Complaint: Left breast cancer; referred for image guided Port-A-Cath to assist with treatment  Referring Provider(s): [name],V  Supervising Physician: Jennefer Rover  Patient Status: [name] - Out-pt  History of Present Illness: [name] is a 78 y.o. female with past medical history significant for anemia, arthritis, TIA, vertigo, and recently diagnosed left breast carcinoma, status post left lumpectomy on 02/21/2024.  She presents today for Port-A-Cath placement to assist with treatment.  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   TIA (transient ischemic attack) 1988   occurred three days after anesthesia   Vertigo 08/07/2013    Past Surgical History:  Procedure Laterality Date   APPENDECTOMY     ARTERY BIOPSY Left 03/09/2018   Procedure: BIOPSY LEFT TEMPORAL ARTERY;  Surgeon: Kimble Agent, MD;  Location: [facility];  Service: General;  Laterality: Left;  MAC   BREAST BIOPSY Left 01/09/2024   US  LT BREAST BX W LOC DEV 1ST LESION IMG BX SPEC US  GUIDE 01/09/2024 [facility] MAMMOGRAPHY   BREAST BIOPSY Left 02/17/2024   US  LT RADIOACTIVE SEED LOC 02/17/2024 [facility] MAMMOGRAPHY   BREAST LUMPECTOMY WITH RADIOACTIVE SEED AND SENTINEL LYMPH NODE BIOPSY Left 02/21/2024   Procedure: BREAST LUMPECTOMY WITH RADIOACTIVE SEED AND SENTINEL LYMPH NODE BIOPSY;  Surgeon: [name] Cough, MD;  Location: [facility];  Service: General;  Laterality: Left;  LMA LEFT BREAST SEED GUIDED LUMPECTOMY LEFT AXILLARY SENTINEL NODE BIOPSY   CERVICAL CONE BIOPSY     COLONOSCOPY     CYSTOCELE REPAIR      DILATATION & CURETTAGE/HYSTEROSCOPY WITH MYOSURE N/A 08/08/2015   Procedure: DILATATION & CURETTAGE/HYSTEROSCOPY WITH MYOSURE;  Surgeon: Dickie Carder, MD;  Location: [facility];  Service: Gynecology;  Laterality: N/A;   TONSILLECTOMY     TUBAL LIGATION      Allergies: Patient has no known allergies.  Medications: Prior to Admission medications   Medication Sig Start Date End Date Taking? Authorizing Provider  acetaminophen  (TYLENOL ) 500 MG tablet Take 1,000 mg by mouth every 6 (six) hours as needed for moderate pain. Patient not taking: Reported on 03/28/2024    [provider]  Ascorbic Acid (VITAMIN C) 1000 MG tablet Take 1,000 mg by mouth daily. Patient not taking: Reported on 03/28/2024    [provider]  b complex vitamins tablet Take 1 tablet by mouth daily. Patient not taking: Reported on 03/28/2024    [provider]  BLACK CURRANT SEED OIL PO Take by mouth. Patient not taking: Reported on 03/28/2024    [provider]  Cholecalciferol (VITAMIN D3) 50 MCG (2000 UT) capsule Vitamin D3 Patient not taking: Reported on 03/28/2024    [provider]  dexamethasone  (DECADRON ) 4 MG tablet Take 1 tablet (4 mg total) by mouth daily. Start the day after chemotherapy for 2 days. Take with food. Patient not taking: Reported on 03/28/2024 03/08/24   [name], Vinay, MD  Ginkgo Biloba (GINKOBA PO) Take 1 tablet by mouth daily. Patient not taking: Reported on 03/28/2024  [provider]  Glucosamine HCl (GLUCOSAMINE PO) Take 3 capsules by mouth daily. Patient not taking: Reported on 03/28/2024    [provider]  Iron-Vitamin C (IRON 100/C) 100-250 MG TABS Take 1 capsule by mouth daily.  Patient not taking: Reported on 03/28/2024    [provider]  Lotilaner (XDEMVY) 0.25 % SOLN Apply to eye. Patient not taking: Reported on 03/28/2024    [provider]  MAGNESIUM GLYCINATE PLUS PO Take 400 mg by mouth daily.   Patient not taking: Reported on 03/28/2024    [provider]  ondansetron  (ZOFRAN ) 8 MG tablet Take 1 tablet (8 mg total) by mouth every 8 (eight) hours as needed for nausea or vomiting. Start on the third day after chemotherapy. Patient not taking: Reported on 03/28/2024 03/08/24   [name], Vinay, MD  Polyvinyl Alcohol-Povidone (REFRESH OP) Apply 1 drop to eye daily as needed (dry eyes). Patient not taking: Reported on 03/28/2024    [provider]  Probiotic Product (PROBIOTIC & ACIDOPHILUS EX ST PO) Take 1 tablet by mouth daily.  Patient not taking: Reported on 03/28/2024    [provider]  prochlorperazine (COMPAZINE) 10 MG tablet Take 1 tablet (10 mg total) by mouth every 6 (six) hours as needed for nausea or vomiting. Patient not taking: Reported on 03/28/2024 03/08/24   Odean Potts, MD  Protein POWD Take 1 scoop by mouth daily. Mix with water Patient not taking: Reported on 03/28/2024    [provider]  traMADol  (ULTRAM ) 50 MG tablet Take 1 tablet (50 mg total) by mouth every 6 (six) hours as needed. Patient not taking: Reported on 03/28/2024 02/21/24   [name] Cough, MD  trimethoprim-polymyxin b [facility] 2) ophthalmic solution every 4 (four) hours. Patient not taking: Reported on 03/28/2024    [provider]     Family History  Problem Relation Age of Onset   Cancer Mother    Heart disease Father    Heart failure Father    Coronary artery disease Other     Social History   Socioeconomic History   Marital status: Married    Spouse name: Therapist, Occupational   Number of children: 6   Years of education: 77   Highest education level: Master's degree (e.g., MA, MS, MEng, MEd, MSW, MBA)  Occupational History   Occupation: Retired  Tobacco Use   Smoking status: Never   Smokeless tobacco: Never  Vaping Use   Vaping status: Never Used  Substance and Sexual Activity   Alcohol use: No   Drug use: No   Sexual activity: Not Currently     Birth control/protection: Post-menopausal  Other Topics Concern   Not on file  Social History Narrative   Patient is married Conservation Officer, Historic Buildings) and lives at home with her husband and her daughter.   Patient has five living children and one is deceased.   Patient is a retired runner, broadcasting/film/video.   Patient has a Scientist, Water Quality.   Patient is right handed.   Uses very little caffeine.   Social Drivers of Corporate Investment Banker Strain: Not on file  Food Insecurity: No Food Insecurity (02/01/2024)   Hunger Vital Sign    Worried About Running Out of Food in the Last Year: Never true    Ran Out of Food in the Last Year: Never true  Transportation Needs: No Transportation Needs (02/01/2024)   PRAPARE - Administrator, Civil Service (Medical): No    Lack of Transportation (Non-Medical): No  Physical Activity:  Not on file  Stress: Not on file  Social Connections: Not on file       Review of Systems  Vital Signs:   Advance Care Plan: No documents on file   Physical Exam  Imaging: No results found.  Labs:  CBC: Recent Labs    02/03/24 1206 03/28/24 0903 04/04/24 1049  WBC 4.8 3.2* 2.8*  HGB 13.2 13.1 11.8*  HCT 43.2 42.0 38.0  PLT 333 285 292    COAGS: No results for input(s): INR, APTT in the last 8760 hours.  BMP: Recent Labs    02/03/24 1206 03/28/24 0903 04/04/24 1049  NA 138 138 141  K 4.9 4.1 4.8  CL 104 103 108  CO2 29 28 28   GLUCOSE 89 81 95  BUN 22 13 10   CALCIUM 10.4* 11.7* 10.5*  CREATININE 0.72 0.71 0.69  GFRNONAA >60 >60 >60    LIVER FUNCTION TESTS: Recent Labs    02/03/24 1206 03/28/24 0903 04/04/24 1049  BILITOT 0.5 0.5 0.6  AST 15 18 20   ALT 16 9 14   ALKPHOS 82 97 81  PROT 7.9 8.1 7.5  ALBUMIN 4.4 4.5 4.3    TUMOR MARKERS: No results for input(s): AFPTM, CEA, CA199, CHROMGRNA in the last 8760 hours.  Assessment and Plan: 78 y.o. female with past medical history significant for anemia, arthritis, TIA, vertigo, and  recently diagnosed left breast carcinoma, status post left lumpectomy on 02/21/2024.  She presents today for Port-A-Cath placement to assist with treatment.Risks and benefits of image guided port-a-catheter placement was discussed with the patient including, but not limited to bleeding, infection, pneumothorax, or fibrin sheath development and need for additional procedures.  All of the patient's questions were answered, patient is agreeable to proceed. Consent signed and in chart.    Thank you for allowing our service to participate in [name] 's care.  Electronically Signed: D. Franky Rakers, PA-C   04/09/2024, 4:42 PM      I spent a total of  20 minutes   in face to face in clinical consultation, greater than 50% of which was counseling/coordinating care for port a cath placement

## 2024-04-10 ENCOUNTER — Ambulatory Visit (HOSPITAL_COMMUNITY)
Admission: RE | Admit: 2024-04-10 | Discharge: 2024-04-10 | Disposition: A | Discharge status: Home / Self Care | Source: Ambulatory Visit | Attending: Hematology and Oncology | Admitting: Hematology and Oncology

## 2024-04-10 ENCOUNTER — Encounter (HOSPITAL_COMMUNITY): Payer: Self-pay

## 2024-04-10 ENCOUNTER — Other Ambulatory Visit: Payer: Self-pay

## 2024-04-10 DIAGNOSIS — C50512 Malignant neoplasm of lower-outer quadrant of left female breast: Secondary | ICD-10-CM | POA: Diagnosis present

## 2024-04-10 DIAGNOSIS — Z8673 Personal history of transient ischemic attack (TIA), and cerebral infarction without residual deficits: Secondary | ICD-10-CM | POA: Insufficient documentation

## 2024-04-10 DIAGNOSIS — Z171 Estrogen receptor negative status [ER-]: Secondary | ICD-10-CM | POA: Diagnosis not present

## 2024-04-10 HISTORY — PX: IR IMAGING GUIDED PORT INSERTION: IMG5740

## 2024-04-10 MED ORDER — FENTANYL CITRATE (PF) 100 MCG/2ML IJ SOLN
INTRAMUSCULAR | Status: AC | PRN
Start: 2024-04-10 — End: 2024-04-10
  Administered 2024-04-10 (×2): 25 ug via INTRAVENOUS

## 2024-04-10 MED ORDER — FENTANYL CITRATE (PF) 100 MCG/2ML IJ SOLN
INTRAMUSCULAR | Status: AC
  Filled 2024-04-10: qty 2

## 2024-04-10 MED ORDER — HEPARIN SOD (PORK) LOCK FLUSH 100 UNIT/ML IV SOLN
500.0000 [IU] | Freq: Once | INTRAVENOUS | Status: AC
  Administered 2024-04-10: 500 [IU] via INTRAVENOUS

## 2024-04-10 MED ORDER — MIDAZOLAM HCL (PF) 2 MG/2ML IJ SOLN
INTRAMUSCULAR | Status: AC | PRN
  Administered 2024-04-10: 1 mg via INTRAVENOUS

## 2024-04-10 MED ORDER — HEPARIN SOD (PORK) LOCK FLUSH 100 UNIT/ML IV SOLN
INTRAVENOUS | Status: AC
  Filled 2024-04-10: qty 5

## 2024-04-10 MED ORDER — SODIUM CHLORIDE 0.9 % IV SOLN: INTRAVENOUS | Status: DC

## 2024-04-10 MED ORDER — LIDOCAINE-EPINEPHRINE 1 %-1:100000 IJ SOLN
INTRAMUSCULAR | Status: AC
  Filled 2024-04-10: qty 1

## 2024-04-10 MED ORDER — LIDOCAINE-EPINEPHRINE 1 %-1:100000 IJ SOLN
20.0000 mL | Freq: Once | INTRAMUSCULAR | Status: AC
  Administered 2024-04-10: 20 mL via INTRADERMAL

## 2024-04-10 MED ORDER — MIDAZOLAM HCL 2 MG/2ML IJ SOLN
INTRAMUSCULAR | Status: AC
  Filled 2024-04-10: qty 2

## 2024-04-10 NOTE — Discharge Instructions (Signed)

## 2024-04-10 NOTE — Procedures (Signed)
 Interventional Radiology Procedure Note  Procedure: Single Lumen Power Port Placement    Access:  Right internal jugular vein  Findings: Catheter tip positioned at cavoatrial junction. Port is ready for immediate use.   Complications: None  EBL: < 10 mL  Recommendations:  - Ok to shower in 24 hours - Do not submerge for 7 days - Routine line care    Ester Sides, MD

## 2024-04-11 ENCOUNTER — Ambulatory Visit

## 2024-04-16 ENCOUNTER — Telehealth: Payer: Self-pay

## 2024-04-16 NOTE — Telephone Encounter (Signed)
 Pt called to let us  know she is having some discomfort at her PAC. She denies redness, swelling, erythema, drainage. Advised pt we would further evaluate at her appt Thursday. She also asked about removing the dsg from having it placed. Advised pt she can remove the dsg, but reinforced education on the incision dermabond and importance of maintaining the patency.   She also states she has experienced urinary incontinence after tx when she takes Decadron  and asks if this is common. She denies dysuria. Advised she could further discuss with MD at her visit Thursday. She is agreeable. Message sent to schedulers to change her lab appts to port flush w/lab.

## 2024-04-17 ENCOUNTER — Ambulatory Visit: Attending: General Surgery

## 2024-04-17 DIAGNOSIS — M25611 Stiffness of right shoulder, not elsewhere classified: Secondary | ICD-10-CM | POA: Diagnosis present

## 2024-04-17 DIAGNOSIS — Z171 Estrogen receptor negative status [ER-]: Secondary | ICD-10-CM | POA: Diagnosis present

## 2024-04-17 DIAGNOSIS — R293 Abnormal posture: Secondary | ICD-10-CM | POA: Diagnosis present

## 2024-04-17 DIAGNOSIS — M6281 Muscle weakness (generalized): Secondary | ICD-10-CM | POA: Diagnosis present

## 2024-04-17 DIAGNOSIS — Z483 Aftercare following surgery for neoplasm: Secondary | ICD-10-CM | POA: Diagnosis present

## 2024-04-17 DIAGNOSIS — M25612 Stiffness of left shoulder, not elsewhere classified: Secondary | ICD-10-CM | POA: Insufficient documentation

## 2024-04-17 DIAGNOSIS — C50512 Malignant neoplasm of lower-outer quadrant of left female breast: Secondary | ICD-10-CM | POA: Diagnosis present

## 2024-04-17 NOTE — Therapy (Addendum)
 OUTPATIENT PHYSICAL THERAPY BREAST CANCER POST OP FOLLOW UP   Patient Name: [name] MRN: [id number] DOB:[birth year], 78 y.o., female Today's Date: 04/17/2024  END OF SESSION:  PT End of Session - 04/17/24 1007     Visit Number 6    Number of Visits 18    Date for Recertification  06/12/24    Authorization Type [employer]    Authorization - Visit Number 4    Authorization - Number of Visits 8    PT Start Time 1008    PT Stop Time 1100    PT Time Calculation (min) 52 min    Activity Tolerance Patient tolerated treatment well    Behavior During Therapy WFL for tasks assessed/performed          Past Medical History:  Diagnosis Date   Abnormal laboratory test 03/02/2018   Anemia    due to GI bleed   Arthritis    bilateral knees   Chest pain    Complication of anesthesia    see note about TIA-32 yrs ago   Concussion 05/11/2013   Dizziness    ECHO and Stress test all normal 04/2015   Head pain 07/25/2015   recent blow to head   History of concussion    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   TIA (transient ischemic attack) 1988   occurred three days after anesthesia   Vertigo 08/07/2013   Past Surgical History:  Procedure Laterality Date   APPENDECTOMY     ARTERY BIOPSY Left 03/09/2018   Procedure: BIOPSY LEFT TEMPORAL ARTERY;  Surgeon: Kimble Agent, MD;  Location: [facility];  Service: General;  Laterality: Left;  MAC   BREAST BIOPSY Left 01/09/2024   US  LT BREAST BX W LOC DEV 1ST LESION IMG BX SPEC US  GUIDE 01/09/2024 [facility]   BREAST BIOPSY Left 02/17/2024   US  LT RADIOACTIVE SEED LOC 02/17/2024 [facility]MO[facility]   BREAST LUMPECTOMY WITH RADIOACTIVE SEED AND SENTINEL LYMPH NODE BIOPSY Left 02/21/2024   Procedure: BREAST LUMPECTOMY WITH RADIOACTIVE SEED AND SENTINEL LYMPH NODE BIOPSY;  Surgeon: [name] Cough, MD;  Location: [facility];  Service: General;  Laterality: Left;  LMA LEFT BREAST SEED GUIDED LUMPECTOMY LEFT AXILLARY SENTINEL  NODE BIOPSY   CERVICAL CONE BIOPSY     COLONOSCOPY     CYSTOCELE REPAIR     DILATATION & CURETTAGE/HYSTEROSCOPY WITH MYOSURE N/A 08/08/2015   Procedure: DILATATION & CURETTAGE/HYSTEROSCOPY WITH MYOSURE;  Surgeon: Dickie Carder, MD;  Location: [facility];  Service: Gynecology;  Laterality: N/A;   IR IMAGING GUIDED PORT INSERTION  04/10/2024   TONSILLECTOMY     TUBAL LIGATION     Patient Active Problem List   Diagnosis Date Noted   Malignant neoplasm of lower-outer quadrant of left breast of female, estrogen receptor negative (HCC) 02/01/2024   Elevated blood pressure reading 10/12/2018   History of pericarditis 10/12/2018   Abnormal laboratory test 03/02/2018   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 Cough [name]   REFERRING DIAG: Left Breast Cancer    THERAPY DIAG:  Stiffness of right shoulder, not elsewhere classified  Malignant neoplasm of lower-outer quadrant of left breast of female, estrogen receptor negative (HCC)  Abnormal posture  Aftercare following surgery for neoplasm  Rationale for Evaluation and Treatment: Rehabilitation  ONSET DATE: 01/09/2024  SUBJECTIVE:  SUBJECTIVE STATEMENT:  I have had a lot of pain in my neck and where the portacath is. Its been like that since day 1. I have messaged Dr. [name] about it.  I was very fatigued after the chemo the first time. I am having some pain in the right shoulder with the exercises so I have not been doing them.  My left side feels great. No pain at all. I get intermittent wooziness and I am not sure if its the chemo or my high sed rate. 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 It measures 1.6 cm and is located in the Lower-outer quadrant. It is triple Negative with a Ki67 of 30%. She is s/p a left Lumpectomy and SLNB on 02/21/2024 with 0+/4 LN. She . She had a seroma that was drained twice. She is having 6 cycles of adjuvant chemotherapy with Taxotere and Cytoxan  and radiation  PATIENT GOALS:  Reassess how my recovery is going related to arm function, pain, and swelling.  PAIN:  Are you having pain? Yes: NPRS scale: 0/10 left, Right 4/10. Pain location: left Pain description:  tingling, achy Aggravating factors: Unable to lift granddaughter, overhead reaching, household chores Relieving factors: Exercises  PRECAUTIONS: Recent Surgery, left UE Lymphedema risk, poly arthralgia being seen by rheumatologist, TIA   RED FLAGS: None   ACTIVITY LEVEL / LEISURE: HEP provided prior to surgery, would like to walk but has not started doing this   OBJECTIVE:   PATIENT SURVEYS:  QUICK DASH: EVAL 29.55   04/17/2024 (for Left UE 9.09)  OBSERVATIONS: Fibrosis and scar tissue at inferior breast incision and at the axillary incision.   POSTURE:  Forward head, rounded shoulders  LYMPHEDEMA ASSESSMENT:  UPPER EXTREMITY AROM/PROM:   A/PROM RIGHT   eval   RIGHT 04/17/2024  Shoulder extension 62 56  Shoulder flexion 143 135 +Pain  Shoulder abduction 175 130+Pain  Shoulder internal rotation 60   Shoulder external rotation 94 80                          (Blank rows = not tested)   A/PROM LEFT   eval LEFT 03/13/2024 LEFT 03/26/2024 LEFT 04/17/2024  Shoulder extension 64 42 65 65  Shoulder flexion 145 138 145 144  Shoulder abduction 165 99, cramp 155 165 some compensation  Shoulder internal rotation 60 Held due to UT compensation    Shoulder external rotation 62, pain  85 pain 85                          (Blank rows = not tested)   CERVICAL  AROM: All within Functionallimits:        UPPER EXTREMITY STRENGTH: WFL, 04/17/2024: Right Grip 29, 26,25            Left 20,25,26   LYMPHEDEMA ASSESSMENTS (in cm):    LANDMARK RIGHT   eval RIGHT 03/13/2024  10 cm proximal to olecranon process 31.6 33.9  Olecranon process 23.5 25.3   10 cm proximal to ulnar styloid process 17.7 18.7  Just proximal to ulnar styloid process 14.6 14.9  Across hand at thumb web space 18.6 18.9  At base of 2nd digit 5.9 6.3  (Blank rows = not tested)   LANDMARK LEFT   eval  LEFT 03/13/2024  10 cm proximal to olecranon process 31.5 32.9  Olecranon process 24.4 25.5  10 cm proximal to ulnar styloid process 18.65 19.1  Just proximal to ulnar styloid process 14.2 14.5  Across hand at thumb web space 18.5 19  At base of 2nd digit 6.2 6.5  (Blank rows = not tested)  Surgery type/Date: 9/16/2025Left Lumpectomy with SLNB Number of lymph nodes removed: 0/4 Current/past treatment (chemo, radiation, hormone therapy): pending chemo, radiation Other symptoms:  Heaviness/tightness Yes Pain Yes Pitting edema No Infections No Decreased scar mobility Yes Stemmer sign No   TREATMENT TODAY  04/17/2024 Measured AROM; Left WNL, right(non-surgical) limited by shoulder pain Right Grip 29, 26,25            Left 20,25,26 Instructed theraband exercises with yellow for bilateral scapular retraction, shoulder extension and bilateral ER each x 10 no Pain. Pt is to do ER to tension only with minimal AROM and no pain. Gave pictures and theraband Pt was shown therapy putty that can be purchased online with recommendation for soft or extra soft to help improve grip strength. Discussed Right shoulder pain and possible causes;explained that we want to maintain ROM but avoid painful activities;Discussed seeing family practice MD who can refer to orthopedic prn. Checked goals with pt and new goals made 03/26/2024 Pulleys flexion x 2 min, scaption tried  but held on left due  to pain. STM to left UT, pecs, lateral trunk with cocoa butter Supine AROM left shoulder flexion, scaption, horizontal abduction x 5, snow angels sx 5( held on right due to flare of right shoulder pain. PROM left shoulder flex, scaption, abd, ER, IR with VC's to relax Answered pt questions about video Advised pt to be cautious with compensation, and worked on actively depressing scapula with exs in supine with good results. Discussed use of gel pack in toweling on right for right shoulder pain 03/22/2024 Discussed progress, answered questions about scar massage Overhead pulleys 2 min ea. Flex and abduction x 2 min ea, VC and TC to depress scapula. Towel roll behind back Supine wand flex and scaption x 5 ea, 5 sec ea Supine AROM bilateral shoulder flexion, scaption, horizontal abduction x 5, snow angels sx 5 PROM left shoulder flex, scaption, abd, ER, IR with VC's to relax Discussed radiation;continuing exercises,wearing compression as long as possible Checked pts left back;no sign of swelling, redness etc.  03/20/2024  Pulleys x 2 min flex and abd, with VC's and TC's to depress scapulaa Performed scar massage on axillary and breast incisions and Educated pt in scar massage using mirror so she could see and practice. Advised to apply Vit E or coconut oil afterwards. Supine wand flexion and scaption x 5 ea STM to left UT, pectorals and lateral trunk with cocoa butter PROM left shoulder flexion, scaption, abd, IR and ER Updated HEP( wand exs ); printed another copy of post surgery instructions and reminded to watch video, and gave Web site in case link doesn't work so we can discuss, initiate scar massage at home    PATIENT EDUCATION:  Education details: Reviewed POC, scar massage - temporarily hold until incision is healed, ABC class video, wearing compression bra with foam pad, walking at least 30 minutes a day, continuing exercises from HEP 2-3 times a day to assist with ROM  Person  educated: Patient Education method: Explanation and Handouts Education comprehension: verbalized understanding  HOME EXERCISE PROGRAM: Reviewed previously given post op HEP. Added supine wand exercises flex and scaption x  5 ea in place of clasped hands 3 D AROM flex, scapt, horizontal abd, snow angels (written down)  ASSESSMENT:  CLINICAL IMPRESSION: Pts left shoulder ROM is now back to normal however,Pts right shoulder flared up and became painful  and was noted at visit on 03/26/2024 . It is still bothering her, likely from compensating during exercises and she would like to continue to work on reducing her pain and improving the strength of both UE's. Grip strength is also reduced bilaterally. Right shoulder AROM was limited today due to pain. We discussed seeing her family practice Dr who would refer her to an orthopedic if she felt that was necessary. Pt did have a prior RTC repair on the right side. She has watched the ABC video and has no further questions. Quick dash for the Left UE is greatly improved, however, would be more limited now on the right due to recent onset of right shoulder pain. Pt is still undergoing chemotherapy but would like to have continued therapy to improve her strength and decrease right shoulder pain 1-2x/week as she feels up to it. She will benefit from skilled PT to address deficits and return to PLOF.  EVAL Pt is s/p left lumpectomy with SLNB and 0+/4 LN's. She is pending adjuvant chemotherapy and radiation. Objective measurements showed decreased Lt shoulder AROM in all directions with pain reported with abduction. Patient states that she has had her seroma already drained twice and feels as though it is filling again. Mild fibrosis is noted at the incision at the inferior aspect of the breast and the axillar incision. Mild drainage is observed on the paper towel the patient placed over both incisions.   Pt will benefit from skilled therapeutic intervention to  improve on the following deficits: Decreased knowledge of precautions, impaired UE functional use, pain, decreased ROM, postural dysfunction.   PT treatment/interventions: ADL/Self care home management, [phone]- PT Re-evaluation, 97110-Therapeutic exercises, 97530- Therapeutic activity, [id number]- Neuromuscular re-education, 97535- Self Care, and 02859- Manual therapy   GOALS: Goals reviewed with patient? Yes  GOALS MET AT EVAL:  GOALS Name Target Date Goal status  1 Pt will be able to verbalize understanding of pertinent lymphedema risk reduction practices relevant to her dx specifically related to skin care.  Baseline:  No knowledge Eval Achieved at eval  2 Pt will be able to return demo and/or verbalize understanding of the post op HEP related to regaining shoulder ROM. Baseline:  No knowledge Eval Achieved at eval  3 Pt will be able to verbalize understanding of the importance of viewing the post op After Breast CA Class video for further lymphedema risk reduction education and therapeutic exercise.  Baseline:  No knowledge Eval Achieved at eval   LONG TERM GOALS:  (STG=LTG)  GOALS Name Target Date  Goal status  1 Pt will demonstrate she has regained full shoulder ROM and function post operatively compared to baselines.  Baseline: 04/10/24  06/13/2023 Right MET for left 04/17/2024 In Progress Right  2 Patient's Quick-DASH will improve to no greater than 20% to demonstrate improved function 04/10/24 MET for left UE  3 Patient will watch ABC video and have any questions answered.  04/10/24 MET 03/26/2024  4 Pt will have decreased right shoulder pain by atleast 50 for improvements in functional activities ie reaching to cabinets  06/13/2023 NEW  5. Pt will improve bilateral hand grip strength to 32 degrees for improved ability to open jars 06/13/2023 NEW     PLAN:  PT FREQUENCY/DURATION: 1x-2x/week  for 8 weeks or 12 visits  PLAN FOR NEXT SESSION:SOZO screen. any trouble with wand exs,? add  pec stretches;snow angels,pec wall stretch, lat stretch,  PHYSICAL THERAPY DISCHARGE SUMMARY  Visits from Start of Care: 6  Current functional level related to goals / functional outcomes: Pt had achieved all goals for involved left side. Unable to assess new goals for right shoulder pain due to pt calling to cancel appts   Remaining deficits: Unknown. Pt did not return   Education / Equipment: HEP   Patient agrees to discharge. Patient goals were partially met. Patient is being discharged due to not returning since the last visit. Her husband has recently been hospitalized.[facility]  [address]  [place] [address]  [phone]   SHOULDER: Flexion - Supine (Cane)        Cancer Rehab [phone]    Hold cane in both hands. Raise arms up overhead. Do not allow back to arch. Hold _5__ seconds. Do __5__ times; __2__ times a day.  Hands shoulder width apart Hands wider than shoulder width (DV position)   Copyright  VHI. All rights reserved.         After Breast Cancer Class Video It is recommended you view the ABC class video to be educated on lymphedema risk reduction. This video lasts for about 30 minutes. It can be viewed on our website here: [email or web address]  Scar massage You can begin gentle scar massage to you incision sites. Gently place one hand on the incision and move the skin (without sliding on the skin) in various directions. Do this for a few minutes and then you can gently massage either coconut oil or vitamin E cream into the scars.  Compression garment You should continue wearing your compression bra until you feel like you no longer have swelling.  Home exercise Program Continue doing the exercises you were given until you feel like you can do them without feeling any tightness at the end.   Walking Program Studies show that 30 minutes of  walking per day (fast enough to elevate your heart rate) can significantly reduce the risk of a cancer recurrence. If you can't walk due to other medical reasons, we encourage you to find another activity you could do (like a stationary bike or water exercise).  Posture After breast cancer surgery, people frequently sit with rounded shoulders posture because it puts their incisions on slack and feels better. If you sit like this and scar tissue forms in that position, you can become very tight and have pain sitting or standing with good posture. Try to be aware of your posture and sit and stand up tall to heal properly.  Follow up PT: It is recommended you return every 3 months for the first 3 years following surgery to be assessed on the SOZO machine for an L-Dex score. This helps prevent clinically significant lymphedema in 95% of patients. These follow up screens are 10 minute appointments that you are not billed for.   Criss Bartles J Shanie Mauzy, PT 04/17/2024 1:04 PM

## 2024-04-19 ENCOUNTER — Inpatient Hospital Stay: Attending: Hematology and Oncology

## 2024-04-19 ENCOUNTER — Encounter: Payer: Self-pay | Admitting: Hematology and Oncology

## 2024-04-19 ENCOUNTER — Inpatient Hospital Stay

## 2024-04-19 ENCOUNTER — Inpatient Hospital Stay: Attending: Hematology and Oncology | Admitting: Hematology and Oncology

## 2024-04-19 VITALS — BP 131/68 | HR 75 | Temp 98.0°F | Resp 16 | Ht 66.0 in | Wt 160.3 lb

## 2024-04-19 VITALS — BP 121/55 | HR 78 | Temp 98.2°F | Resp 20

## 2024-04-19 DIAGNOSIS — Z5111 Encounter for antineoplastic chemotherapy: Secondary | ICD-10-CM | POA: Diagnosis present

## 2024-04-19 DIAGNOSIS — Z79899 Other long term (current) drug therapy: Secondary | ICD-10-CM | POA: Diagnosis not present

## 2024-04-19 DIAGNOSIS — Z171 Estrogen receptor negative status [ER-]: Secondary | ICD-10-CM | POA: Insufficient documentation

## 2024-04-19 DIAGNOSIS — C50512 Malignant neoplasm of lower-outer quadrant of left female breast: Secondary | ICD-10-CM

## 2024-04-19 LAB — CBC WITH DIFFERENTIAL (CANCER CENTER ONLY)
Abs Immature Granulocytes: 0.01 K/uL (ref 0.00–0.07)
Basophils Absolute: 0 K/uL (ref 0.0–0.1)
Basophils Relative: 1 %
Eosinophils Absolute: 0 K/uL (ref 0.0–0.5)
Eosinophils Relative: 0 %
HCT: 40.8 % (ref 36.0–46.0)
Hemoglobin: 12.5 g/dL (ref 12.0–15.0)
Immature Granulocytes: 0 %
Lymphocytes Relative: 36 %
Lymphs Abs: 1.4 K/uL (ref 0.7–4.0)
MCH: 25.8 pg — ABNORMAL LOW (ref 26.0–34.0)
MCHC: 30.6 g/dL (ref 30.0–36.0)
MCV: 84.1 fL (ref 80.0–100.0)
Monocytes Absolute: 1.1 K/uL — ABNORMAL HIGH (ref 0.1–1.0)
Monocytes Relative: 27 %
Neutro Abs: 1.4 K/uL — ABNORMAL LOW (ref 1.7–7.7)
Neutrophils Relative %: 36 %
Platelet Count: 308 K/uL (ref 150–400)
RBC: 4.85 MIL/uL (ref 3.87–5.11)
RDW: 15.2 % (ref 11.5–15.5)
WBC Count: 4 K/uL (ref 4.0–10.5)
nRBC: 0 % (ref 0.0–0.2)

## 2024-04-19 LAB — CMP (CANCER CENTER ONLY)
ALT: 11 U/L (ref 0–44)
AST: 20 U/L (ref 15–41)
Albumin: 4.6 g/dL (ref 3.5–5.0)
Alkaline Phosphatase: 116 U/L (ref 38–126)
Anion gap: 9 (ref 5–15)
BUN: 10 mg/dL (ref 8–23)
CO2: 25 mmol/L (ref 22–32)
Calcium: 10.9 mg/dL — ABNORMAL HIGH (ref 8.9–10.3)
Chloride: 105 mmol/L (ref 98–111)
Creatinine: 0.63 mg/dL (ref 0.44–1.00)
GFR, Estimated: >60 mL/min (ref >60)
Glucose, Bld: 71 mg/dL (ref 70–99)
Potassium: 4.2 mmol/L (ref 3.5–5.1)
Sodium: 139 mmol/L (ref 135–145)
Total Bilirubin: 0.4 mg/dL (ref 0.0–1.2)
Total Protein: 8.4 g/dL — ABNORMAL HIGH (ref 6.5–8.1)

## 2024-04-19 MED ORDER — SODIUM CHLORIDE 0.9 % IV SOLN
500.0000 mg/m2 | Freq: Once | INTRAVENOUS | Status: AC
  Administered 2024-04-19: 1000 mg via INTRAVENOUS
  Filled 2024-04-19: qty 50

## 2024-04-19 MED ORDER — FLUOROURACIL CHEMO INJECTION 2.5 GM/50ML
400.0000 mg/m2 | Freq: Once | INTRAVENOUS | Status: AC
  Administered 2024-04-19: 750 mg via INTRAVENOUS
  Filled 2024-04-19: qty 15

## 2024-04-19 MED ORDER — SODIUM CHLORIDE 0.9 % IV SOLN: INTRAVENOUS | Status: DC

## 2024-04-19 MED ORDER — METHOTREXATE SODIUM CHEMO INJECTION (PF) 50 MG/2ML
30.0000 mg/m2 | Freq: Once | INTRAMUSCULAR | Status: AC
  Administered 2024-04-19: 55 mg via INTRAVENOUS
  Filled 2024-04-19: qty 2.2

## 2024-04-19 MED ORDER — METHYLPREDNISOLONE SODIUM SUCC 125 MG IJ SOLR
125.0000 mg | Freq: Once | INTRAMUSCULAR | Status: AC | PRN
  Administered 2024-04-19: 125 mg via INTRAVENOUS

## 2024-04-19 MED ORDER — PALONOSETRON HCL INJECTION 0.25 MG/5ML
0.2500 mg | Freq: Once | INTRAVENOUS | Status: AC
  Administered 2024-04-19: 0.25 mg via INTRAVENOUS
  Filled 2024-04-19: qty 5

## 2024-04-19 NOTE — Patient Instructions (Addendum)
 [facility] - A DEPT OF [facility]  Discharge Instructions: Thank you for choosing [facility] to provide your oncology and hematology care.   If you have a lab appointment with the Cancer Center, please go directly to the Cancer Center and check in at the registration area.   Wear comfortable clothing and clothing appropriate for easy access to any Portacath or PICC line.   We strive to give you quality time with your provider. You may need to reschedule your appointment if you arrive late (15 or more minutes).  Arriving late affects you and other patients whose appointments are after yours.  Also, if you miss three or more appointments without notifying the office, you may be dismissed from the clinic at the provider's discretion.      For prescription refill requests, have your pharmacy contact our office and allow 72 hours for refills to be completed.    Today you received the following chemotherapy and/or immunotherapy agents: Cyclophosphamide(cytoxan), Methotrexate, Adrucil (fluorouracil)   To help prevent nausea and vomiting after your treatment, we encourage you to take your nausea medication as directed.  BELOW ARE SYMPTOMS THAT SHOULD BE REPORTED IMMEDIATELY: *FEVER GREATER THAN 100.4 F (38 C) OR HIGHER *CHILLS OR SWEATING *NAUSEA AND VOMITING THAT IS NOT CONTROLLED WITH YOUR NAUSEA MEDICATION *UNUSUAL SHORTNESS OF BREATH *UNUSUAL BRUISING OR BLEEDING *URINARY PROBLEMS (pain or burning when urinating, or frequent urination) *BOWEL PROBLEMS (unusual diarrhea, constipation, pain near the anus) TENDERNESS IN MOUTH AND THROAT WITH OR WITHOUT PRESENCE OF ULCERS (sore throat, sores in mouth, or a toothache) UNUSUAL RASH, SWELLING OR PAIN  UNUSUAL VAGINAL DISCHARGE OR ITCHING   Items with * indicate a potential emergency and should be followed up as soon as possible or go to the Emergency Department if any problems should occur.  Please  show the CHEMOTHERAPY ALERT CARD or IMMUNOTHERAPY ALERT CARD at check-in to the Emergency Department and triage nurse.  Should you have questions after your visit or need to cancel or reschedule your appointment, please contact [facility] - A DEPT OF [facility]  Dept: [phone]  and follow the prompts.  Office hours are 8:00 a.m. to 4:30 p.m. Monday - Friday. Please note that voicemails left after 4:00 p.m. may not be returned until the following business day.  We are closed weekends and major holidays. You have access to a nurse at all times for urgent questions. Please call the main number to the clinic Dept: [phone] and follow the prompts.   For any non-urgent questions, you may also contact your provider using MyChart. We now offer e-Visits for anyone 2 and older to request care online for non-urgent symptoms. For details visit [email or web address].   Also download the MyChart app! Go to the app store, search MyChart, open the app, select [facility], and log in with your MyChart username and password.

## 2024-04-19 NOTE — Assessment & Plan Note (Signed)
 01/09/24: Palpable left breast mass: 1.6 cm 5 o'clock position, axilla negative, biopsy: Grade 2 IDC ER 0%, PR 0%, Ki67 30%, HER2 0   Treatment plan: 02/21/2024: Left lumpectomy: Grade 2 IDC 2.2 cm, margins negative, ER 0%, PR 0%, HER2 negative, Ki67 30%, 0/4 lymph nodes negative Adjuvant chemotherapy with CMF x 6 cycles Adjuvant radiation therapy ------------------------------------------------------------------------------------------------------------------------------------------ Current Treatment: Cycle 2 CMF Chemo toxicities: Fatigue: Lasted a whole week Leukopenia/neutropenia: ANC 1.1 at the start of chemo.  Today's ANC is still 1.1. Mild anemia Constipation: Encouraged her to take stool softeners regularly and then laxatives as needed   Return to clinic in 3 weeks for cycle 3

## 2024-04-19 NOTE — Progress Notes (Signed)
 Pt started to have chills stopped infusion at 1659 administer solumedrol 125mg  in hypersensivity reaction, spoke to Dr onesimo at 1702and stated give steroid and if symptoms resolve may restart treatment. The pts VSS remain stable and chills are resolving. Pt daughter is at chairside. Gave report to Lou­za a, rn

## 2024-04-19 NOTE — Progress Notes (Signed)
 Patient Care Team: Theo Iha, MD as PCP - General (Internal Medicine) Rutherford Gain, MD as Consulting Physician (Obstetrics and Gynecology) Theo Iha, MD (Internal Medicine) Tyree Nanetta SAILOR, RN as Oncology Nurse Navigator Gerome, Devere HERO, RN as Oncology Nurse Navigator [name] Cough, MD as Consulting Physician (General Surgery) Odean Potts, MD as Consulting Physician (Hematology and Oncology) Izell Domino, MD as Attending Physician (Radiation Oncology)  DIAGNOSIS:  Encounter Diagnosis  Name Primary?   Malignant neoplasm of lower-outer quadrant of left breast of female, estrogen receptor negative (HCC) Yes    SUMMARY OF ONCOLOGIC HISTORY: Oncology History  Malignant neoplasm of lower-outer quadrant of left breast of female, estrogen receptor negative (HCC)  01/09/2024 Initial Diagnosis   Palpable left breast mass: 1.6 cm 5 o'clock position, axilla negative, biopsy: Grade 2 IDC ER 0%, PR 0%, Ki67 30%, HER2 0   02/01/2024 Cancer Staging   Staging form: Breast, AJCC 8th Edition - Clinical: Stage IB (cT1c, cN0, cM0, G2, ER-, PR-, HER2-) - Signed by Odean Potts, MD on 02/01/2024 Stage prefix: Initial diagnosis Histologic grading system: 3 grade system   02/21/2024 Surgery   Left lumpectomy: Grade 2 IDC 2.2 cm, margins negative, ER 0%, PR 0%, HER2 negative, Ki67 30%, 0/4 lymph nodes negative   03/28/2024 -  Chemotherapy   Patient is on Treatment Plan : BREAST CMF IV q21d       CHIEF COMPLIANT: Cycle 2 CMF  HISTORY OF PRESENT ILLNESS:  History of Present Illness [name] is a 78 year old female who presents with excruciating arm pain following a recent medical procedure.  She experiences sharp, excruciating arm pain, particularly at night, which began over a week ago. The pain occurs with movement or reaching and is severe enough to cause exclamations and tears. It is localized from her arm downwards and is associated with bruising.  She  has experienced urinary incontinence three times after her first infusion, which her daughter attributes to a reaction to steroids. Incontinence was managed with Poise pads.  Constipation has improved with increased fluid intake, hot liquids, and prune juice. Her recent blood work shows white blood cell count, hemoglobin, and platelets are within normal limits.     ALLERGIES:  has no known allergies.  MEDICATIONS:  Current Outpatient Medications  Medication Sig Dispense Refill   acetaminophen  (TYLENOL ) 500 MG tablet Take 1,000 mg by mouth every 6 (six) hours as needed for moderate pain. (Patient not taking: Reported on 03/28/2024)     Ascorbic Acid (VITAMIN C) 1000 MG tablet Take 1,000 mg by mouth daily. (Patient not taking: Reported on 03/28/2024)     b complex vitamins tablet Take 1 tablet by mouth daily. (Patient not taking: Reported on 03/28/2024)     BLACK CURRANT SEED OIL PO Take by mouth. (Patient not taking: Reported on 03/28/2024)     Cholecalciferol (VITAMIN D3) 50 MCG (2000 UT) capsule Vitamin D3 (Patient not taking: Reported on 03/28/2024)     dexamethasone  (DECADRON ) 4 MG tablet Take 1 tablet (4 mg total) by mouth daily. Start the day after chemotherapy for 2 days. Take with food. (Patient not taking: Reported on 03/28/2024) 12 tablet 0   Ginkgo Biloba (GINKOBA PO) Take 1 tablet by mouth daily. (Patient not taking: Reported on 03/28/2024)     Glucosamine HCl (GLUCOSAMINE PO) Take 3 capsules by mouth daily. (Patient not taking: Reported on 03/28/2024)     Iron-Vitamin C (IRON 100/C) 100-250 MG TABS Take 1 capsule by mouth daily.  (Patient not taking:  Reported on 03/28/2024)     Lotilaner (XDEMVY) 0.25 % SOLN Apply to eye. (Patient not taking: Reported on 03/28/2024)     MAGNESIUM GLYCINATE PLUS PO Take 400 mg by mouth daily.  (Patient not taking: Reported on 03/28/2024)     ondansetron  (ZOFRAN ) 8 MG tablet Take 1 tablet (8 mg total) by mouth every 8 (eight) hours as needed for  nausea or vomiting. Start on the third day after chemotherapy. (Patient not taking: Reported on 03/28/2024) 30 tablet 1   Polyvinyl Alcohol-Povidone (REFRESH OP) Apply 1 drop to eye daily as needed (dry eyes). (Patient not taking: Reported on 03/28/2024)     Probiotic Product (PROBIOTIC & ACIDOPHILUS EX ST PO) Take 1 tablet by mouth daily.  (Patient not taking: Reported on 03/28/2024)     prochlorperazine (COMPAZINE) 10 MG tablet Take 1 tablet (10 mg total) by mouth every 6 (six) hours as needed for nausea or vomiting. (Patient not taking: Reported on 03/28/2024) 30 tablet 1   Protein POWD Take 1 scoop by mouth daily. Mix with water (Patient not taking: Reported on 03/28/2024)     traMADol  (ULTRAM ) 50 MG tablet Take 1 tablet (50 mg total) by mouth every 6 (six) hours as needed. (Patient not taking: Reported on 03/28/2024) 10 tablet 0   trimethoprim-polymyxin b (POLYTRIM) ophthalmic solution every 4 (four) hours. (Patient not taking: Reported on 03/28/2024)     No current facility-administered medications for this visit.    PHYSICAL EXAMINATION: ECOG PERFORMANCE STATUS: 1 - Symptomatic but completely ambulatory  Vitals:   04/19/24 1500  BP: 131/68  Pulse: 75  Resp: 16  Temp: 98 F (36.7 C)  SpO2: 100%   Filed Weights   04/19/24 1500  Weight: 160 lb 4.8 oz (72.7 kg)    Physical Exam Port site: Mild bruising  (exam performed in the presence of a chaperone)  LABORATORY DATA:  I have reviewed the data as listed    Latest Ref Rng & Units 04/04/2024   10:49 AM 03/28/2024    9:03 AM 02/03/2024   12:06 PM  CMP  Glucose 70 - 99 mg/dL 95  81  89   BUN 8 - 23 mg/dL 10  13  22    Creatinine 0.44 - 1.00 mg/dL 9.30  9.28  9.27   Sodium 135 - 145 mmol/L 141  138  138   Potassium 3.5 - 5.1 mmol/L 4.8  4.1  4.9   Chloride 98 - 111 mmol/L 108  103  104   CO2 22 - 32 mmol/L 28  28  29    Calcium 8.9 - 10.3 mg/dL 89.4  88.2  89.5   Total Protein 6.5 - 8.1 g/dL 7.5  8.1  7.9   Total Bilirubin  0.0 - 1.2 mg/dL 0.6  0.5  0.5   Alkaline Phos 38 - 126 U/L 81  97  82   AST 15 - 41 U/L 20  18  15    ALT 0 - 44 U/L 14  9  16      Lab Results  Component Value Date   WBC 4.0 04/19/2024   HGB 12.5 04/19/2024   HCT 40.8 04/19/2024   MCV 84.1 04/19/2024   PLT 308 04/19/2024   NEUTROABS 1.4 (L) 04/19/2024    ASSESSMENT & PLAN:  Malignant neoplasm of lower-outer quadrant of left breast of female, estrogen receptor negative (HCC) 01/09/24: Palpable left breast mass: 1.6 cm 5 o'clock position, axilla negative, biopsy: Grade 2 IDC ER 0%, PR 0%, Ki67 30%,  HER2 0   Treatment plan: 02/21/2024: Left lumpectomy: Grade 2 IDC 2.2 cm, margins negative, ER 0%, PR 0%, HER2 negative, Ki67 30%, 0/4 lymph nodes negative Adjuvant chemotherapy with CMF x 6 cycles Adjuvant radiation therapy ------------------------------------------------------------------------------------------------------------------------------------------ Current Treatment: Cycle 2 CMF Chemo toxicities: Fatigue: Lasted a whole week Leukopenia/neutropenia: ANC 1.1 at the start of chemo.  Today's ANC is still 1.4. Mild anemia Constipation: Encouraged her to take stool softeners regularly and then laxatives as needed Urinary incontinence x 3 episodes: She thinks it is related to steroid use.  I discontinued steroids.   Port related discomfort in the right arm and into the neck: It should improve over the next week. Return to clinic in 3 weeks for cycle 3   No orders of the defined types were placed in this encounter.  The patient has a good understanding of the overall plan. she agrees with it. she will call with any problems that may develop before the next visit here.  I personally spent a total of 30 minutes in the care of the patient today including preparing to see the patient, getting/reviewing separately obtained history, performing a medically appropriate exam/evaluation, counseling and educating, placing orders, referring and  communicating with other health care professionals, documenting clinical information in the EHR, independently interpreting results, communicating results, and coordinating care.   Viinay K Trek Kimball, MD 04/19/24

## 2024-04-22 ENCOUNTER — Other Ambulatory Visit: Payer: Self-pay

## 2024-04-27 ENCOUNTER — Encounter: Payer: Self-pay | Admitting: Hematology and Oncology

## 2024-04-30 ENCOUNTER — Encounter: Payer: Self-pay | Admitting: Hematology and Oncology

## 2024-05-07 ENCOUNTER — Ambulatory Visit

## 2024-05-10 ENCOUNTER — Inpatient Hospital Stay: Attending: Hematology and Oncology

## 2024-05-10 ENCOUNTER — Encounter: Payer: Self-pay | Admitting: *Deleted

## 2024-05-10 ENCOUNTER — Inpatient Hospital Stay: Admitting: Hematology and Oncology

## 2024-05-10 ENCOUNTER — Other Ambulatory Visit: Payer: Self-pay | Admitting: Pharmacist

## 2024-05-10 ENCOUNTER — Other Ambulatory Visit: Payer: Self-pay | Admitting: *Deleted

## 2024-05-10 ENCOUNTER — Inpatient Hospital Stay

## 2024-05-10 VITALS — BP 128/78 | HR 99 | Temp 97.3°F | Resp 18 | Ht 66.0 in | Wt 160.1 lb

## 2024-05-10 DIAGNOSIS — K5903 Drug induced constipation: Secondary | ICD-10-CM | POA: Insufficient documentation

## 2024-05-10 DIAGNOSIS — Z5111 Encounter for antineoplastic chemotherapy: Secondary | ICD-10-CM | POA: Insufficient documentation

## 2024-05-10 DIAGNOSIS — D701 Agranulocytosis secondary to cancer chemotherapy: Secondary | ICD-10-CM | POA: Insufficient documentation

## 2024-05-10 DIAGNOSIS — Z171 Estrogen receptor negative status [ER-]: Secondary | ICD-10-CM

## 2024-05-10 DIAGNOSIS — D702 Other drug-induced agranulocytosis: Secondary | ICD-10-CM | POA: Diagnosis not present

## 2024-05-10 DIAGNOSIS — C50512 Malignant neoplasm of lower-outer quadrant of left female breast: Secondary | ICD-10-CM

## 2024-05-10 DIAGNOSIS — R110 Nausea: Secondary | ICD-10-CM | POA: Diagnosis not present

## 2024-05-10 LAB — CMP (CANCER CENTER ONLY)
ALT: 9 U/L (ref 0–44)
AST: 21 U/L (ref 15–41)
Albumin: 4.5 g/dL (ref 3.5–5.0)
Alkaline Phosphatase: 115 U/L (ref 38–126)
Anion gap: 8 (ref 5–15)
BUN: 13 mg/dL (ref 8–23)
CO2: 26 mmol/L (ref 22–32)
Calcium: 10.3 mg/dL (ref 8.9–10.3)
Chloride: 104 mmol/L (ref 98–111)
Creatinine: 0.64 mg/dL (ref 0.44–1.00)
GFR, Estimated: >60 mL/min (ref >60)
Glucose, Bld: 100 mg/dL — ABNORMAL HIGH (ref 70–99)
Potassium: 4.4 mmol/L (ref 3.5–5.1)
Sodium: 138 mmol/L (ref 135–145)
Total Bilirubin: 0.3 mg/dL (ref 0.0–1.2)
Total Protein: 7.6 g/dL (ref 6.5–8.1)

## 2024-05-10 LAB — CBC WITH DIFFERENTIAL (CANCER CENTER ONLY)
Abs Immature Granulocytes: 0.01 K/uL (ref 0.00–0.07)
Basophils Absolute: 0 K/uL (ref 0.0–0.1)
Basophils Relative: 0 %
Eosinophils Absolute: 0 K/uL (ref 0.0–0.5)
Eosinophils Relative: 0 %
HCT: 36.5 % (ref 36.0–46.0)
Hemoglobin: 11.3 g/dL — ABNORMAL LOW (ref 12.0–15.0)
Immature Granulocytes: 0 %
Lymphocytes Relative: 46 %
Lymphs Abs: 1.5 K/uL (ref 0.7–4.0)
MCH: 25.5 pg — ABNORMAL LOW (ref 26.0–34.0)
MCHC: 31 g/dL (ref 30.0–36.0)
MCV: 82.2 fL (ref 80.0–100.0)
Monocytes Absolute: 0.9 K/uL (ref 0.1–1.0)
Monocytes Relative: 29 %
Neutro Abs: 0.8 K/uL — ABNORMAL LOW (ref 1.7–7.7)
Neutrophils Relative %: 25 %
Platelet Count: 236 K/uL (ref 150–400)
RBC: 4.44 MIL/uL (ref 3.87–5.11)
RDW: 15.5 % (ref 11.5–15.5)
WBC Count: 3.2 K/uL — ABNORMAL LOW (ref 4.0–10.5)
nRBC: 0 % (ref 0.0–0.2)

## 2024-05-10 MED ORDER — FILGRASTIM 300 MCG/0.5ML IJ SOSY: 300.0000 ug | PREFILLED_SYRINGE | Freq: Every day | INTRAMUSCULAR | Status: DC

## 2024-05-10 MED ORDER — LIDOCAINE-PRILOCAINE 2.5-2.5 % EX CREA: 1.0000 | TOPICAL_CREAM | Freq: Once | CUTANEOUS | 1 refills | Status: AC

## 2024-05-10 MED ORDER — FILGRASTIM-SNDZ 300 MCG/0.5ML IJ SOSY
300.0000 ug | PREFILLED_SYRINGE | Freq: Once | INTRAMUSCULAR | Status: AC
  Administered 2024-05-10: 300 ug via SUBCUTANEOUS
  Filled 2024-05-10: qty 0.5

## 2024-05-10 NOTE — Patient Instructions (Signed)
 Filgrastim Injection What is this medication? FILGRASTIM (fil GRA stim) lowers the risk of infection in people who are receiving chemotherapy. It works by Systems analyst make more white blood cells, which protects your body from infection. It may also be used to help people who have been exposed to high doses of radiation. It can be used to help prepare your body before a stem cell transplant. It works by helping your bone marrow make and release stem cells into the blood. This medicine may be used for other purposes; ask your health care provider or pharmacist if you have questions. COMMON BRAND NAME(S): Neupogen, Nivestym, Nypozi, Releuko, Zarxio What should I tell my care team before I take this medication? They need to know if you have any of these conditions: History of blood diseases, such as sickle cell anemia Kidney disease Recent or ongoing radiation An unusual or allergic reaction to filgrastim, pegfilgrastim, latex, rubber, other medications, foods, dyes, or preservatives Pregnant or trying to get pregnant Breast-feeding How should I use this medication? This medication is injected under the skin or into a vein. It is usually given by your care team in a hospital or clinic setting. It may be given at home. If you get this medication at home, you will be taught how to prepare and give it. Use exactly as directed. Take it as directed on the prescription label at the same time every day. Keep taking it unless your care team tells you to stop. It is important that you put your used needles and syringes in a special sharps container. Do not put them in a trash can. If you do not have a sharps container, call your pharmacist or care team to get one. This medication comes with INSTRUCTIONS FOR USE. Ask your pharmacist for directions on how to use this medication. Read the information carefully. Talk to your pharmacist or care team if you have questions. Talk to your care team about the use of  this medication in children. While it may be prescribed for children for selected conditions, precautions do apply. Overdosage: If you think you have taken too much of this medicine contact a poison control center or emergency room at once. NOTE: This medicine is only for you. Do not share this medicine with others. What if I miss a dose? It is important not to miss any doses. Talk to your care team about what to do if you miss a dose. What may interact with this medication? Medications that may cause a release of neutrophils, such as lithium This list may not describe all possible interactions. Give your health care provider a list of all the medicines, herbs, non-prescription drugs, or dietary supplements you use. Also tell them if you smoke, drink alcohol, or use illegal drugs. Some items may interact with your medicine. What should I watch for while using this medication? Your condition will be monitored carefully while you are receiving this medication. You may need bloodwork while taking this medication. Talk to your care team about your risk of cancer. You may be more at risk for certain types of cancer if you take this medication. What side effects may I notice from receiving this medication? Side effects that you should report to your care team as soon as possible: Allergic reactions--skin rash, itching, hives, swelling of the face, lips, tongue, or throat Capillary leak syndrome--stomach or muscle pain, unusual weakness or fatigue, feeling faint or lightheaded, decrease in the amount of urine, swelling of the ankles, hands,  or feet, trouble breathing High white blood cell level--fever, fatigue, trouble breathing, night sweats, change in vision, weight loss Inflammation of the aorta--fever, fatigue, back, chest, or stomach pain, severe headache Kidney injury (glomerulonephritis)--decrease in the amount of urine, red or dark brown urine, foamy or bubbly urine, swelling of the ankles, hands,  or feet Shortness of breath or trouble breathing Spleen injury--pain in upper left stomach or shoulder Unusual bruising or bleeding Side effects that usually do not require medical attention (report to your care team if they continue or are bothersome): Back pain Bone pain Fatigue Fever Headache Nausea This list may not describe all possible side effects. Call your doctor for medical advice about side effects. You may report side effects to FDA at 1-800-FDA-1088. Where should I keep my medication? Keep out of the reach of children and pets. Keep this medication in the original packaging until you are ready to take it. Protect from light. See product for storage information. Each product may have different instructions. Get rid of any unused medication after the expiration date. To get rid of medications that are no longer needed or have expired: Take the medication to a medications take-back program. Check with your pharmacy or law enforcement to find a location. If you cannot return the medication, ask your pharmacist or care team how to get rid of this medication safely. NOTE: This sheet is a summary. It may not cover all possible information. If you have questions about this medicine, talk to your doctor, pharmacist, or health care provider.  2024 Elsevier/Gold Standard (2021-10-15 00:00:00)

## 2024-05-10 NOTE — Progress Notes (Signed)
 Patient Care Team: Theo Iha, MD as PCP - General (Internal Medicine) Rutherford Gain, MD as Consulting Physician (Obstetrics and Gynecology) Theo Iha, MD (Internal Medicine) Tyree Nanetta SAILOR, RN as Oncology Nurse Navigator Gerome, Devere HERO, RN as Oncology Nurse Navigator [name] Cough, MD as Consulting Physician (General Surgery) Odean Potts, MD as Consulting Physician (Hematology and Oncology) Izell Domino, MD as Attending Physician (Radiation Oncology)  DIAGNOSIS:  No diagnosis found.   SUMMARY OF ONCOLOGIC HISTORY: Oncology History  Malignant neoplasm of lower-outer quadrant of left breast of female, estrogen receptor negative (HCC)  01/09/2024 Initial Diagnosis   Palpable left breast mass: 1.6 cm 5 o'clock position, axilla negative, biopsy: Grade 2 IDC ER 0%, PR 0%, Ki67 30%, HER2 0   02/01/2024 Cancer Staging   Staging form: Breast, AJCC 8th Edition - Clinical: Stage IB (cT1c, cN0, cM0, G2, ER-, PR-, HER2-) - Signed by Odean Potts, MD on 02/01/2024 Stage prefix: Initial diagnosis Histologic grading system: 3 grade system   02/21/2024 Surgery   Left lumpectomy: Grade 2 IDC 2.2 cm, margins negative, ER 0%, PR 0%, HER2 negative, Ki67 30%, 0/4 lymph nodes negative   03/28/2024 -  Chemotherapy   Patient is on Treatment Plan : BREAST CMF IV q21d       CHIEF COMPLIANT: Cycle 2 CMF  HISTORY OF PRESENT ILLNESS:  History of Present Illness  [name] is a 78 year old female undergoing chemotherapy who presents with low blood counts.  She is currently undergoing chemotherapy and has experienced increased side effects during and after her last session due to initially avoiding steroids. Steroids have been reintroduced, which has improved her symptoms of nausea and fluid retention.  Her current blood work shows an absolute neutrophil count of 800. Previously, her insurance denied coverage for booster shots to address this issue, but she has  now agreed to approve it after requiring proof of her condition.  No fevers, chills, or signs of infection. She mentions being 'cold natured' but attributes it to the weather.  Rest of the pertinent 10 point ROS reviewed and neg.  ALLERGIES:  has no active allergies.  MEDICATIONS:  Current Outpatient Medications  Medication Sig Dispense Refill   acetaminophen  (TYLENOL ) 500 MG tablet Take 1,000 mg by mouth every 6 (six) hours as needed for moderate pain. (Patient not taking: Reported on 03/28/2024)     Ascorbic Acid (VITAMIN C) 1000 MG tablet Take 1,000 mg by mouth daily. (Patient not taking: Reported on 03/28/2024)     b complex vitamins tablet Take 1 tablet by mouth daily. (Patient not taking: Reported on 03/28/2024)     BLACK CURRANT SEED OIL PO Take by mouth. (Patient not taking: Reported on 03/28/2024)     Cholecalciferol (VITAMIN D3) 50 MCG (2000 UT) capsule Vitamin D3 (Patient not taking: Reported on 03/28/2024)     dexamethasone  (DECADRON ) 4 MG tablet Take 1 tablet (4 mg total) by mouth daily. Start the day after chemotherapy for 2 days. Take with food. (Patient not taking: Reported on 03/28/2024) 12 tablet 0   Ginkgo Biloba (GINKOBA PO) Take 1 tablet by mouth daily. (Patient not taking: Reported on 03/28/2024)     Glucosamine HCl (GLUCOSAMINE PO) Take 3 capsules by mouth daily. (Patient not taking: Reported on 03/28/2024)     Iron-Vitamin C (IRON 100/C) 100-250 MG TABS Take 1 capsule by mouth daily.  (Patient not taking: Reported on 03/28/2024)     Lotilaner (XDEMVY) 0.25 % SOLN Apply to eye. (Patient not taking: Reported on  03/28/2024)     MAGNESIUM GLYCINATE PLUS PO Take 400 mg by mouth daily.  (Patient not taking: Reported on 03/28/2024)     ondansetron  (ZOFRAN ) 8 MG tablet Take 1 tablet (8 mg total) by mouth every 8 (eight) hours as needed for nausea or vomiting. Start on the third day after chemotherapy. (Patient not taking: Reported on 03/28/2024) 30 tablet 1   Polyvinyl  Alcohol-Povidone (REFRESH OP) Apply 1 drop to eye daily as needed (dry eyes). (Patient not taking: Reported on 03/28/2024)     Probiotic Product (PROBIOTIC & ACIDOPHILUS EX ST PO) Take 1 tablet by mouth daily.  (Patient not taking: Reported on 03/28/2024)     prochlorperazine  (COMPAZINE ) 10 MG tablet Take 1 tablet (10 mg total) by mouth every 6 (six) hours as needed for nausea or vomiting. (Patient not taking: Reported on 03/28/2024) 30 tablet 1   Protein POWD Take 1 scoop by mouth daily. Mix with water (Patient not taking: Reported on 03/28/2024)     traMADol  (ULTRAM ) 50 MG tablet Take 1 tablet (50 mg total) by mouth every 6 (six) hours as needed. (Patient not taking: Reported on 03/28/2024) 10 tablet 0   trimethoprim-polymyxin b (POLYTRIM) ophthalmic solution every 4 (four) hours. (Patient not taking: Reported on 03/28/2024)     No current facility-administered medications for this visit.    PHYSICAL EXAMINATION: ECOG PERFORMANCE STATUS: 1 - Symptomatic but completely ambulatory  Vitals:   05/10/24 1355  BP: 128/78  Pulse: 99  Resp: 18  Temp: (!) 97.3 F (36.3 C)  SpO2: (!) 72%   Filed Weights   05/10/24 1355  Weight: 160 lb 1.6 oz (72.6 kg)    Physical Exam   GENERAL: Alert, cooperative, well developed, no acute distress. HEENT: Normocephalic, normal oropharynx, moist mucous membranes. CHEST: Clear to auscultation bilaterally, no wheezes, rhonchi, or crackles. CARDIOVASCULAR: Normal heart rate and rhythm, S1 and S2 normal without murmurs. ABDOMEN: Soft, non-tender, non-distended, without organomegaly, normal bowel sounds. EXTREMITIES: No cyanosis or edema.   LABORATORY DATA:  I have reviewed the data as listed    Latest Ref Rng & Units 04/19/2024    2:35 PM 04/04/2024   10:49 AM 03/28/2024    9:03 AM  CMP  Glucose 70 - 99 mg/dL 71  95  81   BUN 8 - 23 mg/dL 10  10  13    Creatinine 0.44 - 1.00 mg/dL 9.36  9.30  9.28   Sodium 135 - 145 mmol/L 139  141  138    Potassium 3.5 - 5.1 mmol/L 4.2  4.8  4.1   Chloride 98 - 111 mmol/L 105  108  103   CO2 22 - 32 mmol/L 25  28  28    Calcium 8.9 - 10.3 mg/dL 89.0  89.4  88.2   Total Protein 6.5 - 8.1 g/dL 8.4  7.5  8.1   Total Bilirubin 0.0 - 1.2 mg/dL 0.4  0.6  0.5   Alkaline Phos 38 - 126 U/L 116  81  97   AST 15 - 41 U/L 20  20  18    ALT 0 - 44 U/L 11  14  9      Lab Results  Component Value Date   WBC 3.2 (L) 05/10/2024   HGB 11.3 (L) 05/10/2024   HCT 36.5 05/10/2024   MCV 82.2 05/10/2024   PLT 236 05/10/2024   NEUTROABS 0.8 (L) 05/10/2024    ASSESSMENT & PLAN:  Malignant neoplasm of lower-outer quadrant of left breast of female,  estrogen receptor negative (HCC) 01/09/24: Palpable left breast mass: 1.6 cm 5 o'clock position, axilla negative, biopsy: Grade 2 IDC ER 0%, PR 0%, Ki67 30%, HER2 0   Treatment plan: 02/21/2024: Left lumpectomy: Grade 2 IDC 2.2 cm, margins negative, ER 0%, PR 0%, HER2 negative, Ki67 30%, 0/4 lymph nodes negative Adjuvant chemotherapy with CMF x 6 cycles Adjuvant radiation therapy ------------------------------------------------------------------------------------------------------------------------------------------ Current Treatment: Cycle 3 CMF  Assessment and Plan Assessment & Plan Adjuvant chemotherapy management for breast cancer Undergoing adjuvant chemotherapy post-surgery. Current cycle will have to be delayed due to neutropenia. - Delay chemotherapy by one week due to neutropenia. - Neupogen daily for 3 days.  Chemotherapy-induced neutropenia Experiencing neutropenia with ANC of 800.  - Submitted request for insurance approval for Neulasta.  She will RTC to see Dr Odean.  No orders of the defined types were placed in this encounter.  The patient has a good understanding of the overall plan. she agrees with it. she will call with any problems that may develop before the next visit here.    Amber Stalls, MD 05/10/24

## 2024-05-11 ENCOUNTER — Inpatient Hospital Stay

## 2024-05-11 ENCOUNTER — Other Ambulatory Visit: Payer: Self-pay

## 2024-05-11 ENCOUNTER — Encounter: Payer: Self-pay | Admitting: Hematology and Oncology

## 2024-05-11 VITALS — BP 121/74 | HR 76 | Temp 98.0°F | Resp 17

## 2024-05-11 DIAGNOSIS — C50512 Malignant neoplasm of lower-outer quadrant of left female breast: Secondary | ICD-10-CM

## 2024-05-11 DIAGNOSIS — Z5111 Encounter for antineoplastic chemotherapy: Secondary | ICD-10-CM | POA: Diagnosis not present

## 2024-05-11 MED ORDER — FILGRASTIM-SNDZ 300 MCG/0.5ML IJ SOSY
300.0000 ug | PREFILLED_SYRINGE | Freq: Once | INTRAMUSCULAR | Status: AC
  Administered 2024-05-11: 300 ug via SUBCUTANEOUS
  Filled 2024-05-11: qty 0.5

## 2024-05-12 ENCOUNTER — Inpatient Hospital Stay

## 2024-05-12 VITALS — BP 131/68 | HR 74 | Temp 98.0°F | Resp 16

## 2024-05-12 DIAGNOSIS — Z5111 Encounter for antineoplastic chemotherapy: Secondary | ICD-10-CM | POA: Diagnosis not present

## 2024-05-12 DIAGNOSIS — Z171 Estrogen receptor negative status [ER-]: Secondary | ICD-10-CM

## 2024-05-12 MED ORDER — FILGRASTIM-SNDZ 300 MCG/0.5ML IJ SOSY
300.0000 ug | PREFILLED_SYRINGE | Freq: Once | INTRAMUSCULAR | Status: AC
  Administered 2024-05-12: 300 ug via SUBCUTANEOUS
  Filled 2024-05-12: qty 0.5

## 2024-05-17 ENCOUNTER — Inpatient Hospital Stay: Admitting: Adult Health

## 2024-05-17 ENCOUNTER — Inpatient Hospital Stay

## 2024-05-17 ENCOUNTER — Encounter: Payer: Self-pay | Admitting: Adult Health

## 2024-05-17 VITALS — BP 122/69 | HR 71 | Temp 97.6°F | Resp 18 | Wt 159.6 lb

## 2024-05-17 DIAGNOSIS — Z171 Estrogen receptor negative status [ER-]: Secondary | ICD-10-CM | POA: Diagnosis not present

## 2024-05-17 DIAGNOSIS — C50512 Malignant neoplasm of lower-outer quadrant of left female breast: Secondary | ICD-10-CM

## 2024-05-17 DIAGNOSIS — Z5111 Encounter for antineoplastic chemotherapy: Secondary | ICD-10-CM | POA: Diagnosis not present

## 2024-05-17 LAB — CBC WITH DIFFERENTIAL (CANCER CENTER ONLY)
Abs Immature Granulocytes: 0.08 K/uL — ABNORMAL HIGH (ref 0.00–0.07)
Basophils Absolute: 0 K/uL (ref 0.0–0.1)
Basophils Relative: 1 %
Eosinophils Absolute: 0 K/uL (ref 0.0–0.5)
Eosinophils Relative: 0 %
HCT: 37.9 % (ref 36.0–46.0)
Hemoglobin: 11.8 g/dL — ABNORMAL LOW (ref 12.0–15.0)
Immature Granulocytes: 2 %
Lymphocytes Relative: 37 %
Lymphs Abs: 1.6 K/uL (ref 0.7–4.0)
MCH: 25.4 pg — ABNORMAL LOW (ref 26.0–34.0)
MCHC: 31.1 g/dL (ref 30.0–36.0)
MCV: 81.7 fL (ref 80.0–100.0)
Monocytes Absolute: 1.1 K/uL — ABNORMAL HIGH (ref 0.1–1.0)
Monocytes Relative: 24 %
Neutro Abs: 1.6 K/uL — ABNORMAL LOW (ref 1.7–7.7)
Neutrophils Relative %: 36 %
Platelet Count: 141 K/uL — ABNORMAL LOW (ref 150–400)
RBC: 4.64 MIL/uL (ref 3.87–5.11)
RDW: 16.3 % — ABNORMAL HIGH (ref 11.5–15.5)
WBC Count: 4.4 K/uL (ref 4.0–10.5)
nRBC: 0 % (ref 0.0–0.2)

## 2024-05-17 LAB — CMP (CANCER CENTER ONLY)
ALT: 15 U/L (ref 0–44)
AST: 27 U/L (ref 15–41)
Albumin: 4.3 g/dL (ref 3.5–5.0)
Alkaline Phosphatase: 137 U/L — ABNORMAL HIGH (ref 38–126)
Anion gap: 10 (ref 5–15)
BUN: 17 mg/dL (ref 8–23)
CO2: 23 mmol/L (ref 22–32)
Calcium: 10 mg/dL (ref 8.9–10.3)
Chloride: 107 mmol/L (ref 98–111)
Creatinine: 0.7 mg/dL (ref 0.44–1.00)
GFR, Estimated: >60 mL/min (ref >60)
Glucose, Bld: 105 mg/dL — ABNORMAL HIGH (ref 70–99)
Potassium: 4.5 mmol/L (ref 3.5–5.1)
Sodium: 140 mmol/L (ref 135–145)
Total Bilirubin: 0.3 mg/dL (ref 0.0–1.2)
Total Protein: 7.7 g/dL (ref 6.5–8.1)

## 2024-05-17 MED ORDER — METHOTREXATE SODIUM CHEMO INJECTION (PF) 50 MG/2ML
30.0000 mg/m2 | Freq: Once | INTRAMUSCULAR | Status: AC
  Administered 2024-05-17: 55 mg via INTRAVENOUS
  Filled 2024-05-17: qty 2.2

## 2024-05-17 MED ORDER — FLUOROURACIL CHEMO INJECTION 2.5 GM/50ML
400.0000 mg/m2 | Freq: Once | INTRAVENOUS | Status: AC
  Administered 2024-05-17: 750 mg via INTRAVENOUS
  Filled 2024-05-17: qty 15

## 2024-05-17 MED ORDER — SODIUM CHLORIDE 0.9% FLUSH
10.0000 mL | INTRAVENOUS | Status: DC | PRN
  Administered 2024-05-17: 10 mL

## 2024-05-17 MED ORDER — SODIUM CHLORIDE 0.9 % IV SOLN
500.0000 mg/m2 | Freq: Once | INTRAVENOUS | Status: AC
  Administered 2024-05-17: 1000 mg via INTRAVENOUS
  Filled 2024-05-17: qty 50

## 2024-05-17 MED ORDER — SODIUM CHLORIDE 0.9 % IV SOLN: INTRAVENOUS | Status: DC

## 2024-05-17 MED ORDER — PALONOSETRON HCL INJECTION 0.25 MG/5ML
0.2500 mg | Freq: Once | INTRAVENOUS | Status: AC
  Administered 2024-05-17: 0.25 mg via INTRAVENOUS
  Filled 2024-05-17: qty 5

## 2024-05-17 MED ADMIN — Dexamethasone Sod Phosphate Preservative Free Inj 10 MG/ML: 10 mg | INTRAVENOUS | NDC 00641036721

## 2024-05-17 NOTE — Patient Instructions (Signed)
 [facility] - A DEPT OF [facility]  Discharge Instructions: Thank you for choosing [facility] to provide your oncology and hematology care.   If you have a lab appointment with the Cancer Center, please go directly to the Cancer Center and check in at the registration area.   Wear comfortable clothing and clothing appropriate for easy access to any Portacath or PICC line.   We strive to give you quality time with your provider. You may need to reschedule your appointment if you arrive late (15 or more minutes).  Arriving late affects you and other patients whose appointments are after yours.  Also, if you miss three or more appointments without notifying the office, you may be dismissed from the clinic at the provider's discretion.      For prescription refill requests, have your pharmacy contact our office and allow 72 hours for refills to be completed.    Today you received the following chemotherapy and/or immunotherapy agents: Cyclophosphamide  (Cytoxan ), Methotrexate  (PF), & Fluorouracil  (Adrucil )    To help prevent nausea and vomiting after your treatment, we encourage you to take your nausea medication as directed.  BELOW ARE SYMPTOMS THAT SHOULD BE REPORTED IMMEDIATELY: *FEVER GREATER THAN 100.4 F (38 C) OR HIGHER *CHILLS OR SWEATING *NAUSEA AND VOMITING THAT IS NOT CONTROLLED WITH YOUR NAUSEA MEDICATION *UNUSUAL SHORTNESS OF BREATH *UNUSUAL BRUISING OR BLEEDING *URINARY PROBLEMS (pain or burning when urinating, or frequent urination) *BOWEL PROBLEMS (unusual diarrhea, constipation, pain near the anus) TENDERNESS IN MOUTH AND THROAT WITH OR WITHOUT PRESENCE OF ULCERS (sore throat, sores in mouth, or a toothache) UNUSUAL RASH, SWELLING OR PAIN  UNUSUAL VAGINAL DISCHARGE OR ITCHING   Items with * indicate a potential emergency and should be followed up as soon as possible or go to the Emergency Department if any problems should  occur.  Please show the CHEMOTHERAPY ALERT CARD or IMMUNOTHERAPY ALERT CARD at check-in to the Emergency Department and triage nurse.  Should you have questions after your visit or need to cancel or reschedule your appointment, please contact [facility] - A DEPT OF [facility]  Dept: [phone]  and follow the prompts.  Office hours are 8:00 a.m. to 4:30 p.m. Monday - Friday. Please note that voicemails left after 4:00 p.m. may not be returned until the following business day.  We are closed weekends and major holidays. You have access to a nurse at all times for urgent questions. Please call the main number to the clinic Dept: [phone] and follow the prompts.   For any non-urgent questions, you may also contact your provider using MyChart. We now offer e-Visits for anyone 54 and older to request care online for non-urgent symptoms. For details visit [email or web address].   Also download the MyChart app! Go to the app store, search MyChart, open the app, select [facility], and log in with your MyChart username and password.

## 2024-05-17 NOTE — Progress Notes (Signed)
 [facility] Cancer Follow up:    Theo Iha, MD [address] [place] [address]   DIAGNOSIS:  Cancer Staging  Malignant neoplasm of lower-outer quadrant of left breast of female, estrogen receptor negative (HCC) Staging form: Breast, AJCC 8th Edition - Clinical: Stage IB (cT1c, cN0, cM0, G2, ER-, PR-, HER2-) - Signed by Odean Potts, MD on 02/01/2024 Stage prefix: Initial diagnosis Histologic grading system: 3 grade system    SUMMARY OF ONCOLOGIC HISTORY: Oncology History  Malignant neoplasm of lower-outer quadrant of left breast of female, estrogen receptor negative (HCC)  01/09/2024 Initial Diagnosis   Palpable left breast mass: 1.6 cm 5 o'clock position, axilla negative, biopsy: Grade 2 IDC ER 0%, PR 0%, Ki67 30%, HER2 0   02/01/2024 Cancer Staging   Staging form: Breast, AJCC 8th Edition - Clinical: Stage IB (cT1c, cN0, cM0, G2, ER-, PR-, HER2-) - Signed by Odean Potts, MD on 02/01/2024 Stage prefix: Initial diagnosis Histologic grading system: 3 grade system   02/21/2024 Surgery   Left lumpectomy: Grade 2 IDC 2.2 cm, margins negative, ER 0%, PR 0%, HER2 negative, Ki67 30%, 0/4 lymph nodes negative   03/28/2024 -  Chemotherapy   Patient is on Treatment Plan : BREAST CMF IV q21d       CURRENT THERAPY: CMF  INTERVAL HISTORY:  Discussed the use of AI scribe software for clinical note transcription with the patient, who gave verbal consent to proceed.  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  She was diagnosed in August 2025 and underwent lumpectomy. She is on adjuvant CMF and presents for the third cycle. During cycle 2, dexamethasone  was omitted due to pharmacy limitations and she had a significant reaction that required an alternative steroid. She then developed urinary  incontinence with steroid use but prefers to continue dexamethasone  10 mg in her regimen. She denies breast pain, mass, or discharge.  She had chemotherapy-induced neutropenia that caused a recent treatment delay. She receives three days of Zarxio  after chemotherapy. Her most recent labs show white blood cell count 4.4 and neutrophils 1.6, which are acceptable for treatment. She has received short-acting G-CSF boosters and is scheduled for her first long-acting booster. She denies abnormal bleeding or bruising.  She follows her antiemetic regimen and her nausea is well controlled with two days of anti-nausea medications after chemotherapy. She denies vomiting or abdominal pain. Claritin 10 mg daily was added for the booster, and she uses acetaminophen  as needed.  She has chemotherapy-induced constipation managed with MiraLAX and Colace. She prefers not to use daily MiraLAX and adjusts dosing based on response. She maintains hydration and is concerned about overuse of stool softeners, and she denies abdominal pain.  She denies cough, dyspnea, or pain and is able to sit up and ambulate as needed. She mentions a sensation of scar tissue at the site of prior nasal surgery.     Patient Active Problem List   Diagnosis Date Noted   Malignant neoplasm of lower-outer quadrant of left breast of female, estrogen receptor negative (HCC) 02/01/2024   Elevated blood pressure reading 10/12/2018   History of pericarditis 10/12/2018   Abnormal laboratory test 03/02/2018   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   Chest pain 04/09/2014   Vertigo 08/07/2013   Head pain    Concussion 05/11/2013   Headache 05/11/2013    has no  active allergies.  MEDICAL HISTORY: Past Medical History:  Diagnosis Date   Abnormal laboratory test 03/02/2018   Anemia    due to GI bleed   Arthritis    bilateral knees   Chest pain    Complication of anesthesia    see note about TIA-32 yrs ago   Concussion 05/11/2013   Dizziness    ECHO  and Stress test all normal 04/2015   Head pain 07/25/2015   recent blow to head   History of concussion    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   TIA (transient ischemic attack) 1988   occurred three days after anesthesia   Vertigo 08/07/2013    SURGICAL HISTORY: Past Surgical History:  Procedure Laterality Date   APPENDECTOMY     ARTERY BIOPSY Left 03/09/2018   Procedure: BIOPSY LEFT TEMPORAL ARTERY;  Surgeon: Kimble Agent, MD;  Location: [facility];  Service: General;  Laterality: Left;  MAC   BREAST BIOPSY Left 01/09/2024   US  LT BREAST BX W LOC DEV 1ST LESION IMG BX SPEC US  GUIDE 01/09/2024 [facility]MOGRAP[facility]   BREAST BIOPSY Left 02/17/2024   US  LT RADIOACTIVE SEED LOC 02/17/2024 [facility] MAMMOGRAPHY   BREAST LUMPECTOMY WITH RADIOACTIVE SEED AND SENTINEL LYMPH NODE BIOPSY Left 02/21/2024   Procedure: BREAST LUMPECTOMY WITH RADIOACTIVE SEED AND SENTINEL LYMPH NODE BIOPSY;  Surgeon: [name] Cough, MD;  Location: [facility];  Service: General;  Laterality: Left;  LMA LEFT BREAST SEED GUIDED LUMPECTOMY LEFT AXILLARY SENTINEL NODE BIOPSY   CERVICAL CONE BIOPSY     COLONOSCOPY     CYSTOCELE REPAIR     DILATATION & CURETTAGE/HYSTEROSCOPY WITH MYOSURE N/A 08/08/2015   Procedure: DILATATION & CURETTAGE/HYSTEROSCOPY WITH MYOSURE;  Surgeon: Dickie Carder, MD;  Location: [facility];  Service: Gynecology;  Laterality: N/A;   IR IMAGING GUIDED PORT INSERTION  04/10/2024   TONSILLECTOMY     TUBAL LIGATION      SOCIAL HISTORY: Social History   Socioeconomic History   Marital status: Married    Spouse name: Therapist, Occupational   Number of children: 6   Years of education: 19   Highest education level: Master's degree (e.g., MA, MS, MEng, MEd, MSW, MBA)  Occupational History   Occupation: Retired  Tobacco Use   Smoking status: Never   Smokeless tobacco: Never  Vaping Use   Vaping status: Never Used  Substance and Sexual Activity   Alcohol use: No   Drug use: No   Sexual  activity: Not Currently    Birth control/protection: Post-menopausal  Other Topics Concern   Not on file  Social History Narrative   Patient is married Conservation Officer, Historic Buildings) and lives at home with her husband and her daughter.   Patient has five living children and one is deceased.   Patient is a retired runner, broadcasting/film/video.   Patient has a Scientist, Water Quality.   Patient is right handed.   Uses very little caffeine.   Social Drivers of Health   Tobacco Use: Low Risk (05/17/2024)   Patient History    Smoking Tobacco Use: Never    Smokeless Tobacco Use: Never    Passive Exposure: Not on file  Financial Resource Strain: Not on file  Food Insecurity: No Food Insecurity (02/01/2024)   Epic    Worried About Programme Researcher, Broadcasting/film/video in the Last Year: Never true    Ran Out of Food in the Last Year: Never true  Transportation Needs: No Transportation Needs (02/01/2024)   Epic    Lack of Transportation (Medical): No  Lack of Transportation (Non-Medical): No  Physical Activity: Not on file  Stress: Not on file  Social Connections: Not on file  Intimate Partner Violence: Not At Risk (02/01/2024)   Epic    Fear of Current or Ex-Partner: No    Emotionally Abused: No    Physically Abused: No    Sexually Abused: No  Depression (PHQ2-9): Low Risk (03/28/2024)   Depression (PHQ2-9)    PHQ-2 Score: 0  Alcohol Screen: Not on file  Housing: Unknown (02/01/2024)   Epic    Unable to Pay for Housing in the Last Year: No    Number of Times Moved in the Last Year: Not on file    Homeless in the Last Year: No  Utilities: Not At Risk (02/01/2024)   Epic    Threatened with loss of utilities: No  Health Literacy: Not on file    FAMILY HISTORY: Family History  Problem Relation Age of Onset   Cancer Mother    Heart disease Father    Heart failure Father    Coronary artery disease Other     Review of Systems  Constitutional:  Negative for appetite change, chills, fatigue, fever and unexpected weight change.  HENT:   Negative  for hearing loss, lump/mass and trouble swallowing.   Eyes:  Negative for eye problems and icterus.  Respiratory:  Negative for chest tightness, cough and shortness of breath.   Cardiovascular:  Negative for chest pain, leg swelling and palpitations.  Gastrointestinal:  Negative for abdominal distention, abdominal pain, constipation, diarrhea, nausea and vomiting.  Endocrine: Negative for hot flashes.  Genitourinary:  Negative for difficulty urinating.   Musculoskeletal:  Negative for arthralgias.  Skin:  Negative for itching and rash.  Neurological:  Negative for dizziness, extremity weakness, headaches and numbness.  Hematological:  Negative for adenopathy. Does not bruise/bleed easily.  Psychiatric/Behavioral:  Negative for depression. The patient is not nervous/anxious.       PHYSICAL EXAMINATION    Vitals:   05/17/24 1021  BP: 122/69  Pulse: 71  Resp: 18  Temp: 97.6 F (36.4 C)  SpO2: 100%    Physical Exam Constitutional:      General: She is not in acute distress.    Appearance: Normal appearance. She is not toxic-appearing.  HENT:     Head: Normocephalic and atraumatic.     Mouth/Throat:     Mouth: Mucous membranes are moist.     Pharynx: Oropharynx is clear. No oropharyngeal exudate or posterior oropharyngeal erythema.  Eyes:     General: No scleral icterus. Cardiovascular:     Rate and Rhythm: Normal rate and regular rhythm.     Pulses: Normal pulses.     Heart sounds: Normal heart sounds.  Pulmonary:     Effort: Pulmonary effort is normal.     Breath sounds: Normal breath sounds.  Abdominal:     General: Abdomen is flat. Bowel sounds are normal. There is no distension.     Palpations: Abdomen is soft.     Tenderness: There is no abdominal tenderness.  Musculoskeletal:        General: No swelling.     Cervical back: Neck supple.  Lymphadenopathy:     Cervical: No cervical adenopathy.  Skin:    General: Skin is warm and dry.     Findings: No rash.   Neurological:     General: No focal deficit present.     Mental Status: She is alert.  Psychiatric:  Mood and Affect: Mood normal.        Behavior: Behavior normal.     LABORATORY DATA:  CBC    Component Value Date/Time   WBC 4.4 05/17/2024 0954   WBC 3.4 (L) 02/18/2019 1805   RBC 4.64 05/17/2024 0954   HGB 11.8 (L) 05/17/2024 0954   HGB 12.6 03/02/2018 1613   HGB 11.2 (L) 11/12/2010 1126   HCT 37.9 05/17/2024 0954   HCT 39.6 04/27/2021 1427   HCT 35.5 11/12/2010 1126   PLT 141 (L) 05/17/2024 0954   PLT 358 03/02/2018 1613   MCV 81.7 05/17/2024 0954   MCV 82 03/02/2018 1613   MCV 82 11/12/2010 1126   MCH 25.4 (L) 05/17/2024 0954   MCHC 31.1 05/17/2024 0954   RDW 16.3 (H) 05/17/2024 0954   RDW 15.3 03/02/2018 1613   RDW 16.2 (H) 11/12/2010 1126   LYMPHSABS 1.6 05/17/2024 0954   LYMPHSABS 1.2 03/02/2018 1613   LYMPHSABS 1.5 11/12/2010 1126   MONOABS 1.1 (H) 05/17/2024 0954   EOSABS 0.0 05/17/2024 0954   EOSABS 0.0 03/02/2018 1613   EOSABS 0.0 11/12/2010 1126   BASOSABS 0.0 05/17/2024 0954   BASOSABS 0.0 03/02/2018 1613   BASOSABS 0.1 11/12/2010 1126    CMP     Component Value Date/Time   NA 140 05/17/2024 0954   K 4.5 05/17/2024 0954   CL 107 05/17/2024 0954   CO2 23 05/17/2024 0954   GLUCOSE 105 (H) 05/17/2024 0954   BUN 17 05/17/2024 0954   CREATININE 0.70 05/17/2024 0954   CALCIUM 10.0 05/17/2024 0954   PROT 7.7 05/17/2024 0954   ALBUMIN 4.3 05/17/2024 0954   AST 27 05/17/2024 0954   ALT 15 05/17/2024 0954   ALKPHOS 137 (H) 05/17/2024 0954   BILITOT 0.3 05/17/2024 0954   GFRNONAA >60 05/17/2024 0954   GFRAA >60 02/18/2019 1805     ASSESSMENT and THERAPY PLAN:   Malignant neoplasm of lower-outer quadrant of left breast of female, estrogen receptor negative (HCC) 01/09/24: Palpable left breast mass: 1.6 cm 5 o'clock position, axilla negative, biopsy: Grade 2 IDC ER 0%, PR 0%, Ki67 30%, HER2 0   Treatment plan: 02/21/2024: Left lumpectomy:  Grade 2 IDC 2.2 cm, margins negative, ER 0%, PR 0%, HER2 negative, Ki67 30%, 0/4 lymph nodes negative Adjuvant chemotherapy with CMF x 6 cycles Adjuvant radiation therapy ------------------------------------------------------------------------------------------------------------------------------------------ Current Treatment: Cycle 3 CMF   Assessment and Plan Assessment & Plan Stage 1B triple negative breast cancer of the left breast, status post lumpectomy, undergoing adjuvant chemotherapy Receiving adjuvant CMF chemotherapy post-lumpectomy, tolerating well. Dexamethasone  reinstated in premedication to prevent adverse reactions.  - Included dexamethasone  10 mg in premedication regimen for all future cycles. - Scheduled next chemotherapy cycle for January 8th. - Provided follow-up appointment in four weeks.  Chemotherapy-induced neutropenia Significant neutropenia post-chemotherapy improved with current labs showing acceptable counts for treatment. Transitioning to long-acting G-CSF for subsequent cycles. Claritin added for her booster. - Administered short-acting G-CSF after previous chemotherapy cycles. - Scheduled first dose of long-acting G-CSF for Saturday following this cycle.  Chemotherapy-induced nausea Nausea managed with antiemetic regimen, no new issues or changes. - Continued current antiemetic regimen for two days after chemotherapy. - Reviewed and confirmed antiemetic medications in her chart.  Constipation related to chemotherapy Managed with MiraLAX and Colace as needed. Prefers not to take MiraLAX daily. Adequate hydration encouraged. Discussed escalation if current regimen insufficient. - Advised to continue MiraLAX and Colace as needed, adjusting dose and frequency based on response. -  Encouraged adequate hydration to support bowel regimen. - Discussed escalation to higher potency agents if current regimen becomes insufficient.  RTC in 2 days for Udenyca  and in 3  weeks for labs, f/u, and next treatment.     All questions were answered. The patient knows to call the clinic with any problems, questions or concerns. We can certainly see the patient much sooner if necessary.  Total encounter time:30 minutes*in face-to-face visit time, chart review, lab review, care coordination, order entry, and documentation of the encounter time.    Morna Kendall, NP 05/21/2024 6:58 AM Medical Oncology and Hematology [facility] [address] [place], [place] [address] Tel. [phone]    Fax. [phone]  *Total Encounter Time as defined by the Centers for Medicare and Medicaid Services includes, in addition to the face-to-face time of a patient visit (documented in the note above) non-face-to-face time: obtaining and reviewing outside history, ordering and reviewing medications, tests or procedures, care coordination (communications with other health care professionals or caregivers) and documentation in the medical record.

## 2024-05-19 ENCOUNTER — Inpatient Hospital Stay

## 2024-05-19 VITALS — BP 115/70 | HR 73 | Temp 97.5°F | Resp 18

## 2024-05-19 DIAGNOSIS — Z5111 Encounter for antineoplastic chemotherapy: Secondary | ICD-10-CM | POA: Diagnosis not present

## 2024-05-19 DIAGNOSIS — Z171 Estrogen receptor negative status [ER-]: Secondary | ICD-10-CM

## 2024-05-19 MED ORDER — PEGFILGRASTIM-CBQV 6 MG/0.6ML ~~LOC~~ SOSY
6.0000 mg | PREFILLED_SYRINGE | Freq: Once | SUBCUTANEOUS | Status: AC
  Administered 2024-05-19: 6 mg via SUBCUTANEOUS
  Filled 2024-05-19: qty 0.6

## 2024-05-21 ENCOUNTER — Encounter: Payer: Self-pay | Admitting: Hematology and Oncology

## 2024-05-21 NOTE — Assessment & Plan Note (Signed)
 01/09/24: Palpable left breast mass: 1.6 cm 5 o'clock position, axilla negative, biopsy: Grade 2 IDC ER 0%, PR 0%, Ki67 30%, HER2 0   Treatment plan: 02/21/2024: Left lumpectomy: Grade 2 IDC 2.2 cm, margins negative, ER 0%, PR 0%, HER2 negative, Ki67 30%, 0/4 lymph nodes negative Adjuvant chemotherapy with CMF x 6 cycles Adjuvant radiation therapy ------------------------------------------------------------------------------------------------------------------------------------------ Current Treatment: Cycle 3 CMF

## 2024-05-26 ENCOUNTER — Inpatient Hospital Stay

## 2024-06-05 ENCOUNTER — Inpatient Hospital Stay

## 2024-06-05 ENCOUNTER — Inpatient Hospital Stay: Admitting: Adult Health

## 2024-06-07 ENCOUNTER — Encounter: Payer: Self-pay | Admitting: Hematology and Oncology

## 2024-06-13 ENCOUNTER — Encounter: Payer: Self-pay | Admitting: Hematology and Oncology

## 2024-06-13 NOTE — Progress Notes (Signed)
 Per Dr. [name], ok to round Methotrexate  dose to 50 mg to avoid waste from partial vial.  Wilma Dollar, Pharm.D., CPP 06/13/2024@1 :11 PM

## 2024-06-14 ENCOUNTER — Inpatient Hospital Stay: Attending: Hematology and Oncology | Admitting: Hematology and Oncology

## 2024-06-14 ENCOUNTER — Inpatient Hospital Stay: Payer: Self-pay

## 2024-06-14 VITALS — BP 100/64 | HR 74 | Temp 97.6°F | Resp 18 | Ht 66.0 in | Wt 159.5 lb

## 2024-06-14 DIAGNOSIS — K5903 Drug induced constipation: Secondary | ICD-10-CM | POA: Insufficient documentation

## 2024-06-14 DIAGNOSIS — Z171 Estrogen receptor negative status [ER-]: Secondary | ICD-10-CM | POA: Insufficient documentation

## 2024-06-14 DIAGNOSIS — D701 Agranulocytosis secondary to cancer chemotherapy: Secondary | ICD-10-CM | POA: Diagnosis not present

## 2024-06-14 DIAGNOSIS — R4189 Other symptoms and signs involving cognitive functions and awareness: Secondary | ICD-10-CM | POA: Insufficient documentation

## 2024-06-14 DIAGNOSIS — Z5111 Encounter for antineoplastic chemotherapy: Secondary | ICD-10-CM | POA: Diagnosis present

## 2024-06-14 DIAGNOSIS — R5383 Other fatigue: Secondary | ICD-10-CM | POA: Diagnosis not present

## 2024-06-14 DIAGNOSIS — D6481 Anemia due to antineoplastic chemotherapy: Secondary | ICD-10-CM | POA: Diagnosis not present

## 2024-06-14 DIAGNOSIS — C50512 Malignant neoplasm of lower-outer quadrant of left female breast: Secondary | ICD-10-CM | POA: Insufficient documentation

## 2024-06-14 DIAGNOSIS — R32 Unspecified urinary incontinence: Secondary | ICD-10-CM | POA: Insufficient documentation

## 2024-06-14 LAB — CBC WITH DIFFERENTIAL (CANCER CENTER ONLY)
Abs Immature Granulocytes: 0.01 K/uL (ref 0.00–0.07)
Basophils Absolute: 0 K/uL (ref 0.0–0.1)
Basophils Relative: 0 %
Eosinophils Absolute: 0 K/uL (ref 0.0–0.5)
Eosinophils Relative: 1 %
HCT: 33.6 % — ABNORMAL LOW (ref 36.0–46.0)
Hemoglobin: 10.5 g/dL — ABNORMAL LOW (ref 12.0–15.0)
Immature Granulocytes: 0 %
Lymphocytes Relative: 47 %
Lymphs Abs: 1.4 K/uL (ref 0.7–4.0)
MCH: 25.5 pg — ABNORMAL LOW (ref 26.0–34.0)
MCHC: 31.3 g/dL (ref 30.0–36.0)
MCV: 81.6 fL (ref 80.0–100.0)
Monocytes Absolute: 0.8 K/uL (ref 0.1–1.0)
Monocytes Relative: 26 %
Neutro Abs: 0.8 K/uL — ABNORMAL LOW (ref 1.7–7.7)
Neutrophils Relative %: 26 %
Platelet Count: 212 K/uL (ref 150–400)
RBC: 4.12 MIL/uL (ref 3.87–5.11)
RDW: 16 % — ABNORMAL HIGH (ref 11.5–15.5)
WBC Count: 3 K/uL — ABNORMAL LOW (ref 4.0–10.5)
nRBC: 0 % (ref 0.0–0.2)

## 2024-06-14 LAB — CMP (CANCER CENTER ONLY)
ALT: 12 U/L (ref 0–44)
AST: 21 U/L (ref 15–41)
Albumin: 4.5 g/dL (ref 3.5–5.0)
Alkaline Phosphatase: 114 U/L (ref 38–126)
Anion gap: 9 (ref 5–15)
BUN: 22 mg/dL (ref 8–23)
CO2: 24 mmol/L (ref 22–32)
Calcium: 10.9 mg/dL — ABNORMAL HIGH (ref 8.9–10.3)
Chloride: 103 mmol/L (ref 98–111)
Creatinine: 0.62 mg/dL (ref 0.44–1.00)
GFR, Estimated: >60 mL/min
Glucose, Bld: 98 mg/dL (ref 70–99)
Potassium: 4.3 mmol/L (ref 3.5–5.1)
Sodium: 135 mmol/L (ref 135–145)
Total Bilirubin: 0.8 mg/dL (ref 0.0–1.2)
Total Protein: 7.8 g/dL (ref 6.5–8.1)

## 2024-06-14 NOTE — Progress Notes (Signed)
 "  Patient Care Team: Theo Iha, MD as PCP - General (Internal Medicine) Rutherford Gain, MD as Consulting Physician (Obstetrics and Gynecology) Theo Iha, MD (Internal Medicine) Tyree Nanetta SAILOR, RN as Oncology Nurse Navigator Gerome, Devere HERO, RN as Oncology Nurse Navigator [name] Cough, MD as Consulting Physician (General Surgery) Odean Potts, MD as Consulting Physician (Hematology and Oncology) Izell Domino, MD as Attending Physician (Radiation Oncology)  DIAGNOSIS:  Encounter Diagnosis  Name Primary?   Malignant neoplasm of lower-outer quadrant of left breast of female, estrogen receptor negative (HCC) Yes    SUMMARY OF ONCOLOGIC HISTORY: Oncology History  Malignant neoplasm of lower-outer quadrant of left breast of female, estrogen receptor negative (HCC)  01/09/2024 Initial Diagnosis   Palpable left breast mass: 1.6 cm 5 o'clock position, axilla negative, biopsy: Grade 2 IDC ER 0%, PR 0%, Ki67 30%, HER2 0   02/01/2024 Cancer Staging   Staging form: Breast, AJCC 8th Edition - Clinical: Stage IB (cT1c, cN0, cM0, G2, ER-, PR-, HER2-) - Signed by Odean Potts, MD on 02/01/2024 Stage prefix: Initial diagnosis Histologic grading system: 3 grade system   02/21/2024 Surgery   Left lumpectomy: Grade 2 IDC 2.2 cm, margins negative, ER 0%, PR 0%, HER2 negative, Ki67 30%, 0/4 lymph nodes negative   03/28/2024 -  Chemotherapy   Patient is on Treatment Plan : BREAST CMF IV q21d       CHIEF COMPLIANT: Cycle 4 CMF (being held for neutropenia)  HISTORY OF PRESENT ILLNESS:  History of Present Illness [name] is a 79 year old female with 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  She is on adjuvant CMF chemotherapy for left breast cancer and has developed  significant neutropenia with ANC 0.8 and anemia with hemoglobin decreasing from nearly 12 to 10.5.  She reports persistent fatigue with weakness and lightheadedness that limits daily activities and fluctuates but persists throughout the three-week chemotherapy cycle, with only brief improvement. She maintains adequate nutrition without dysgeusia, anorexia, or meaningful weight loss.  She has recurrent constipation at the start of each chemotherapy cycle, requiring ongoing use of Colace and dietary measures such as prune juice, teas, and hot water with lemon to maintain bowel regularity.      ALLERGIES:  has no active allergies.  MEDICATIONS:  Current Outpatient Medications  Medication Sig Dispense Refill   acetaminophen  (TYLENOL ) 500 MG tablet Take 1,000 mg by mouth every 6 (six) hours as needed for moderate pain. (Patient not taking: Reported on 03/28/2024)     Ascorbic Acid (VITAMIN C) 1000 MG tablet Take 1,000 mg by mouth daily. (Patient not taking: Reported on 03/28/2024)     b complex vitamins tablet Take 1 tablet by mouth daily. (Patient not taking: Reported on 03/28/2024)     BLACK CURRANT SEED OIL PO Take by mouth. (Patient not taking: Reported on 03/28/2024)     Cholecalciferol (VITAMIN D3) 50 MCG (2000 UT) capsule Vitamin D3 (Patient not taking: Reported on 03/28/2024)     dexamethasone  (DECADRON ) 4 MG tablet Take 1 tablet (4 mg total) by mouth daily. Start the day after chemotherapy for 2 days. Take with food. 12 tablet 0   Ginkgo Biloba (GINKOBA PO) Take 1 tablet by mouth daily. (Patient not taking: Reported on 03/28/2024)     Glucosamine HCl (GLUCOSAMINE PO) Take 3 capsules by mouth daily. (Patient not taking: Reported on 03/28/2024)  Iron-Vitamin C (IRON 100/C) 100-250 MG TABS Take 1 capsule by mouth daily.  (Patient not taking: Reported on 03/28/2024)     loratadine (CLARITIN) 10 MG tablet Take 10 mg by mouth daily.     Lotilaner (XDEMVY) 0.25 % SOLN Apply to eye. (Patient  not taking: Reported on 03/28/2024)     MAGNESIUM GLYCINATE PLUS PO Take 400 mg by mouth daily.  (Patient not taking: Reported on 03/28/2024)     ondansetron  (ZOFRAN ) 8 MG tablet Take 1 tablet (8 mg total) by mouth every 8 (eight) hours as needed for nausea or vomiting. Start on the third day after chemotherapy. 30 tablet 1   Polyvinyl Alcohol-Povidone (REFRESH OP) Apply 1 drop to eye daily as needed (dry eyes). (Patient not taking: Reported on 03/28/2024)     Probiotic Product (PROBIOTIC & ACIDOPHILUS EX ST PO) Take 1 tablet by mouth daily.  (Patient not taking: Reported on 03/28/2024)     prochlorperazine  (COMPAZINE ) 10 MG tablet Take 1 tablet (10 mg total) by mouth every 6 (six) hours as needed for nausea or vomiting. 30 tablet 1   Protein POWD Take 1 scoop by mouth daily. Mix with water (Patient not taking: Reported on 03/28/2024)     traMADol  (ULTRAM ) 50 MG tablet Take 1 tablet (50 mg total) by mouth every 6 (six) hours as needed. (Patient not taking: Reported on 03/28/2024) 10 tablet 0   trimethoprim-polymyxin b (POLYTRIM) ophthalmic solution every 4 (four) hours. (Patient not taking: Reported on 03/28/2024)     No current facility-administered medications for this visit.    PHYSICAL EXAMINATION: ECOG PERFORMANCE STATUS: 1 - Symptomatic but completely ambulatory  Vitals:   06/14/24 1151  BP: 100/64  Pulse: 74  Resp: 18  Temp: 97.6 F (36.4 C)  SpO2: 100%   Filed Weights   06/14/24 1151  Weight: 159 lb 8 oz (72.3 kg)    LABORATORY DATA:  I have reviewed the data as listed    Latest Ref Rng & Units 06/14/2024   11:19 AM 05/17/2024    9:54 AM 05/10/2024    1:27 PM  CMP  Glucose 70 - 99 mg/dL 98  894  899   BUN 8 - 23 mg/dL 22  17  13    Creatinine 0.44 - 1.00 mg/dL 9.37  9.29  9.35   Sodium 135 - 145 mmol/L 135  140  138   Potassium 3.5 - 5.1 mmol/L 4.3  4.5  4.4   Chloride 98 - 111 mmol/L 103  107  104   CO2 22 - 32 mmol/L 24  23  26    Calcium 8.9 - 10.3 mg/dL 89.0  89.9   89.6   Total Protein 6.5 - 8.1 g/dL 7.8  7.7  7.6   Total Bilirubin 0.0 - 1.2 mg/dL 0.8  0.3  0.3   Alkaline Phos 38 - 126 U/L 114  137  115   AST 15 - 41 U/L 21  27  21    ALT 0 - 44 U/L 12  15  9      Lab Results  Component Value Date   WBC 3.0 (L) 06/14/2024   HGB 10.5 (L) 06/14/2024   HCT 33.6 (L) 06/14/2024   MCV 81.6 06/14/2024   PLT 212 06/14/2024   NEUTROABS 0.8 (L) 06/14/2024    ASSESSMENT & PLAN:  Malignant neoplasm of lower-outer quadrant of left breast of female, estrogen receptor negative (HCC) 01/09/24: Palpable left breast mass: 1.6 cm 5 o'clock position, axilla negative, biopsy: Grade  2 IDC ER 0%, PR 0%, Ki67 30%, HER2 0   Treatment plan: 02/21/2024: Left lumpectomy: Grade 2 IDC 2.2 cm, margins negative, ER 0%, PR 0%, HER2 negative, Ki67 30%, 0/4 lymph nodes negative Adjuvant chemotherapy with CMF x 6 cycles Adjuvant radiation therapy ------------------------------------------------------------------------------------------------------------------------------------------ Current Treatment: Cycle 4 CMF (being held for neutropenia)  Chemo toxicities: Fatigue: Lasted a whole week Leukopenia/neutropenia: ANC 0.8 (chemo being held for neutropenia).  This is in spite of getting Neulasta . Mild anemia Constipation: Encouraged her to take stool softeners regularly and then laxatives as needed Urinary incontinence x 3 episodes: we discontinued steroids.  I discussed with her about stopping after 4 cycles of chemo. Return to clinic in 1 week to reassess her labs and see if she can receive fourth cycle.   Assessment & Plan Malignant neoplasm of lower-outer quadrant of left breast, estrogen receptor negative  Cumulative chemotherapy toxicity limits tolerance.  Radiation therapy planned post-chemotherapy for local control and recurrence risk reduction. - Remove additional chemotherapy appointments beyond the next cycle; reassess chemotherapy tolerance after the next  cycle. - Return in 1 week for reassessment and possible rescheduling of chemotherapy.  Chemotherapy-induced neutropenia Recurrent neutropenia with ANC persistently <1.0 despite dose reductions and prior Neulasta  support, necessitating chemotherapy holds and delays. Neutropenia likely contributes to fatigue and malaise. Expected recovery within 4-6 weeks after chemotherapy completion. - Hold chemotherapy this week due to ANC <1.0; delay next cycle by at least 1 week. - Postpone Neulasta  booster scheduled for Saturday; administer after next chemotherapy cycle if ANC permits. - Monitor blood counts and reassess prior to resuming chemotherapy. - Educated regarding the temporary nature of neutropenia and expected recovery within 4-6 weeks post-chemotherapy.  Chemotherapy-induced anemia Mild anemia with hemoglobin decreased from nearly 12 to 10.5, likely contributing to fatigue and weakness.  Chemotherapy-induced constipation Constipation persists, particularly at the start of each chemotherapy cycle, but remains manageable with current regimen. - Continue current bowel regimen including docusate, prune juice, teas, hot water, and lemon water as needed.      No orders of the defined types were placed in this encounter.  The patient has a good understanding of the overall plan. she agrees with it. she will call with any problems that may develop before the next visit here.  I personally spent a total of 30 minutes in the care of the patient today including preparing to see the patient, getting/reviewing separately obtained history, performing a medically appropriate exam/evaluation, counseling and educating, placing orders, referring and communicating with other health care professionals, documenting clinical information in the EHR, independently interpreting results, communicating results, and coordinating care.   Viinay K Yamna Mackel, MD 06/14/2024    "

## 2024-06-14 NOTE — Assessment & Plan Note (Signed)
 01/09/24: Palpable left breast mass: 1.6 cm 5 o'clock position, axilla negative, biopsy: Grade 2 IDC ER 0%, PR 0%, Ki67 30%, HER2 0   Treatment plan: 02/21/2024: Left lumpectomy: Grade 2 IDC 2.2 cm, margins negative, ER 0%, PR 0%, HER2 negative, Ki67 30%, 0/4 lymph nodes negative Adjuvant chemotherapy with CMF x 6 cycles Adjuvant radiation therapy ------------------------------------------------------------------------------------------------------------------------------------------ Current Treatment: Cycle 4 CMF  Chemo toxicities: Fatigue: Lasted a whole week Leukopenia/neutropenia: ANC 1.1 at the start of chemo.  Today's ANC is still 1.4. Mild anemia Constipation: Encouraged her to take stool softeners regularly and then laxatives as needed Urinary incontinence x 3 episodes: we discontinued steroids.  Return to clinic in 3 weeks for cycle 5

## 2024-06-16 ENCOUNTER — Inpatient Hospital Stay: Payer: Self-pay

## 2024-06-21 ENCOUNTER — Inpatient Hospital Stay

## 2024-06-21 ENCOUNTER — Inpatient Hospital Stay (HOSPITAL_BASED_OUTPATIENT_CLINIC_OR_DEPARTMENT_OTHER): Admitting: Hematology and Oncology

## 2024-06-21 ENCOUNTER — Other Ambulatory Visit: Payer: Self-pay | Admitting: *Deleted

## 2024-06-21 VITALS — BP 133/60 | HR 80 | Temp 97.6°F | Resp 18 | Ht 66.0 in | Wt 161.0 lb

## 2024-06-21 DIAGNOSIS — Z171 Estrogen receptor negative status [ER-]: Secondary | ICD-10-CM

## 2024-06-21 DIAGNOSIS — Z5111 Encounter for antineoplastic chemotherapy: Secondary | ICD-10-CM | POA: Diagnosis not present

## 2024-06-21 DIAGNOSIS — C50512 Malignant neoplasm of lower-outer quadrant of left female breast: Secondary | ICD-10-CM | POA: Diagnosis not present

## 2024-06-21 LAB — CBC WITH DIFFERENTIAL (CANCER CENTER ONLY)
Abs Immature Granulocytes: 0.01 K/uL (ref 0.00–0.07)
Basophils Absolute: 0 K/uL (ref 0.0–0.1)
Basophils Relative: 0 %
Eosinophils Absolute: 0 K/uL (ref 0.0–0.5)
Eosinophils Relative: 1 %
HCT: 33.2 % — ABNORMAL LOW (ref 36.0–46.0)
Hemoglobin: 10.3 g/dL — ABNORMAL LOW (ref 12.0–15.0)
Immature Granulocytes: 0 %
Lymphocytes Relative: 43 %
Lymphs Abs: 1.9 K/uL (ref 0.7–4.0)
MCH: 25.7 pg — ABNORMAL LOW (ref 26.0–34.0)
MCHC: 31 g/dL (ref 30.0–36.0)
MCV: 82.8 fL (ref 80.0–100.0)
Monocytes Absolute: 0.9 K/uL (ref 0.1–1.0)
Monocytes Relative: 20 %
Neutro Abs: 1.6 K/uL — ABNORMAL LOW (ref 1.7–7.7)
Neutrophils Relative %: 36 %
Platelet Count: 281 K/uL (ref 150–400)
RBC: 4.01 MIL/uL (ref 3.87–5.11)
RDW: 16.2 % — ABNORMAL HIGH (ref 11.5–15.5)
WBC Count: 4.3 K/uL (ref 4.0–10.5)
nRBC: 0 % (ref 0.0–0.2)

## 2024-06-21 MED ORDER — FLUOROURACIL CHEMO INJECTION 2.5 GM/50ML
400.0000 mg/m2 | Freq: Once | INTRAVENOUS | Status: AC
  Administered 2024-06-21: 750 mg via INTRAVENOUS
  Filled 2024-06-21: qty 15

## 2024-06-21 MED ORDER — DEXAMETHASONE SOD PHOSPHATE PF 10 MG/ML IJ SOLN
10.0000 mg | Freq: Once | INTRAMUSCULAR | Status: AC
  Administered 2024-06-21: 10 mg via INTRAVENOUS
  Filled 2024-06-21: qty 1

## 2024-06-21 MED ORDER — SODIUM CHLORIDE 0.9 % IV SOLN
500.0000 mg/m2 | Freq: Once | INTRAVENOUS | Status: AC
  Administered 2024-06-21: 1000 mg via INTRAVENOUS
  Filled 2024-06-21: qty 50

## 2024-06-21 MED ORDER — SODIUM CHLORIDE 0.9 % IV SOLN: INTRAVENOUS | Status: DC

## 2024-06-21 MED ORDER — METHOTREXATE SODIUM CHEMO INJECTION (PF) 50 MG/2ML
28.9000 mg/m2 | Freq: Once | INTRAMUSCULAR | Status: AC
  Administered 2024-06-21: 50 mg via INTRAVENOUS
  Filled 2024-06-21: qty 2

## 2024-06-21 MED ORDER — SODIUM CHLORIDE 0.9% FLUSH: 10.0000 mL | INTRAVENOUS | Status: DC | PRN

## 2024-06-21 MED ORDER — PALONOSETRON HCL INJECTION 0.25 MG/5ML
0.2500 mg | Freq: Once | INTRAVENOUS | Status: AC
  Administered 2024-06-21: 0.25 mg via INTRAVENOUS
  Filled 2024-06-21: qty 5

## 2024-06-21 NOTE — Progress Notes (Signed)
 "  Patient Care Team: Theo Iha, MD as PCP - General (Internal Medicine) Rutherford Gain, MD as Consulting Physician (Obstetrics and Gynecology) Theo Iha, MD (Internal Medicine) Tyree Nanetta SAILOR, RN as Oncology Nurse Navigator Gerome, Devere HERO, RN as Oncology Nurse Navigator [name] Cough, MD as Consulting Physician (General Surgery) Odean Potts, MD as Consulting Physician (Hematology and Oncology) Izell Domino, MD as Attending Physician (Radiation Oncology)  DIAGNOSIS:  Encounter Diagnosis  Name Primary?   Malignant neoplasm of lower-outer quadrant of left breast of female, estrogen receptor negative (HCC) Yes    SUMMARY OF ONCOLOGIC HISTORY: Oncology History  Malignant neoplasm of lower-outer quadrant of left breast of female, estrogen receptor negative (HCC)  01/09/2024 Initial Diagnosis   Palpable left breast mass: 1.6 cm 5 o'clock position, axilla negative, biopsy: Grade 2 IDC ER 0%, PR 0%, Ki67 30%, HER2 0   02/01/2024 Cancer Staging   Staging form: Breast, AJCC 8th Edition - Clinical: Stage IB (cT1c, cN0, cM0, G2, ER-, PR-, HER2-) - Signed by Odean Potts, MD on 02/01/2024 Stage prefix: Initial diagnosis Histologic grading system: 3 grade system   02/21/2024 Surgery   Left lumpectomy: Grade 2 IDC 2.2 cm, margins negative, ER 0%, PR 0%, HER2 negative, Ki67 30%, 0/4 lymph nodes negative   03/28/2024 -  Chemotherapy   Patient is on Treatment Plan : BREAST CMF IV q21d       CHIEF COMPLIANT: Cycle 4 CMF  HISTORY OF PRESENT ILLNESS:   History of Present Illness [name] is a 79 year old female with stage IB 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  She completed four cycles of adjuvant CMF and has been off chemotherapy for one week with only minimal improvement in  symptoms. She reports persistent neutropenia and anemia with cumulative severe fatigue and fluctuating day-to-day well-being.  She continues to have significant cognitive impairment with brain fog and memory deficits that have not improved and remain distressing. Fatigue is pronounced and ongoing. Appetite is poor with intermittent nausea. She denies alopecia.  Recent labs show improving leukopenia and neutropenia but values remain below normal. Platelets are stable. Anemia persists four weeks after the last chemotherapy cycle.       ALLERGIES:  has no active allergies.  MEDICATIONS:  Current Outpatient Medications  Medication Sig Dispense Refill   acetaminophen  (TYLENOL ) 500 MG tablet Take 1,000 mg by mouth every 6 (six) hours as needed for moderate pain.     Ascorbic Acid (VITAMIN C) 1000 MG tablet Take 1,000 mg by mouth daily.     b complex vitamins tablet Take 1 tablet by mouth daily.     BLACK CURRANT SEED OIL PO Take by mouth.     Cholecalciferol (VITAMIN D3) 50 MCG (2000 UT) capsule Vitamin D3     dexamethasone  (DECADRON ) 4 MG tablet Take 1 tablet (4 mg total) by mouth daily. Start the day after chemotherapy for 2 days. Take with food. 12 tablet 0   Ginkgo Biloba (GINKOBA PO) Take 1 tablet by mouth daily.     Glucosamine HCl (GLUCOSAMINE PO) Take 3 capsules by mouth daily.     Iron-Vitamin C (IRON 100/C) 100-250 MG TABS Take 1 capsule by mouth daily.      loratadine (CLARITIN) 10 MG tablet Take 10 mg by mouth daily.     Lotilaner (XDEMVY) 0.25 % SOLN Apply to eye.     MAGNESIUM GLYCINATE PLUS PO  Take 400 mg by mouth daily.      ondansetron  (ZOFRAN ) 8 MG tablet Take 1 tablet (8 mg total) by mouth every 8 (eight) hours as needed for nausea or vomiting. Start on the third day after chemotherapy. 30 tablet 1   Polyvinyl Alcohol-Povidone (REFRESH OP) Apply 1 drop to eye daily as needed (dry eyes).     Probiotic Product (PROBIOTIC & ACIDOPHILUS EX ST PO) Take 1 tablet by mouth daily.       prochlorperazine  (COMPAZINE ) 10 MG tablet Take 1 tablet (10 mg total) by mouth every 6 (six) hours as needed for nausea or vomiting. 30 tablet 1   Protein POWD Take 1 scoop by mouth daily. Mix with water     traMADol  (ULTRAM ) 50 MG tablet Take 1 tablet (50 mg total) by mouth every 6 (six) hours as needed. 10 tablet 0   trimethoprim-polymyxin b (POLYTRIM) ophthalmic solution every 4 (four) hours.     No current facility-administered medications for this visit.    PHYSICAL EXAMINATION: ECOG PERFORMANCE STATUS: 1 - Symptomatic but completely ambulatory  Vitals:   06/21/24 1425  BP: 133/60  Pulse: 80  Resp: 18  Temp: 97.6 F (36.4 C)  SpO2: 100%   Filed Weights   06/21/24 1425  Weight: 161 lb (73 kg)      LABORATORY DATA:  I have reviewed the data as listed    Latest Ref Rng & Units 06/14/2024   11:19 AM 05/17/2024    9:54 AM 05/10/2024    1:27 PM  CMP  Glucose 70 - 99 mg/dL 98  894  899   BUN 8 - 23 mg/dL 22  17  13    Creatinine 0.44 - 1.00 mg/dL 9.37  9.29  9.35   Sodium 135 - 145 mmol/L 135  140  138   Potassium 3.5 - 5.1 mmol/L 4.3  4.5  4.4   Chloride 98 - 111 mmol/L 103  107  104   CO2 22 - 32 mmol/L 24  23  26    Calcium 8.9 - 10.3 mg/dL 89.0  89.9  89.6   Total Protein 6.5 - 8.1 g/dL 7.8  7.7  7.6   Total Bilirubin 0.0 - 1.2 mg/dL 0.8  0.3  0.3   Alkaline Phos 38 - 126 U/L 114  137  115   AST 15 - 41 U/L 21  27  21    ALT 0 - 44 U/L 12  15  9      Lab Results  Component Value Date   WBC 4.3 06/21/2024   HGB 10.3 (L) 06/21/2024   HCT 33.2 (L) 06/21/2024   MCV 82.8 06/21/2024   PLT 281 06/21/2024   NEUTROABS 1.6 (L) 06/21/2024    ASSESSMENT & PLAN:  Malignant neoplasm of lower-outer quadrant of left breast of female, estrogen receptor negative (HCC) 01/09/24: Palpable left breast mass: 1.6 cm 5 o'clock position, axilla negative, biopsy: Grade 2 IDC ER 0%, PR 0%, Ki67 30%, HER2 0   Treatment plan: 02/21/2024: Left lumpectomy: Grade 2 IDC 2.2 cm, margins  negative, ER 0%, PR 0%, HER2 negative, Ki67 30%, 0/4 lymph nodes negative Adjuvant chemotherapy with CMF x 6 cycles Adjuvant radiation therapy ------------------------------------------------------------------------------------------------------------------------------------------ Current Treatment: Cycle 4 CMF   Chemo toxicities: Fatigue: Lasted a whole week Leukopenia/neutropenia: ANC 0.8 (chemo being held for neutropenia).  This is in spite of getting Neulasta . Mild anemia Constipation: Encouraged her to take stool softeners regularly and then laxatives as needed Urinary incontinence x 3 episodes: we  discontinued steroids.   I discussed with her about stopping after 4 cycles of chemo. ------------------------------------- Assessment and Plan Assessment & Plan Chemotherapy-induced neutropenia Ongoing post-chemotherapy monitoring for cytopenia recovery. - Administered final booster (growth factor) to support neutrophil recovery and reduce infection risk. - No further booster planned after this cycle. - Monitored blood counts during post-chemotherapy recovery and prior to radiation therapy. - Provided education on infection precautions until counts fully normalize.  Chemotherapy-induced anemia Persistent anemia four weeks post-chemotherapy, with low hemoglobin and slow anticipated recovery. Fatigue and weakness likely multifactorial, including anemia and chemotherapy effect. Recovery may take several months. - Monitored hemoglobin and blood counts during post-chemotherapy recovery and prior to radiation therapy. - Advised that anemia recovery may be prolonged and will be monitored during follow-up.  Chemotherapy-induced fatigue and cognitive impairment Ongoing significant fatigue and cognitive impairment post-chemotherapy, expected to improve gradually but may persist up to six months. Symptoms likely multifactorial, including cytopenias and chemotherapy effect. Supportive measures and  time are primary management. Potential eligibility for clinical trial for cognitive impairment if symptoms persist at future follow-up. - Recommended trial of sublingual vitamin B12 supplementation to address fatigue, with permission to start immediately. - Advised to wait one month before resuming other supplements; further guidance to be provided regarding supplement use during radiation therapy. - Discussed expected gradual improvement of fatigue and cognitive symptoms, which may take several months. - Offered potential enrollment in clinical trial for cognitive impairment if symptoms persist at future follow-up. - Scheduled survivorship visit to address memory, cognitive health, and supportive care strategies.      No orders of the defined types were placed in this encounter.  The patient has a good understanding of the overall plan. she agrees with it. she will call with any problems that may develop before the next visit here.  I personally spent a total of 30 minutes in the care of the patient today including preparing to see the patient, getting/reviewing separately obtained history, performing a medically appropriate exam/evaluation, counseling and educating, placing orders, referring and communicating with other health care professionals, documenting clinical information in the EHR, independently interpreting results, communicating results, and coordinating care.   Viinay K Americo Vallery, MD 06/21/24    "

## 2024-06-21 NOTE — Patient Instructions (Signed)
 [facility] - A DEPT OF [facility]  Discharge Instructions: Thank you for choosing [facility] to provide your oncology and hematology care.   If you have a lab appointment with the Cancer Center, please go directly to the Cancer Center and check in at the registration area.   Wear comfortable clothing and clothing appropriate for easy access to any Portacath or PICC line.   We strive to give you quality time with your provider. You may need to reschedule your appointment if you arrive late (15 or more minutes).  Arriving late affects you and other patients whose appointments are after yours.  Also, if you miss three or more appointments without notifying the office, you may be dismissed from the clinic at the providers discretion.      For prescription refill requests, have your pharmacy contact our office and allow 72 hours for refills to be completed.    Today you received the following chemotherapy and/or immunotherapy agents cytoxan  methotrexate  adrucil       To help prevent nausea and vomiting after your treatment, we encourage you to take your nausea medication as directed.  BELOW ARE SYMPTOMS THAT SHOULD BE REPORTED IMMEDIATELY: *FEVER GREATER THAN 100.4 F (38 C) OR HIGHER *CHILLS OR SWEATING *NAUSEA AND VOMITING THAT IS NOT CONTROLLED WITH YOUR NAUSEA MEDICATION *UNUSUAL SHORTNESS OF BREATH *UNUSUAL BRUISING OR BLEEDING *URINARY PROBLEMS (pain or burning when urinating, or frequent urination) *BOWEL PROBLEMS (unusual diarrhea, constipation, pain near the anus) TENDERNESS IN MOUTH AND THROAT WITH OR WITHOUT PRESENCE OF ULCERS (sore throat, sores in mouth, or a toothache) UNUSUAL RASH, SWELLING OR PAIN  UNUSUAL VAGINAL DISCHARGE OR ITCHING   Items with * indicate a potential emergency and should be followed up as soon as possible or go to the Emergency Department if any problems should occur.  Please show the CHEMOTHERAPY ALERT CARD  or IMMUNOTHERAPY ALERT CARD at check-in to the Emergency Department and triage nurse.  Should you have questions after your visit or need to cancel or reschedule your appointment, please contact [facility] - A DEPT OF [facility]  Dept: [phone]  and follow the prompts.  Office hours are 8:00 a.m. to 4:30 p.m. Monday - Friday. Please note that voicemails left after 4:00 p.m. may not be returned until the following business day.  We are closed weekends and major holidays. You have access to a nurse at all times for urgent questions. Please call the main number to the clinic Dept: [phone] and follow the prompts.   For any non-urgent questions, you may also contact your provider using MyChart. We now offer e-Visits for anyone 67 and older to request care online for non-urgent symptoms. For details visit [email or web address].   Also download the MyChart app! Go to the app store, search MyChart, open the app, select [facility], and log in with your MyChart username and password.

## 2024-06-21 NOTE — Progress Notes (Signed)
 Per MD okay to treat with CMP from 06/14/24.

## 2024-06-21 NOTE — Assessment & Plan Note (Signed)
 01/09/24: Palpable left breast mass: 1.6 cm 5 o'clock position, axilla negative, biopsy: Grade 2 IDC ER 0%, PR 0%, Ki67 30%, HER2 0   Treatment plan: 02/21/2024: Left lumpectomy: Grade 2 IDC 2.2 cm, margins negative, ER 0%, PR 0%, HER2 negative, Ki67 30%, 0/4 lymph nodes negative Adjuvant chemotherapy with CMF x 6 cycles Adjuvant radiation therapy ------------------------------------------------------------------------------------------------------------------------------------------ Current Treatment: Cycle 4 CMF   Chemo toxicities: Fatigue: Lasted a whole week Leukopenia/neutropenia: ANC 0.8 (chemo being held for neutropenia).  This is in spite of getting Neulasta . Mild anemia Constipation: Encouraged her to take stool softeners regularly and then laxatives as needed Urinary incontinence x 3 episodes: we discontinued steroids.   I discussed with her about stopping after 4 cycles of chemo.

## 2024-06-22 ENCOUNTER — Encounter: Payer: Self-pay | Admitting: *Deleted

## 2024-06-22 DIAGNOSIS — Z171 Estrogen receptor negative status [ER-]: Secondary | ICD-10-CM

## 2024-06-23 ENCOUNTER — Inpatient Hospital Stay

## 2024-06-23 VITALS — BP 114/63 | HR 74 | Temp 97.8°F | Resp 17

## 2024-06-23 DIAGNOSIS — C50512 Malignant neoplasm of lower-outer quadrant of left female breast: Secondary | ICD-10-CM

## 2024-06-23 DIAGNOSIS — Z5111 Encounter for antineoplastic chemotherapy: Secondary | ICD-10-CM | POA: Diagnosis not present

## 2024-06-23 MED ORDER — PEGFILGRASTIM-CBQV 6 MG/0.6ML ~~LOC~~ SOSY
6.0000 mg | PREFILLED_SYRINGE | Freq: Once | SUBCUTANEOUS | Status: AC
  Administered 2024-06-23: 6 mg via SUBCUTANEOUS
  Filled 2024-06-23: qty 0.6

## 2024-06-27 ENCOUNTER — Inpatient Hospital Stay

## 2024-06-27 ENCOUNTER — Inpatient Hospital Stay: Admitting: Hematology and Oncology

## 2024-06-28 NOTE — Progress Notes (Incomplete)
 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, Estrogen Receptor Negative   Histology per Pathology Report:    Receptor Status: ER(0%), PR (0%), Her2-neu (Negative), Ki-67(30%)  Did patient present with symptoms (if so, please note symptoms) or was this found on screening mammography?:  01/06/2024 Mammogram   Past/Anticipated interventions by surgeon, if any: [name], MD 02/21/2024 Breast Lumpectomy with Radioactive Seed and Sentinel Lymph Node Biopsy   Past/Anticipated interventions by medical oncology, if any:  [name], MD 06/21/2024  Lymphedema issues, if any:  {:18581} {t:21944}   Pain issues, if any:  {:18581} {PAIN DESCRIPTION:21022940}  SAFETY ISSUES: Prior radiation? {:18581} Pacemaker/ICD? {:18581} Possible current pregnancy?{:18581} Is the patient on methotrexate ? {:18581}  Current Complaints / other details:  ***

## 2024-07-02 ENCOUNTER — Encounter: Payer: Self-pay | Admitting: *Deleted

## 2024-07-05 ENCOUNTER — Inpatient Hospital Stay

## 2024-07-05 ENCOUNTER — Inpatient Hospital Stay: Admitting: Hematology and Oncology

## 2024-07-07 ENCOUNTER — Inpatient Hospital Stay

## 2024-07-11 ENCOUNTER — Ambulatory Visit: Admitting: Radiation Oncology

## 2024-07-11 ENCOUNTER — Ambulatory Visit

## 2024-07-11 NOTE — Progress Notes (Incomplete)
 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, Estrogen Receptor Negative   Histology per Pathology Report:    Receptor Status: ER(0%), PR (0%), Her2-neu (Negative), Ki-67(30%)  Did patient present with symptoms (if so, please note symptoms) or was this found on screening mammography?:  01/06/2024 Mammogram   Past/Anticipated interventions by surgeon, if any: [name], MD 02/21/2024 Breast Lumpectomy with Radioactive Seed and Sentinel Lymph Node Biopsy   Past/Anticipated interventions by medical oncology, if any:  [name], MD 06/21/2024  Lymphedema issues, if any:  {:18581} {t:21944}   Pain issues, if any:  {:18581} {PAIN DESCRIPTION:21022940}  SAFETY ISSUES: Prior radiation? {:18581} Pacemaker/ICD? {:18581} Possible current pregnancy?{:18581} Is the patient on methotrexate ? {:18581}  Current Complaints / other details:  ***

## 2024-07-13 ENCOUNTER — Ambulatory Visit: Admitting: Radiation Oncology

## 2024-07-18 ENCOUNTER — Inpatient Hospital Stay

## 2024-07-18 ENCOUNTER — Inpatient Hospital Stay: Admitting: Hematology and Oncology

## 2024-07-20 ENCOUNTER — Ambulatory Visit: Admitting: Radiation Oncology

## 2024-07-20 ENCOUNTER — Ambulatory Visit

## 2024-07-26 ENCOUNTER — Inpatient Hospital Stay: Admitting: Hematology and Oncology

## 2024-07-26 ENCOUNTER — Inpatient Hospital Stay

## 2024-07-28 ENCOUNTER — Inpatient Hospital Stay
